# Patient Record
Sex: Male | Born: 1990 | Race: Black or African American | Hispanic: No | Marital: Single | Smoking: Never smoker
Health system: Southern US, Community
[De-identification: ages and names within clinical notes are randomized; demographics above are authoritative.]

## PROBLEM LIST (undated history)

## (undated) DIAGNOSIS — F840 Autistic disorder: Secondary | ICD-10-CM

## (undated) DIAGNOSIS — R569 Unspecified convulsions: Secondary | ICD-10-CM

## (undated) DIAGNOSIS — F419 Anxiety disorder, unspecified: Secondary | ICD-10-CM

---
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
## 2010-08-11 LAB — COMPREHENSIVE METABOLIC PANEL
ALT: 14 U/L (ref 0–35)
Alkaline Phosphatase: 77 U/L (ref 39–117)
BUN: 8 mg/dL (ref 6–23)
Calcium: 9.4 mg/dL (ref 8.4–10.5)
Chloride: 107 mEq/L (ref 96–112)
Creatinine, Ser: 0.64 mg/dL (ref 0.4–1.2)
Glucose, Bld: 90 mg/dL (ref 70–99)
Potassium: 4.1 mEq/L (ref 3.5–5.1)
Sodium: 139 mEq/L (ref 135–145)

## 2014-12-11 NOTE — H&P (Signed)
  Paul Hendricks is an 24 y.o. male.   Chief Complaint: "Wisdom Teeth" HPI: Paul Hendricks is a 24 year old male with a history of Autism referred to our office to have his impacted wisdom teeth removed.    PMHx: No past medical history on file.  Mood Disorder, Autism, and Asphasia   PSx: No past surgical history on file.  Family Hx: No family history on file.  Social History:  has no tobacco, alcohol, and drug history on file.  Allergies: Allergies not on file  Meds:  No prescriptions prior to admission    Labs: No results found for this or any previous visit (from the past 48 hour(s)).  Radiology: No results found.  ROS: Pertinent items are noted in HPI.  Vitals: There were no vitals taken for this visit.  Physical Exam: General appearance: alert, appears stated age, mild distress, slowed mentation and uncooperative Head: Normocephalic, without obvious abnormality, atraumatic Eyes: conjunctivae/corneas clear. PERRL, EOM's intact. Fundi benign. Ears: normal TM's and external ear canals both ears Nose: Nares normal. Septum midline. Mucosa normal. No drainage or sinus tenderness. Throat: lips, mucosa, and tongue normal; teeth and gums normal and impacted third molars #17 and #32.  non-functional #1 and #16 Resp: clear to auscultation bilaterally Cardio: regular rate and rhythm, S1, S2 normal, no murmur, click, rub or gallop Extremities: extremities normal, atraumatic, no cyanosis or edema Skin: Skin color, texture, turgor normal. No rashes or lesions Neurologic: Alert and oriented X 3, normal strength and tone. Normal symmetric reflexes. Normal coordination and gait The examination was limited due to the patient's level of cooperation.  Assessment/Plan Paul Hendricks is a 24 year old male with Autism with non-functional #1, 16 and partial bony impacted #17, 32.  We will take the patient to the OR for removal of teeth #1, 16, 17, and #32.    [place],Sheanna Dail L  12/11/2014, 4:44  PM

## 2014-12-19 ENCOUNTER — Encounter (HOSPITAL_COMMUNITY)
Admission: RE | Admit: 2014-12-19 | Discharge: 2014-12-19 | Disposition: A | Discharge status: Home / Self Care | Payer: Medicaid Other | Source: Ambulatory Visit | Attending: Oral and Maxillofacial Surgery | Admitting: Oral and Maxillofacial Surgery

## 2014-12-19 ENCOUNTER — Encounter (HOSPITAL_COMMUNITY): Payer: Self-pay

## 2014-12-19 DIAGNOSIS — Z01818 Encounter for other preprocedural examination: Secondary | ICD-10-CM | POA: Diagnosis present

## 2014-12-19 HISTORY — DX: Unspecified convulsions: R56.9

## 2014-12-19 HISTORY — DX: Anxiety disorder, unspecified: F41.9

## 2014-12-19 HISTORY — DX: Autistic disorder: F84.0

## 2014-12-19 NOTE — Pre-Procedure Instructions (Signed)
Rashun Grattan  12/19/2014     No Pharmacies Listed   Your procedure is scheduled on Monday, August 1st.   Report to [facility] Admitting at 5:30 AM   Call this number if you have problems the morning of surgery:  [phone]   Remember:  Do not eat food or drink liquids after midnight Sunday.  Take these medicines the morning of surgery with A SIP OF WATER     Do not wear jewelry - no rings or watches.  Do not wear lotions or colognes.   You may NOT wear deodorant the day of surgery.             Men may shave face and neck.   Do not bring valuables to the hospital.  [facility] is not responsible for any belongings or valuables.  Contacts, dentures or bridgework may not be worn into surgery.  Leave your suitcase in the car.  After surgery it may be brought to your room.  Patients discharged the day of surgery will not be allowed to drive home.   Name and phone number of your driver:     Special instructions:  "Preparing for Surgery" instruction sheet.  Please read over the following fact sheets that you were given. Pain Booklet and Surgical Site Infection Prevention

## 2014-12-19 NOTE — Progress Notes (Addendum)
Patient is at [place] [facility] [place]. Has Autism, behavioral problems, can become somewhat combative.  We attempted to get blood sample for CBC, however, patient was resistent.  Caretakers: [name] (she will be with patient on DOS) and Mr. Marilu Favre [phone]) Mother is Conard Novak [phone]) and Father Curt Jews [phone]) Parents have guardianship of [place]. I have tried calling Ms. Torres and left a message for her to call back so we may get a verbal consent for surgery.   Ms. Caralee Ates stated that the mother is aware of upcoming surgery and  will be coming.   Dentist is Dr. Billey Chang PCP is Dr. Tally Joe [phone] Lindaann Pascal, PA last saw patient 06/2014 I have gathered various info, such as drug list, "individual support plan", "Letter of Appointment Guardian of the Person", Referral [facility]. I am attempting to get H & P from Dr. Dagoberto Reef office, spoke with Elmarie Shiley who is faxing last H & P.  3:05 pm

## 2014-12-21 NOTE — Op Note (Signed)
12/23/2014  4:49 PM  PATIENT:  Paul Hendricks  24 y.o. male  PRE-OPERATIVE DIAGNOSIS:  INSUFFICIENT SPACE FOR ERUPTION   POST-OPERATIVE DIAGNOSIS:  INSUFFICIENT SPACE FOR ERUPTION, NON-FUNCTIONAL /MALPOSITIONED #1, 16, PARTIAL BONY IMPACTED #17 AND #32  INDICATIONS FOR PROCEDURE: The patient is a 24 year old male with a history significant for Autism.  The patient was referred for extraction of third molars.  Due to the patient's level of cooperation he will require general anesthesia in a hospital setting.  PROCEDURE:  Procedure(s): EXTRACTION  OF TEETH [phone]  SURGEON:  Surgeon(s): [place], DDS  PHYSICIAN ASSISTANT: NONE  ASSISTANTS: MARY BETH COX   ANESTHESIA:   general   PROCEDURE IN DETAIL: The patient was seen in the preoperative area. All questions were answered the history and physical was updated and verified.  The consent was reviewed and signed .  The patient was taken to the operating room by the anesthesia service.   Patient was placed on the table in the supine position and nasally intubated. The patient was prepped and draped in the usual sterile fashion for all maxillofacial surgery procedures.  A moisten raytec was placed in the patient oropharynx.  Next, 6 carpules of 2% Lidocaine with 1:100,000 epinephrine and 4 carpules of 0.5% Lidocaine with 1:200,000 epinephrine was then placed bilaterally at the inferior alveolar, long buccal nerves, greater palatine nerves.  Local was also infiltrated along the buccal of #1 and #16 as well.   Next, a 15 blade was used to make a full thickness  mucoperiosteal flap along the sulcus of teeth #'s #1, 16, 17, 32 with distal releases.   Next, a periosteal elevator was used to raise the flaps.  Next a bur was used to remove bone peripherally around #17 and #32.  A hand instrument was used to remove bone around #1 and 16.  Teeth #'s 17 and 32 were sectioned and removed with an elevator.  Teeth #'s 1 and 16 were not  sectioned, but removed with an elevator.  Copious irrigation with normal saline was performed and then 3.0 chromic was used to close the wounds x 4 after packing gel foam in each site.  All counts were correct times two.  Patient was extubated and taken to the PACU were he recovered well.  EBL:  Minimal  DRAINS: none   LOCAL MEDICATIONS USED:  0.5% MARCAINE with 1:200,000 epinephrine and 2% LIDOCAINE with 1:100,000 epinephrine  SPECIMEN:  No Specimen  DISPOSITION OF SPECIMEN:  N/A  COUNTS:  YES  PLAN OF CARE: Discharge to home after PACU  PATIENT DISPOSITION:  PACU - hemodynamically stable.   Delay start of Pharmacological VTE agent (>24hrs) due to surgical blood loss or risk of bleeding:  not applicable

## 2014-12-22 MED ORDER — CEFAZOLIN SODIUM-DEXTROSE 2-3 GM-% IV SOLR
2.0000 g | INTRAVENOUS | Status: AC
  Administered 2014-12-23: 2 g via INTRAVENOUS
  Filled 2014-12-22: qty 50

## 2014-12-23 ENCOUNTER — Encounter (HOSPITAL_COMMUNITY)
Admission: RE | Disposition: A | Discharge status: Home / Self Care | Payer: Self-pay | Source: Ambulatory Visit | Attending: Oral and Maxillofacial Surgery

## 2014-12-23 ENCOUNTER — Ambulatory Visit (HOSPITAL_COMMUNITY): Payer: Medicaid Other | Admitting: Anesthesiology

## 2014-12-23 ENCOUNTER — Encounter (HOSPITAL_COMMUNITY): Payer: Self-pay | Admitting: General Practice

## 2014-12-23 ENCOUNTER — Ambulatory Visit (HOSPITAL_COMMUNITY)
Admission: RE | Admit: 2014-12-23 | Discharge: 2014-12-23 | Disposition: A | Discharge status: Home / Self Care | Payer: Medicaid Other | Source: Ambulatory Visit | Attending: Oral and Maxillofacial Surgery | Admitting: Oral and Maxillofacial Surgery

## 2014-12-23 DIAGNOSIS — K011 Impacted teeth: Secondary | ICD-10-CM | POA: Diagnosis not present

## 2014-12-23 DIAGNOSIS — M2630 Unspecified anomaly of tooth position of fully erupted tooth or teeth: Secondary | ICD-10-CM | POA: Diagnosis present

## 2014-12-23 DIAGNOSIS — R569 Unspecified convulsions: Secondary | ICD-10-CM | POA: Insufficient documentation

## 2014-12-23 DIAGNOSIS — F419 Anxiety disorder, unspecified: Secondary | ICD-10-CM | POA: Diagnosis not present

## 2014-12-23 DIAGNOSIS — F39 Unspecified mood [affective] disorder: Secondary | ICD-10-CM | POA: Diagnosis not present

## 2014-12-23 DIAGNOSIS — F840 Autistic disorder: Secondary | ICD-10-CM | POA: Diagnosis not present

## 2014-12-23 HISTORY — PX: TOOTH EXTRACTION: SHX859

## 2014-12-23 SURGERY — EXTRACTION, TOOTH, MOLAR
Anesthesia: General | Site: Mouth

## 2014-12-23 MED ORDER — SUCCINYLCHOLINE CHLORIDE 20 MG/ML IJ SOLN
INTRAMUSCULAR | Status: DC | PRN
  Administered 2014-12-23: 100 mg via INTRAVENOUS

## 2014-12-23 MED ORDER — LIDOCAINE HCL 2 % IJ SOLN
INTRAMUSCULAR | Status: AC
  Filled 2014-12-23: qty 20

## 2014-12-23 MED ORDER — ROCURONIUM BROMIDE 50 MG/5ML IV SOLN
INTRAVENOUS | Status: AC
  Filled 2014-12-23: qty 1

## 2014-12-23 MED ORDER — PROPOFOL 10 MG/ML IV BOLUS
INTRAVENOUS | Status: AC
  Filled 2014-12-23: qty 20

## 2014-12-23 MED ORDER — BUPIVACAINE-EPINEPHRINE (PF) 0.5% -1:200000 IJ SOLN
INTRAMUSCULAR | Status: AC
  Filled 2014-12-23: qty 10.8

## 2014-12-23 MED ORDER — LIDOCAINE HCL (PF) 2 % IJ SOLN
INTRAMUSCULAR | Status: DC | PRN
  Administered 2014-12-23: 10.2 mL via INTRADERMAL

## 2014-12-23 MED ORDER — LIDOCAINE HCL (CARDIAC) 20 MG/ML IV SOLN
INTRAVENOUS | Status: AC
  Filled 2014-12-23: qty 5

## 2014-12-23 MED ORDER — BUPIVACAINE-EPINEPHRINE (PF) 0.5% -1:200000 IJ SOLN
INTRAMUSCULAR | Status: AC
  Filled 2014-12-23: qty 30

## 2014-12-23 MED ORDER — ONDANSETRON HCL 4 MG/2ML IJ SOLN
INTRAMUSCULAR | Status: AC
  Filled 2014-12-23: qty 2

## 2014-12-23 MED ORDER — MIDAZOLAM HCL 2 MG/ML PO SYRP
ORAL_SOLUTION | ORAL | Status: AC
  Administered 2014-12-23: 20 mg
  Filled 2014-12-23: qty 10

## 2014-12-23 MED ORDER — KETAMINE HCL 100 MG/ML IJ SOLN
INTRAMUSCULAR | Status: AC
  Filled 2014-12-23: qty 1

## 2014-12-23 MED ORDER — LIDOCAINE-EPINEPHRINE 2 %-1:100000 IJ SOLN
INTRAMUSCULAR | Status: AC
  Filled 2014-12-23: qty 17

## 2014-12-23 MED ORDER — FENTANYL CITRATE (PF) 250 MCG/5ML IJ SOLN
INTRAMUSCULAR | Status: AC
  Filled 2014-12-23: qty 5

## 2014-12-23 MED ORDER — PROPOFOL 10 MG/ML IV BOLUS
INTRAVENOUS | Status: DC | PRN
  Administered 2014-12-23: 50 mg via INTRAVENOUS

## 2014-12-23 MED ORDER — ONDANSETRON HCL 4 MG/2ML IJ SOLN
INTRAMUSCULAR | Status: DC | PRN
  Administered 2014-12-23: 4 mg via INTRAVENOUS

## 2014-12-23 MED ORDER — MIDAZOLAM HCL 2 MG/2ML IJ SOLN
INTRAMUSCULAR | Status: AC
  Filled 2014-12-23: qty 2

## 2014-12-23 MED ORDER — GELATIN ABSORBABLE 12-7 MM EX MISC
CUTANEOUS | Status: DC | PRN
  Administered 2014-12-23: 1

## 2014-12-23 MED ORDER — LACTATED RINGERS IV SOLN
INTRAVENOUS | Status: DC | PRN
  Administered 2014-12-23 (×2): via INTRAVENOUS

## 2014-12-23 MED ORDER — ARTIFICIAL TEARS OP OINT
TOPICAL_OINTMENT | OPHTHALMIC | Status: AC
  Filled 2014-12-23: qty 3.5

## 2014-12-23 MED ORDER — BUPIVACAINE-EPINEPHRINE 0.5% -1:200000 IJ SOLN
INTRAMUSCULAR | Status: DC | PRN
  Administered 2014-12-23: 7.2 mL

## 2014-12-23 MED ORDER — FENTANYL CITRATE (PF) 100 MCG/2ML IJ SOLN
INTRAMUSCULAR | Status: DC | PRN
  Administered 2014-12-23: 25 ug via INTRAVENOUS

## 2014-12-23 MED ORDER — 0.9 % SODIUM CHLORIDE (POUR BTL) OPTIME
TOPICAL | Status: DC | PRN
  Administered 2014-12-23: 1000 mL

## 2014-12-23 SURGICAL SUPPLY — 37 items
ALCOHOL ISOPROPYL (RUBBING) (MISCELLANEOUS) ×3 IMPLANT
ATTRACTOMAT 16X20 MAGNETIC DRP (DRAPES) ×3 IMPLANT
BLADE SURG 15 STRL LF DISP TIS (BLADE) ×2 IMPLANT
BLADE SURG 15 STRL SS (BLADE) ×6
BNDG COHESIVE 4X5 TAN STRL (GAUZE/BANDAGES/DRESSINGS) ×3 IMPLANT
BUR CROSS CUT FISSURE 1.6 (BURR) ×2 IMPLANT
BUR CROSS CUT FISSURE 1.6MM (BURR) ×1
BUR RND FLUTED 2.5 (BURR) ×3 IMPLANT
CANISTER SUCTION 2500CC (MISCELLANEOUS) ×3 IMPLANT
COVER SURGICAL LIGHT HANDLE (MISCELLANEOUS) ×3 IMPLANT
ELECT COATED BLADE 2.86 ST (ELECTRODE) IMPLANT
GAUZE PACKING FOLDED 2  STR (GAUZE/BANDAGES/DRESSINGS) ×2
GAUZE PACKING FOLDED 2 STR (GAUZE/BANDAGES/DRESSINGS) ×1 IMPLANT
GAUZE SPONGE 4X4 12PLY STRL (GAUZE/BANDAGES/DRESSINGS) IMPLANT
GAUZE SPONGE 4X4 16PLY XRAY LF (GAUZE/BANDAGES/DRESSINGS) ×3 IMPLANT
GLOVE BIOGEL PI IND STRL 6.5 (GLOVE) ×1 IMPLANT
GLOVE BIOGEL PI IND STRL 7.5 (GLOVE) ×1 IMPLANT
GLOVE BIOGEL PI INDICATOR 6.5 (GLOVE) ×2
GLOVE BIOGEL PI INDICATOR 7.5 (GLOVE) ×2
GLOVE ORTHO TXT STRL SZ7.5 (GLOVE) ×3 IMPLANT
GLOVE SURG SS PI 6.0 STRL IVOR (GLOVE) ×3 IMPLANT
GOWN STRL REUS W/ TWL LRG LVL3 (GOWN DISPOSABLE) ×3 IMPLANT
GOWN STRL REUS W/TWL LRG LVL3 (GOWN DISPOSABLE) ×9
KIT BASIN OR (CUSTOM PROCEDURE TRAY) ×3 IMPLANT
KIT ROOM TURNOVER OR (KITS) ×3 IMPLANT
NEEDLE BLUNT 16X1.5 OR ONLY (NEEDLE) ×3 IMPLANT
NEEDLE DENTAL 27 LONG (NEEDLE) ×6 IMPLANT
NS IRRIG 1000ML POUR BTL (IV SOLUTION) ×3 IMPLANT
PAD ARMBOARD 7.5X6 YLW CONV (MISCELLANEOUS) ×6 IMPLANT
PENCIL BUTTON HOLSTER BLD 10FT (ELECTRODE) IMPLANT
SOLUTION BETADINE 4OZ (MISCELLANEOUS) ×3 IMPLANT
SPONGE GAUZE 4X4 12PLY STER LF (GAUZE/BANDAGES/DRESSINGS) ×3 IMPLANT
SUT CHROMIC 3 0 PS 2 (SUTURE) ×3 IMPLANT
SYR 50ML SLIP (SYRINGE) ×3 IMPLANT
TOOTHBRUSH ADULT (PERSONAL CARE ITEMS) ×3 IMPLANT
TOWEL OR 17X24 6PK STRL BLUE (TOWEL DISPOSABLE) ×3 IMPLANT
TRAY ENT MC OR (CUSTOM PROCEDURE TRAY) ×3 IMPLANT

## 2014-12-23 NOTE — Discharge Instructions (Signed)
HOME CARE INSTRUCTIONS °DENTAL PROCEDURES ° °MEDICATION: °Some soreness and discomfort is normal following a dental procedure.  Use of a non-aspirin pain product, like acetaminophen, is recommended.  If pain is not relieved, please call the surgeon who performed the procedure. ° °ORAL HYGIENE: °Brushing of the teeth should be resumed the day after surgery.  Begin slowly and softly.  In children, brushing should be done by the parent after every meal. ° °DIET: °A balanced diet is very important during the healing process.   Liquids and soft foods are advisable.  Drink clear liquids at first, then progress to other liquids as tolerated.  If teeth were removed, do not use a straw for at least 2 days.  Try to limit between-meal snacks which are high in sugar. ° °ACTIVITY: °Limit to quiet indoor activities for 24 hours following surgery. ° °RETURN TO SCHOOL OR WORK: °You may return to school or work in a day or two, or as indicated by your dentist. ° °GENERAL EXPECTATIONS: ° -Bleeding is to be expected after teeth are removed.  The bleeding should slow down after several hours. ° -Stitches may be in place, which will fall out by themselves.  If the child pulls them out, do not be concerned. ° °CALL YOUR DOCTOR IS THESE OCCUR: ° -Temperature is 101 degrees or more. ° -Persistent bright red bleeding. ° -Severe pain. ° °Return to the doctor's office as needed. °Call to make an appointment. ° °Patient Signature:  ________________________________________________________ ° °Nurse's Signature:  ________________________________________________________ ° °

## 2014-12-23 NOTE — Progress Notes (Signed)
Pt. Was unable to have labs drawn in PAT appointment.  Notified anesthesiologist, Dr. Ivin Booty morning of surgery and confirmed that patient did not need to have labs drawn prior to surgery.

## 2014-12-23 NOTE — Interval H&P Note (Signed)
History and Physical Interval Note:  12/23/2014 7:30 AM  Paul Hendricks  has presented today for surgery, with the diagnosis of INSUFFICIENT SPACE FOR ERUPTION   The various methods of treatment have been discussed with the patient and family. After consideration of risks, benefits and other options for treatment, the patient has consented to  Procedure(s): EXTRACTION  OF TEETH [phone] (N/A) as a surgical intervention .  The patient's history has been reviewed, patient examined, no change in status, stable for surgery.  I have reviewed the patient's chart and labs.  Questions were answered to the patient's satisfaction.     [place],Makylah Bossard L

## 2014-12-23 NOTE — Anesthesia Preprocedure Evaluation (Addendum)
Anesthesia Evaluation  Patient identified by MRN, date of birth, ID band Patient awake    Reviewed: Allergy & Precautions, NPO status   Airway Mallampati: II  TM Distance: >3 FB Neck ROM: Full    Dental  (+) Teeth Intact, Poor Dentition, Dental Advisory Given   Pulmonary    Pulmonary exam normal       Cardiovascular Normal cardiovascular exam    Neuro/Psych Seizures -,  Anxiety    GI/Hepatic   Endo/Other    Renal/GU      Musculoskeletal   Abdominal   Peds  Hematology   Anesthesia Other Findings   Reproductive/Obstetrics                            Anesthesia Physical Anesthesia Plan  ASA: III  Anesthesia Plan: General   Post-op Pain Management:    Induction: Inhalational  Airway Management Planned: Nasal ETT  Additional Equipment:   Intra-op Plan:   Post-operative Plan: Extubation in OR  Informed Consent: I have reviewed the patients History and Physical, chart, labs and discussed the procedure including the risks, benefits and alternatives for the proposed anesthesia with the patient or authorized representative who has indicated his/her understanding and acceptance.     Plan Discussed with: CRNA and Surgeon  Anesthesia Plan Comments:         Anesthesia Quick Evaluation

## 2014-12-23 NOTE — Anesthesia Procedure Notes (Signed)
Procedure Name: Intubation Date/Time: 12/23/2014 7:56 AM Performed by: De Nurse Pre-anesthesia Checklist: Patient identified, Emergency Drugs available, Suction available, Patient being monitored and Timeout performed Patient Re-evaluated:Patient Re-evaluated prior to inductionOxygen Delivery Method: Circle system utilized Preoxygenation: Pre-oxygenation with 100% oxygen Intubation Type: Combination inhalational/ intravenous induction Ventilation: Mask ventilation without difficulty Laryngoscope Size: Mac and 4 Grade View: Grade I Nasal Tubes: Nasal Rae, Magill forceps- large, utilized and Right Tube size: 7.5 mm Number of attempts: 2 Placement Confirmation: ETT inserted through vocal cords under direct vision,  positive ETCO2 and breath sounds checked- equal and bilateral Secured at: 29 cm Tube secured with: Tape Dental Injury: Teeth and Oropharynx as per pre-operative assessment

## 2014-12-23 NOTE — Anesthesia Postprocedure Evaluation (Signed)
Anesthesia Post Note  Patient: Paul Hendricks  Procedure(s) Performed: Procedure(s) (LRB): EXTRACTION  OF TEETH [phone] (N/A)  Anesthesia type: general  Patient location: PACU  Post pain: Pain level controlled  Post assessment: Patient's Cardiovascular Status Stable  Last Vitals:  Filed Vitals:   12/23/14 0947  BP:   Pulse: 81  Temp: 36.8 C  Resp: 11    Post vital signs: Reviewed and stable  Level of consciousness: sedated  Complications: No apparent anesthesia complications

## 2014-12-23 NOTE — Transfer of Care (Signed)
Immediate Anesthesia Transfer of Care Note  Patient: Paul Hendricks  Procedure(s) Performed: Procedure(s): EXTRACTION  OF TEETH [phone] (N/A)  Patient Location: PACU  Anesthesia Type:General  Level of Consciousness: awake  Airway & Oxygen Therapy: Patient Spontanous Breathing  Post-op Assessment: Report given to RN  Post vital signs: Reviewed and stable  Last Vitals:  Filed Vitals:   12/23/14 0549  BP: 141/83  Pulse: 62  Resp: 16    Complications: No apparent anesthesia complications

## 2014-12-24 ENCOUNTER — Encounter (HOSPITAL_COMMUNITY): Payer: Self-pay | Admitting: Oral and Maxillofacial Surgery

## 2015-03-05 DIAGNOSIS — F319 Bipolar disorder, unspecified: Secondary | ICD-10-CM | POA: Insufficient documentation

## 2015-04-22 ENCOUNTER — Ambulatory Visit (INDEPENDENT_AMBULATORY_CARE_PROVIDER_SITE_OTHER): Payer: Medicaid Other | Admitting: Neurology

## 2015-04-22 ENCOUNTER — Encounter: Payer: Self-pay | Admitting: Neurology

## 2015-04-22 VITALS — BP 118/78 | HR 88 | Resp 16 | Wt 200.0 lb

## 2015-04-22 DIAGNOSIS — F72 Severe intellectual disabilities: Secondary | ICD-10-CM

## 2015-04-22 DIAGNOSIS — G40309 Generalized idiopathic epilepsy and epileptic syndromes, not intractable, without status epilepticus: Secondary | ICD-10-CM | POA: Insufficient documentation

## 2015-04-22 DIAGNOSIS — F840 Autistic disorder: Secondary | ICD-10-CM | POA: Insufficient documentation

## 2015-04-22 MED ORDER — DIVALPROEX SODIUM ER 500 MG PO TB24: ORAL_TABLET | ORAL | Status: DC

## 2015-04-22 NOTE — Patient Instructions (Signed)
1. Stop the Depakote 250mg  tablet 2. Increase Depakote 500mg  tablet: Take 2 tablets at bedtime 3. Check Depakote level in 2-3 weeks 4. Discuss medication changes with psychiatrist as well 5. Follow-up in 1 year, call for any changes  Seizure Precautions: 1. If medication has been prescribed for you to prevent seizures, take it exactly as directed.  Do not stop taking the medicine without talking to your doctor first, even if you have not had a seizure in a long time.   2. Avoid activities in which a seizure would cause danger to yourself or to others.  Don't operate dangerous machinery, swim alone, or climb in high or dangerous places, such as on ladders, roofs, or girders.  Do not drive unless your doctor says you may.  3. If you have any warning that you may have a seizure, lay down in a safe place where you can't hurt yourself.    4.  No driving for 6 months from last seizure, as [place] [place] [facility] [place].   Please refer to the following link on the [employer]ile[employer] of [employer]'s website for more information: http://www.epilepsyfoundation.org/answerplace/Social/driving/drivingu.cfm   5.  Maintain good sleep hygiene.  6.  Contact your doctor if you have any problems that may be related to the medicine you are taking.  7.  Call 911 and bring the patient back to the ED if:        A.  The seizure lasts longer than 5 minutes.       B.  The patient doesn't awaken shortly after the seizure  C.  The patient has new problems such as difficulty seeing, speaking or moving  D.  The patient was injured during the seizure  E.  The patient has a temperature over 102 F (39C)  F.  The patient vomited and now is having trouble breathing

## 2015-04-22 NOTE — Progress Notes (Signed)
NEUROLOGY CONSULTATION NOTE  [name] MRN: [id number] DOB: Oct 09, 1990  Referring provider: Marva Panda, FNP Primary care provider: Marva Panda, FNP  Reason for consult:  seizure  Thank you for your kind referral of [name] for consultation of the above symptoms. Although his history is well known to you, please allow me to reiterate it for the purpose of our medical record. The patient was accompanied to the clinic by group home staff who also provide collateral information. Records and images were personally reviewed where available.  HISTORY OF PRESENT ILLNESS: Paul Hendricks is a 24 year old man with severe intellectual deficits, autism spectrum disorder, mood disorder, and seizures since childhood. Staff does not know much about his seizure history, but mother reported seizures in childhood prior to moving to his current group home 10 years ago. Review of psychological evaluation records indicate that he started having language delays at age 79, and was diagnosed with autism. He can be very aggressive with self-injurious behavior (banging head repeatedly on wall) and needs one-on-one supervision at all times. He can point and sign a little to indicate his needs, he can feed himself but needs someone to cut his food. Group home staff have witnessed 2 seizures in the past 10 years. He had a seizure 5 years ago in the playground, he suddenly stiffened up for around 5-10 minutes, was brought to the ER and discharged home. He has been taking Depakote ER  (  tablet +  tablet) qhs with no changes, no side effects. He had another seizure last 03/04/15, staff reports he was very hyperactive that day, jumping up and down, then fell and started convulsing for a few minutes. No tongue bite or significant injuries. He saw his PCP the next day, bloodwork reviewed, CBC and CMP normal, Depakote level 48. Staff denies any missed medications, recent infections, or sleep pattern  changes.   Diagnostic Data: I personally reviewed head CT available from 07/2005 with indication of new onset seizure, unremarkable, no acute changes seen.  PAST MEDICAL HISTORY: Past Medical History  Diagnosis Date  . Anxiety   . Autism disorder     WITH BEHAVIORAL ISSUES  . Seizures (HCC)     LAST SEIZURE 5-6 YRS AGO    PAST SURGICAL HISTORY: Past Surgical History  Procedure Laterality Date  . Tooth extraction N/A 12/23/2014    Procedure: EXTRACTION  OF TEETH 6,57,84,69;  Surgeon: Lincoln Brigham, DDS;  [facility]: [facility] OR;  Service: Oral Surgery;  Laterality: N/A;    MEDICATIONS: Current Outpatient Prescriptions on File Prior to Visit  Medication Sig Dispense Refill  . clonazePAM (KLONOPIN) 0.5 MG tablet Take 0.5-1 mg by mouth 2 (two) times daily. Take 1 tablet every morning and 2 tablets at bedtime    . diazepam (VALIUM) 10 MG tablet Take 10 mg by mouth See admin instructions. Take  by mouth every 4 hours up to twice daily as needed for anxiety    . divalproex (DEPAKOTE ER) 250 MG 24 hr tablet Take 250 mg by mouth at bedtime.    . divalproex (DEPAKOTE ER) 500 MG 24 hr tablet Take 500 mg by mouth at bedtime.    . risperiDONE (RISPERDAL) 1 MG tablet Take 1-2 mg by mouth 2 (two) times daily. Take  by mouth in the morning and take  by mouth at bedtime    . traZODone (DESYREL) 100 MG tablet Take 100 mg by mouth at bedtime.     No current facility-administered medications on file prior to visit.  ALLERGIES: No Known Allergies  FAMILY HISTORY: No family history on file.  SOCIAL HISTORY: Social History   Social History  . Marital Status: Single    Spouse Name: N/A  . Number of Children: N/A  . Years of Education: N/A   Occupational History  . Not on file.   Social History Main Topics  . Smoking status: Never Smoker   . Smokeless tobacco: Never Used  . Alcohol Use: No  . Drug Use: No  . Sexual Activity: Not on file   Other Topics Concern  . Not on file     Social History Narrative    REVIEW OF SYSTEMS unable to obtain due to patient being non-verbal  PHYSICAL EXAM: Filed Vitals:   04/22/15 0912  BP: 118/78  Pulse: 88  Resp: 16   General: No acute distress, needs constant supervision, non-verbal, does not follow commands except to reach for pen and give it back to examiner Head:  Normocephalic/atraumatic Eyes: unable to do fundoscopy, no visible abnormalities Neck: supple, full range of motion Heart: regular rate and rhythm Lungs: Clear to auscultation bilaterally. Vascular: No carotid bruits. Skin/Extremities: No rash, no edema Neurological Exam: Mental status: awake, alert, non-verbal except for some vocalizations, does not follow commands except to reach for pen and give it back to examiner.  Cranial nerves: CN I: not tested CN II: pupils equal, round and reactive to light, +blink to threat bilaterally CN III, IV, VI:  full range of motion, no nystagmus, no ptosis CN V: unable to test CN VII: upper and lower face symmetric CN VIII: hearing appears intact to voice CN IX, X: unable to test CN XII: tongue midline Bulk & Tone: slightly increased tone throughout, no fasciculations. Motor: unable to do formal muscle testing, moves all extremities symmetrically Sensation: withdraws to touch Cerebellar: reaches for object without ataxia Gait: narrow-based and steady Tremor: none  IMPRESSION: This is a 24 year old man with severe intellectual disability, autism spectrum disorder, mood/behavioral disorder, and seizures since childhood, presenting for breakthrough convulsion last 03/04/15. Previous to this, the last seizure was 5 years ago. His Depakote level was 48, there is room to increase dose to 1000mg  qhs. Staff reports Depakote is prescribed by his psychiatrist and they will discuss change with psychiatry as well. Recheck Depakote level in 2-3 weeks. He will follow-up in 1 year or earlier if needed.   Thank you for allowing  me to participate in the care of this patient. Please do not hesitate to call for any questions or concerns.   Patrcia DollyKaren Aquino, M.D.  CC: Marva PandaKimberly Millsaps, FNP

## 2015-10-23 ENCOUNTER — Encounter: Payer: Self-pay | Admitting: Family Medicine

## 2016-04-20 ENCOUNTER — Ambulatory Visit (INDEPENDENT_AMBULATORY_CARE_PROVIDER_SITE_OTHER): Payer: Medicaid Other | Admitting: Neurology

## 2016-04-20 ENCOUNTER — Encounter: Payer: Self-pay | Admitting: Neurology

## 2016-04-20 ENCOUNTER — Ambulatory Visit: Payer: Medicaid Other | Admitting: Neurology

## 2016-04-20 VITALS — HR 102 | Ht 74.5 in | Wt 219.4 lb

## 2016-04-20 DIAGNOSIS — F72 Severe intellectual disabilities: Secondary | ICD-10-CM | POA: Diagnosis not present

## 2016-04-20 DIAGNOSIS — F840 Autistic disorder: Secondary | ICD-10-CM | POA: Diagnosis not present

## 2016-04-20 DIAGNOSIS — G40309 Generalized idiopathic epilepsy and epileptic syndromes, not intractable, without status epilepticus: Secondary | ICD-10-CM

## 2016-04-20 MED ORDER — DIVALPROEX SODIUM ER 500 MG PO TB24: ORAL_TABLET | ORAL | 11 refills | Status: DC

## 2016-04-20 NOTE — Patient Instructions (Signed)
1. Continue Depakote ER 500mg : Take 2 tablets at night 2. Bloodwork for Depakote level, CBC, CMP 3. Follow-up in 1 year, call for any changes

## 2016-04-20 NOTE — Progress Notes (Signed)
NEUROLOGY FOLLOW UP OFFICE NOTE  Paul Hendricks [id number]  HISTORY OF PRESENT ILLNESS: I had the pleasure of seeing Paul PasturesGabriel Hendricks in follow-up in the neurology clinic on 04/20/2016.  He is again accompanied by group home staff who supplement the history today as the patient is non-verbal. He was last seen a year ago after a breakthrough seizure on 03/04/15 after being seizure-free for 5 years. His Depakote dose was increased to 1000mg  qhs. Repeat bloodwork has not yet been done due to patient behavior (biting and aggressive). Staff denies any further seizures. No side effects on higher dose Depakote, they deny any drowsiness. His behavior has been worsening, he has always been biting, but this has gotten worse. He has a psychiatrist at the facility. Appetite is good. He needs assistance with all ADLs.   HPI 04/22/2015: Paul Hendricks is a 25 year old man with severe intellectual deficits, autism spectrum disorder, mood disorder, and seizures since childhood. Staff does not know much about his seizure history, but mother reported seizures in childhood prior to moving to his current group home 10 years ago. Review of psychological evaluation records indicate that he started having language delays at age 32, and was diagnosed with autism. He can be very aggressive with self-injurious behavior (banging head repeatedly on wall) and needs one-on-one supervision at all times. He can point and sign a little to indicate his needs, he can feed himself but needs someone to cut his food. Group home staff have witnessed 2 seizures in the past 10 years. He had a seizure 5 years ago in the playground, he suddenly stiffened up for around 5-10 minutes, was brought to the ER and discharged home. He has been taking Depakote ER 750mg  (250mg  tablet + 500mg  tablet) qhs with no changes, no side effects. He had another seizure last 03/04/15, staff reports he was very hyperactive that day, jumping up and down, then fell and started  convulsing for a few minutes. No tongue bite or significant injuries. He saw his PCP the next day, bloodwork reviewed, CBC and CMP normal, Depakote level 48. Staff denies any missed medications, recent infections, or sleep pattern changes.   Diagnostic Data: I personally reviewed head CT available from 07/2005 with indication of new onset seizure, unremarkable, no acute changes seen.  PAST MEDICAL HISTORY: Past Medical History:  Diagnosis Date  . Anxiety   . Autism disorder    WITH BEHAVIORAL ISSUES  . Seizures (HCC)    LAST SEIZURE 5-6 YRS AGO    MEDICATIONS: Current Outpatient Prescriptions on File Prior to Visit  Medication Sig Dispense Refill  . clonazePAM (KLONOPIN) 0.5 MG tablet Take 0.5-1 mg by mouth 2 (two) times daily. Take 1 tablet every morning and 2 tablets at bedtime    . diazepam (VALIUM) 10 MG tablet Take 10 mg by mouth See admin instructions. Take 10mg  by mouth every 4 hours up to twice daily as needed for anxiety    . divalproex (DEPAKOTE ER) 500 MG 24 hr tablet Take 2 tablets at night 60 tablet 11  . risperiDONE (RISPERDAL) 1 MG tablet Take 1-2 mg by mouth 2 (two) times daily. Take 1mg  by mouth in the morning and take 2mg  by mouth at bedtime    . traZODone (DESYREL) 100 MG tablet Take 100 mg by mouth at bedtime.     No current facility-administered medications on file prior to visit.     ALLERGIES: No Known Allergies  FAMILY HISTORY: No family history on file.  SOCIAL HISTORY:  Social History   Social History  . Marital status: Single    Spouse name: N/A  . Number of children: N/A  . Years of education: N/A   Occupational History  . Not on file.   Social History Main Topics  . Smoking status: Never Smoker  . Smokeless tobacco: Never Used  . Alcohol use No  . Drug use: No  . Sexual activity: Not on file   Other Topics Concern  . Not on file   Social History Narrative  . No narrative on file    REVIEW OF SYSTEMS unable to obtain due to  patient's mental status (non-verbal)  PHYSICAL EXAM: Vitals:   04/20/16 1101  Pulse: (!) 102   General: No acute distress, needs constant supervision due to aggression/biting, non-verbal, does not follow commands Head:  Normocephalic/atraumatic Eyes: unable to do fundoscopy, no visible abnormalities Skin/Extremities: No rash, no edema Neurological Exam: Mental status: awake, alert, non-verbal except for some vocalizations, does not follow commands Cranial nerves: CN I: not tested CN II: pupils equal, round CN III, IV, VI:  full range of motion, no nystagmus, no ptosis CN V: unable to test CN VII: upper and lower face symmetric CN VIII: hearing appears intact to voice CN IX, X: unable to test CN XII: tongue midline Bulk & Tone: slightly increased tone throughout, no fasciculations. Motor: unable to do formal muscle testing, moves all extremities symmetrically Sensation: withdraws to touch Cerebellar: no ataxia noted Gait: wide-based Tremor: none  IMPRESSION: This is a 25 year old man with severe intellectual disability, autism spectrum disorder, mood/behavioral disorder, and seizures since childhood, who had a breakthrough convulsion last 03/04/15. Previous to this, the last seizure was 5 years prior. His Depakote level was 48. Depakote ER dose increased to 1000mg  qhs. No further seizures in the past year. Safety labs with CBC, CMP, and Depakote level will be ordered. Staff's main concern is worsening behavior, he has a psychiatrist at the facility. He will follow-up in 1 year or earlier if needed  Thank you for allowing me to participate in his care.  Please do not hesitate to call for any questions or concerns.  The duration of this appointment visit was 15 minutes of face-to-face time with the patient.  Greater than 50% of this time was spent in counseling, explanation of diagnosis, planning of further management, and coordination of care.   Patrcia DollyKaren Renan Danese, M.D.   CC: Marva PandaKimberly  Millsaps, NP

## 2016-04-21 ENCOUNTER — Telehealth: Payer: Self-pay | Admitting: Neurology

## 2016-04-21 NOTE — Telephone Encounter (Signed)
Contacted [name]. She states patient had a seizure yesterday around 6:00 pm. She states he passed out and fell down. They poured water on him and he came out of it. She did give him his seizure medication at that time. He woke up this morning a little weak but seems to be doing better now.

## 2016-04-21 NOTE — Telephone Encounter (Signed)
[name] called in regards to PT, said he had an appointment with Dr Karel JarvisAquino yesterday and he had a seizure yesterday/Dawn CB# [phone]

## 2016-04-21 NOTE — Telephone Encounter (Signed)
Let's go ahead with bloodwork for Depakote level, no changes for now. Thanks

## 2016-04-22 NOTE — Telephone Encounter (Signed)
[name] notified

## 2016-04-26 ENCOUNTER — Other Ambulatory Visit: Payer: Self-pay | Admitting: Neurology

## 2016-04-29 LAB — CBC WITH DIFFERENTIAL
BASOS ABS: 0 10*3/uL (ref 0.0–0.2)
Basos: 1 %
EOS (ABSOLUTE): 0.5 10*3/uL — ABNORMAL HIGH (ref 0.0–0.4)
Eos: 7 %
HEMOGLOBIN: 14.3 g/dL (ref 13.0–17.7)
Hematocrit: 40.8 % (ref 37.5–51.0)
Immature Grans (Abs): 0 10*3/uL (ref 0.0–0.1)
Immature Granulocytes: 0 %
LYMPHS ABS: 2.7 10*3/uL (ref 0.7–3.1)
Lymphs: 37 %
MCH: 31 pg (ref 26.6–33.0)
MCHC: 35 g/dL (ref 31.5–35.7)
MCV: 88 fL (ref 79–97)
MONOCYTES: 6 %
MONOS ABS: 0.4 10*3/uL (ref 0.1–0.9)
NEUTROS ABS: 3.6 10*3/uL (ref 1.4–7.0)
Neutrophils: 49 %
RBC: 4.62 x10E6/uL (ref 4.14–5.80)
RDW: 12.9 % (ref 12.3–15.4)
WBC: 7.3 10*3/uL (ref 3.4–10.8)

## 2016-04-29 LAB — COMPREHENSIVE METABOLIC PANEL
ALBUMIN: 4.5 g/dL (ref 3.5–5.5)
ALK PHOS: 68 IU/L (ref 39–117)
ALT: 78 IU/L — ABNORMAL HIGH (ref 0–44)
AST: 41 IU/L — ABNORMAL HIGH (ref 0–40)
Albumin/Globulin Ratio: 1.6 (ref 1.2–2.2)
BILIRUBIN TOTAL: 0.2 mg/dL (ref 0.0–1.2)
BUN/Creatinine Ratio: 8 — ABNORMAL LOW (ref 9–20)
BUN: 7 mg/dL (ref 6–20)
CHLORIDE: 100 mmol/L (ref 96–106)
CO2: 25 mmol/L (ref 18–29)
Calcium: 9.6 mg/dL (ref 8.7–10.2)
Creatinine, Ser: 0.9 mg/dL (ref 0.76–1.27)
GFR calc Af Amer: 138 mL/min/{1.73_m2} (ref >59)
GFR calc non Af Amer: 119 mL/min/{1.73_m2} (ref >59)
GLOBULIN, TOTAL: 2.9 g/dL (ref 1.5–4.5)
GLUCOSE: 98 mg/dL (ref 65–99)
Potassium: 4.5 mmol/L (ref 3.5–5.2)
SODIUM: 143 mmol/L (ref 134–144)
Total Protein: 7.4 g/dL (ref 6.0–8.5)

## 2016-04-29 LAB — VALPROIC ACID LEVEL: VALPROIC ACID LVL: 13 ug/mL — AB (ref 50–100)

## 2016-05-04 ENCOUNTER — Telehealth: Payer: Self-pay

## 2016-05-04 NOTE — Telephone Encounter (Signed)
-----   Message from Van ClinesKaren M Aquino, MD sent at 05/03/2016  4:31 PM EST ----- Pls call group home and let his nurse know the Depakote level is pretty low. Is he taking his medication? If yes, would increase Depakote to 1250mg  qhs and re-check a Depakote level in 2 weeks. Thanks

## 2016-05-04 NOTE — Telephone Encounter (Signed)
Notified [name]. She states they will increase to 1250mg  at night and have his level rechecked in 2 weeks. New RX called in to pharmacy.

## 2017-01-12 DIAGNOSIS — E781 Pure hyperglyceridemia: Secondary | ICD-10-CM | POA: Insufficient documentation

## 2017-04-19 ENCOUNTER — Ambulatory Visit (INDEPENDENT_AMBULATORY_CARE_PROVIDER_SITE_OTHER): Payer: Medicaid Other | Admitting: Neurology

## 2017-04-19 DIAGNOSIS — G40309 Generalized idiopathic epilepsy and epileptic syndromes, not intractable, without status epilepticus: Secondary | ICD-10-CM

## 2017-04-19 MED ORDER — DIVALPROEX SODIUM ER 500 MG PO TB24: ORAL_TABLET | ORAL | 11 refills | Status: DC

## 2017-04-19 MED ORDER — DIVALPROEX SODIUM ER 250 MG PO TB24: ORAL_TABLET | ORAL | 11 refills | Status: DC

## 2017-04-19 NOTE — Patient Instructions (Signed)
1. Check Depakote level 2. Continue Depakote ER 500mg , take 2 tablets at night 3. Continue Depakote ER 250mg , take 1 tablet at night 4. Follow-up in 1 year, call for any changes  Seizure Precautions: 1. If medication has been prescribed for you to prevent seizures, take it exactly as directed.  Do not stop taking the medicine without talking to your doctor first, even if you have not had a seizure in a long time.   2. Avoid activities in which a seizure would cause danger to yourself or to others.  Don't operate dangerous machinery, swim alone, or climb in high or dangerous places, such as on ladders, roofs, or girders.  Do not drive unless your doctor says you may.  3. If you have any warning that you may have a seizure, lay down in a safe place where you can't hurt yourself.    4.  No driving for 6 months from last seizure, as [place].   Please refer to the following link on the [employer]'s website for more information: http://www.epilepsyfoundation.org/answerplace/Social/driving/drivingu.cfm   5.  Maintain good sleep hygiene. Avoid alcohol.  6.  Contact your doctor if you have any problems that may be related to the medicine you are taking.  7.  Call 911 and bring the patient back to the ED if:        A.  The seizure lasts longer than 5 minutes.       B.  The patient doesn't awaken shortly after the seizure  C.  The patient has new problems such as difficulty seeing, speaking or moving  D.  The patient was injured during the seizure  E.  The patient has a temperature over 102 F (39C)  F.  The patient vomited and now is having trouble breathing

## 2017-04-19 NOTE — Progress Notes (Signed)
NEUROLOGY FOLLOW UP OFFICE NOTE  Paul Hendricks [id number]  DOB: [birth year]  HISTORY OF PRESENT ILLNESS: I had the pleasure of seeing Paul Hendricks in follow-up in the neurology clinic on 04/19/2017.  He is again accompanied by group home staff who supplement the history today as the patient is non-verbal. He was last seen 26 year ago for seizures. He had been seizure-free for 5 years, until a breakthrough seizure in October 2016. Depakote dose increased to 1000mg  qhs. After his last visit, staff called to report a seizure on 04/20/16 where he passed out and fell down. Depakote level was 13. Instructed to increase dose to 1250mg  qhs. Staff reports another convulsion in July, he was standing then started shaking for a few minutes. They lay him down. They report "petit ones" where he would be staring off occurring around once a month. They report behavior is good, but he has his moments of biting. Appetite is good. He needs assistance with all ADLs.   HPI 04/22/2015: [name] is a 26 year old man with severe intellectual deficits, autism spectrum disorder, mood disorder, and seizures since childhood. Staff does not know much about his seizure history, but mother reported seizures in childhood prior to moving to his current group home 10 years ago. Review of psychological evaluation records indicate that he started having language delays at age 19, and was diagnosed with autism. He can be very aggressive with self-injurious behavior (banging head repeatedly on wall) and needs one-on-one supervision at all times. He can point and sign a little to indicate his needs, he can feed himself but needs someone to cut his food. Group home staff have witnessed 2 seizures in the past 10 years. He had a seizure 5 years ago in the playground, he suddenly stiffened up for around 5-10 minutes, was brought to the ER and discharged home. He has been taking Depakote ER 750mg  (250mg  tablet + 500mg  tablet) qhs with no  changes, no side effects. He had another seizure last 03/04/15, staff reports he was very hyperactive that day, jumping up and down, then fell and started convulsing for a few minutes. No tongue bite or significant injuries. He saw his PCP the next day, bloodwork reviewed, CBC and CMP normal, Depakote level 48. Staff denies any missed medications, recent infections, or sleep pattern changes.   Diagnostic Data: I personally reviewed head CT available from 07/2005 with indication of new onset seizure, unremarkable, no acute changes seen.  PAST MEDICAL HISTORY: Past Medical History:  Diagnosis Date  . Anxiety   . Autism disorder    WITH BEHAVIORAL ISSUES  . Seizures (HCC)    LAST SEIZURE 5-6 YRS AGO    MEDICATIONS:  Outpatient Encounter Medications as of 04/19/2017  Medication Sig  . atorvastatin (LIPITOR) 20 MG tablet Take 20 mg by mouth daily.  . clonazePAM (KLONOPIN) 0.5 MG tablet Take 0.5-1 mg by mouth 2 (two) times daily. Take 1 tablet every morning and 2 tablets at bedtime  . diazepam (VALIUM) 10 MG tablet Take 10 mg by mouth See admin instructions. Take 10mg  by mouth every 4 hours up to twice daily as needed for anxiety  . divalproex (DEPAKOTE ER) 500 MG 24 hr tablet Take 2 tablets at night (take with 250mg  tablet for a total of 1250mg  at bedtime)  . Melatonin 5 MG TABS Take by mouth.  . risperiDONE (RISPERDAL) 1 MG tablet Take 1-2 mg by mouth 2 (two) times daily. Take 1mg  by mouth in the morning and take  2mg  by mouth at bedtime  . traZODone (DESYREL) 100 MG tablet Take 100 mg by mouth at bedtime.  Marland Kitchen. zolpidem (AMBIEN) 10 MG tablet Take 10 mg by mouth at bedtime as needed for sleep.  .    .    . divalproex (DEPAKOTE ER) 250 MG 24 hr tablet Take 1 tablet at night (take with  2 500mg  tablets for a total of 1250mg  at night)  .     No facility-administered encounter medications on file as of 04/19/2017.     ALLERGIES: No Known Allergies  FAMILY HISTORY: No family history on  file.  SOCIAL HISTORY: Social History   Socioeconomic History  . Marital status: Single    Spouse name: Not on file  . Number of children: Not on file  . Years of education: Not on file  . Highest education level: Not on file  Social Needs  . Financial resource strain: Not on file  . Food insecurity - worry: Not on file  . Food insecurity - inability: Not on file  . Transportation needs - medical: Not on file  . Transportation needs - non-medical: Not on file  Occupational History  . Not on file  Tobacco Use  . Smoking status: Never Smoker  . Smokeless tobacco: Never Used  Substance and Sexual Activity  . Alcohol use: No    Alcohol/week: 0.0 oz  . Drug use: No  . Sexual activity: Not on file  Other Topics Concern  . Not on file  Social History Narrative  . Not on file    REVIEW OF SYSTEMS unable to obtain due to patient's mental status (non-verbal)  PHYSICAL EXAM: There were no vitals filed for this visit. Unable to obtain, patient started to get aggressive General: No acute distress, needs constant supervision due to aggression/biting, non-verbal, does not follow commands. He is lying on the examination table when I enter, tries to wave when addressed, smiles when staff sings Christmas songs Head:  Normocephalic/atraumatic Eyes: unable to do fundoscopy, no visible abnormalities Skin/Extremities: No rash, no edema Neurological Exam: Mental status: awake, alert, non-verbal except for some vocalizations, does not follow commands Cranial nerves: CN I: not tested CN II: pupils equal, round CN III, IV, VI:  full range of motion, no nystagmus, no ptosis CN V: unable to test CN VII: upper and lower face symmetric CN VIII: hearing appears intact to voice CN IX, X: unable to test CN XII: tongue midline Bulk & Tone: slightly increased tone throughout, no fasciculations. Motor: unable to do formal muscle testing, moves all extremities symmetrically Sensation: withdraws to  touch Cerebellar: no ataxia noted Gait: wide-based Tremor: none  IMPRESSION: This is a 26 year old man with severe intellectual disability, autism spectrum disorder, mood/behavioral disorder, and seizures since childhood, who had a breakthrough convulsion last 03/04/15. Previous to this, the last seizure was 5 years prior. Since then he has 2 more convulsions. Staff also reports staring.  He is on Depakote ER 1250mg  qhs, check Depakote level, we may further uptitrate to 1500mg  qhs if subtherapeutic. He will follow-up in 1 year or earlier if needed  Thank you for allowing me to participate in his care.  Please do not hesitate to call for any questions or concerns.  The duration of this appointment visit was 15 minutes of face-to-face time with the patient.  Greater than 50% of this time was spent in counseling, explanation of diagnosis, planning of further management, and coordination of care.   Patrcia DollyKaren Aquino, M.D.  CC: Marva PandaKimberly Millsaps, NP

## 2017-04-21 ENCOUNTER — Encounter: Payer: Self-pay | Admitting: Neurology

## 2017-07-18 ENCOUNTER — Telehealth: Payer: Self-pay | Admitting: Neurology

## 2017-07-18 DIAGNOSIS — G40309 Generalized idiopathic epilepsy and epileptic syndromes, not intractable, without status epilepticus: Secondary | ICD-10-CM

## 2017-07-18 MED ORDER — DIVALPROEX SODIUM ER 500 MG PO TB24: ORAL_TABLET | ORAL | 11 refills | Status: DC

## 2017-07-18 NOTE — Telephone Encounter (Signed)
Spoke with [name] who states that pt experienced a seizure over the weekend.  Other than that, [name] was not very forthcoming with information regarding the seizure.  The only thing she kept repeating was that pt refused to have blood draw for Depakote levels.  I advised that Dr. Karel JarvisAquino would like to increase pt's Depakote at this time.  [facility] me off stating "I cannot take directions over the phone, just send the prescription to the pharmacy"  Verified pharmacy with [name].  Will send in crease now.

## 2017-07-18 NOTE — Telephone Encounter (Signed)
Unfortunately, Paul Hendricks is not on pt's DPR nor listed as his emergency contact.  I will not return call to her.

## 2017-07-18 NOTE — Telephone Encounter (Signed)
Rx sent to pt's verified preferred pharmacy.

## 2017-07-18 NOTE — Telephone Encounter (Signed)
Re-evaluated pt's chart....  Dr. Karel JarvisAquino, if I remember correctly pt's mother does not speak English.  OK to call and speak with [name]?

## 2017-07-18 NOTE — Telephone Encounter (Signed)
[name] called and wanted to let Dr Karel JarvisAquino know pt had a seizure and wanted a call back concerning this

## 2017-07-18 NOTE — Telephone Encounter (Signed)
Pls ask for information about the seizure, what they witnessed. I doubt he missed doses, but was he sick or vomiting that he was unable to take meds? I had wanted to increase dose in the past, pls have her increase the Depakote ER 500mg  to 3 tabs at bedtime. He was taking an additional 250mg , stop the 250mg , just give the 500mg  tabs, but 3 instead of 2. Thanks

## 2017-07-18 NOTE — Addendum Note (Signed)
Addended by: Horatio PelOSTELLO, [place] Greb on: 07/18/2017 01:20 PM   Modules accepted: Orders

## 2018-02-03 ENCOUNTER — Emergency Department (HOSPITAL_COMMUNITY): Payer: Medicaid Other

## 2018-02-03 ENCOUNTER — Emergency Department (HOSPITAL_COMMUNITY)
Admission: EM | Admit: 2018-02-03 | Discharge: 2018-02-03 | Disposition: A | Discharge status: Home / Self Care | Payer: Medicaid Other | Attending: Emergency Medicine | Admitting: Emergency Medicine

## 2018-02-03 ENCOUNTER — Encounter (HOSPITAL_COMMUNITY): Payer: Self-pay | Admitting: *Deleted

## 2018-02-03 ENCOUNTER — Other Ambulatory Visit: Payer: Self-pay

## 2018-02-03 DIAGNOSIS — Y998 Other external cause status: Secondary | ICD-10-CM | POA: Diagnosis not present

## 2018-02-03 DIAGNOSIS — S0003XA Contusion of scalp, initial encounter: Secondary | ICD-10-CM | POA: Diagnosis not present

## 2018-02-03 DIAGNOSIS — Y92129 Unspecified place in nursing home as the place of occurrence of the external cause: Secondary | ICD-10-CM | POA: Insufficient documentation

## 2018-02-03 DIAGNOSIS — W19XXXA Unspecified fall, initial encounter: Secondary | ICD-10-CM | POA: Insufficient documentation

## 2018-02-03 DIAGNOSIS — Z79899 Other long term (current) drug therapy: Secondary | ICD-10-CM | POA: Diagnosis not present

## 2018-02-03 DIAGNOSIS — S0990XA Unspecified injury of head, initial encounter: Secondary | ICD-10-CM

## 2018-02-03 DIAGNOSIS — Y939 Activity, unspecified: Secondary | ICD-10-CM | POA: Diagnosis not present

## 2018-02-03 MED ORDER — ONDANSETRON 4 MG PO TBDP: 4.0000 mg | ORAL_TABLET | Freq: Three times a day (TID) | ORAL | 0 refills | Status: DC | PRN

## 2018-02-03 NOTE — ED Triage Notes (Signed)
Pt in with caregiver from a group home, reports a fall two days ago and hit his head, +LOC for several minutes, has not been seen since fall, reports weakness for awhile after but behavior is improving, almost at baseline, difficult to assess pain

## 2018-02-03 NOTE — ED Provider Notes (Signed)
[name] Provider Note   CSN: [id number] Arrival date & time: 02/03/18  0957     History   Chief Complaint Chief Complaint  Patient presents with  . Head Injury    HPI Paul Hendricks is a 27 y.o. male.  HPI   27 year old male with history of autism, epilepsy, presents from group home with concern for fall 2 days ago.  Per facility, patient had a fall 2 days ago and since then has been more fatigued than his baseline.  They report his appetite has been decreased.  He has not had any nausea or vomiting.  They do report he has had occasional diarrhea.  No fevers today.  No cough.  No focal numbness or weakness.  He has a bruise on the back of his head.  History is limited by patient being nonverbal.  Past Medical History:  Diagnosis Date  . Anxiety   . Autism disorder    WITH BEHAVIORAL ISSUES  . Seizures (HCC)    LAST SEIZURE 5-6 YRS AGO    Patient Active Problem List   Diagnosis Date Noted  . 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 Autism spectrum disorder 04/22/2015  . Impacted teeth with abnormal position 12/23/2014    Past Surgical History:  Procedure Laterality Date  . TOOTH EXTRACTION N/A 12/23/2014   Procedure: EXTRACTION  OF TEETH 4,09,81,191,16,17,32;  Surgeon: Lincoln Brighamhristopher , DDS;  [facility];  Service: Oral Surgery;  Laterality: N/A;        Home Medications    Prior to Admission medications   Medication Sig Start Date End Date Taking? Authorizing Provider  atorvastatin (LIPITOR) 20 MG tablet Take 20 mg by mouth daily.    [provider]  clonazePAM (KLONOPIN) 0.5 MG tablet Take 0.5-1 mg by mouth 2 (two) times daily. Take 1 tablet every morning and 2 tablets at bedtime    [provider]  diazepam (VALIUM) 10 MG tablet Take 10 mg by mouth See admin instructions. Take 10mg  by mouth every 4 hours up to twice daily as needed for anxiety     [provider]  divalproex (DEPAKOTE ER) 250 MG 24 hr tablet Take 1 tablet at night (take with  2 500mg  tablets for a total of 1250mg  at night) 04/19/17   Van ClinesAquino, Karen M, MD  divalproex (DEPAKOTE ER) 500 MG 24 hr tablet Take 3 Tablets at night.  DO NOT TAKE WITH 250MG  TABLET!! 07/18/17   Van ClinesAquino, Karen M, MD  Melatonin 5 MG TABS Take by mouth.    [provider]  ondansetron (ZOFRAN ODT) 4 MG disintegrating tablet Take 1 tablet (4 mg total) by mouth every 8 (eight) hours as needed for nausea or vomiting. 02/03/18   Alvira MondaySchlossman, Delvon Chipps, MD  risperiDONE (RISPERDAL) 1 MG tablet Take 1-2 mg by mouth 2 (two) times daily. Take 1mg  by mouth in the morning and take 2mg  by mouth at bedtime    [provider]  traZODone (DESYREL) 100 MG tablet Take 100 mg by mouth at bedtime.    [provider]  zolpidem (AMBIEN) 10 MG tablet Take 10 mg by mouth at bedtime as needed for sleep.    [provider]    Family History History reviewed. No pertinent family history.  Social History Social History   Tobacco Use  . Smoking status: Never Smoker  . Smokeless tobacco: Never Used  Substance Use Topics  . Alcohol use: No  Alcohol/week: 0.0 standard drinks  . Drug use: No     Allergies   Patient has no known allergies.   Review of Systems Review of Systems  Unable to perform ROS: Patient nonverbal  Constitutional: Positive for appetite change and fatigue. Negative for fever.     Physical Exam Updated Vital Signs BP 120/88 (BP Location: Right Arm)   Pulse 77   Temp 98 F (36.7 C) (Oral)   Resp 20   SpO2 100%   Physical Exam  Constitutional: He appears well-developed and well-nourished. No distress.  HENT:  Head: Normocephalic.  Contusion left parieto-occipital region  Eyes: Conjunctivae and EOM are normal.  Neck: Normal range of motion.  Cardiovascular: Normal rate, regular rhythm and intact distal pulses.  Pulmonary/Chest: Effort normal. No  respiratory distress.  Musculoskeletal: He exhibits no edema.  Neurological: He is alert.  Moving all 4 extremities with strength, normal coordination picking up and pulling blanket over head. Eyes with normal EOM  Skin: Skin is warm and dry. He is not diaphoretic.  Nursing note and vitals reviewed.    ED Treatments / Results  Labs (all labs ordered are listed, but only abnormal results are displayed) Labs Reviewed - No data to display  EKG None  Radiology Ct Head Wo Contrast  Result Date: 02/03/2018 CLINICAL DATA:  Pt is non-verbal Per RN notes: Pt in with caregiver from a group home, reports a fall two days ago and hit his head, +LOC for several minutes, has not been seen since fall, reports weakness for awhile after but behavior is improving HX autism EXAM: CT HEAD WITHOUT CONTRAST TECHNIQUE: Contiguous axial images were obtained from the base of the skull through the vertex without intravenous contrast. COMPARISON:  08/18/2005 FINDINGS: Brain: No evidence of acute infarction, hemorrhage, hydrocephalus, extra-axial collection or mass lesion/mass effect. Vascular: No hyperdense vessel or unexpected calcification. Skull: Normal. Negative for fracture or focal lesion. Sinuses/Orbits: No acute finding. Other: Skin thickening in the left frontal region. Acute subcutaneous hematoma left occipital region. IMPRESSION: Negative for bleed or other acute intracranial process. Electronically Signed   By: Corlis Leak M.D.   On: 02/03/2018 11:25    Procedures Procedures (including critical care time)  Medications Ordered in ED Medications - No data to display   Initial Impression / Assessment and Plan / ED Course  I have reviewed the triage vital signs and the nursing notes.  Pertinent labs & imaging results that were available during my care of the patient were reviewed by me and considered in my medical decision making (see chart for details).      28 year old male with history of autism,  epilepsy, presents from group home with concern for fall 2 days ago.  Pt with scalp contusion.  CT Head without acute abnormalities.  Possible concussion, although given patient is nonverbal history is limited to make this diagnosis.  Discussed that his decreased appetite may be secondary to nausea or headache, recommend Zofran and Tylenol.  Also possible that viral infection given some diarrhea is contributing to patient's fatigue.  Recommend continued hydration, Tylenol, Zofran, primary care physician follow-up.  Final Clinical Impressions(s) / ED Diagnoses   Final diagnoses:  Injury of head, initial encounter  Contusion of scalp, initial encounter    ED Discharge Orders         Ordered    ondansetron (ZOFRAN ODT) 4 MG disintegrating tablet  Every 8 hours PRN     02/03/18 1351  Alvira Monday, MD 02/03/18 1357

## 2018-02-13 ENCOUNTER — Telehealth: Payer: Self-pay | Admitting: Neurology

## 2018-02-13 NOTE — Telephone Encounter (Signed)
Patient had a seizure Saturday lasting about 5 mins at . He was moving anf jerking more than normal but not enough to call the ER. He is living in a group home. She just wanted someone to document this. If you need to call back it's [phone]. Thanks!

## 2018-02-13 NOTE — Telephone Encounter (Signed)
Noted  

## 2018-04-18 ENCOUNTER — Ambulatory Visit: Payer: Medicaid Other | Admitting: Neurology

## 2018-04-25 ENCOUNTER — Ambulatory Visit (INDEPENDENT_AMBULATORY_CARE_PROVIDER_SITE_OTHER): Payer: Medicaid Other | Admitting: Neurology

## 2018-04-25 ENCOUNTER — Encounter: Payer: Self-pay | Admitting: Neurology

## 2018-04-25 VITALS — Ht 74.0 in | Wt 231.0 lb

## 2018-04-25 DIAGNOSIS — G40309 Generalized idiopathic epilepsy and epileptic syndromes, not intractable, without status epilepticus: Secondary | ICD-10-CM | POA: Diagnosis not present

## 2018-04-25 MED ORDER — DIVALPROEX SODIUM ER 500 MG PO TB24: ORAL_TABLET | ORAL | 11 refills | Status: DC

## 2018-04-25 NOTE — Patient Instructions (Addendum)
1. Increase Divalproex Sodium ER 500mg : Take 1 tablet in AM, 3 tablets in PM 2. Continue all other medications and continue working with Psychiatry for behavioral issues 3. Follow-up in 6 months, call for any changes  Seizure Precautions: 1. If medication has been prescribed for you to prevent seizures, take it exactly as directed.  Do not stop taking the medicine without talking to your doctor first, even if you have not had a seizure in a long time.   2. Avoid activities in which a seizure would cause danger to yourself or to others.  Don't operate dangerous machinery, swim alone, or climb in high or dangerous places, such as on ladders, roofs, or girders.  Do not drive unless your doctor says you may.  3. If you have any warning that you may have a seizure, lay down in a safe place where you can't hurt yourself.    4.  No driving for 6 months from last seizure, as [place].   Please refer to the following link on the [employer]ile[employer]'s website for more information: http://www.epilepsyfoundation.org/answerplace/Social/driving/drivingu.cfm   5.  Maintain good sleep hygiene. Avoid alcohol.  6.  Contact your doctor if you have any problems that may be related to the medicine you are taking.  7.  Call 911 and bring the patient back to the ED if:        A.  The seizure lasts longer than 5 minutes.       B.  The patient doesn't awaken shortly after the seizure  C.  The patient has new problems such as difficulty seeing, speaking or moving  D.  The patient was injured during the seizure  E.  The patient has a temperature over 102 F (39C)  F.  The patient vomited and now is having trouble breathing

## 2018-04-25 NOTE — Progress Notes (Signed)
NEUROLOGY FOLLOW UP OFFICE NOTE  Paul Hendricks [id number]  DOB: 02-17-1991  HISTORY OF PRESENT ILLNESS: I had the pleasure of seeing Paul Hendricks in follow-up in the neurology clinic on 04/25/2018.  He is again accompanied by group home staff who supplement the history today as the patient is non-verbal. He was last seen a year ago for seizures. His longest seizure-free interval has been 5 years, he had a breakthrough seizure in  He had been seizure-free for 5 years, until a breakthrough seizure in October 2016, Depakote dose increased to 1000mg  qhs, then had another seizure in November 2017, Depakote level was 13. Dose increased to 1250mg  qhs. On his last visit, he had a convulsion in July 2018 and dose was increased to 1500mg  qhs. He also has "petit ones" where he would be staring off occasionally. Group home staff is concerned about the significant increase in seizures over the past 8 months. He had a nocturnal convulsion on 07/16/17 with whole body jerking and drooling lasting 5 seconds. On 12/26/17, he was jumping around (usual behavior) then slipped and fell, followed by whole body shaking for 3 minutes. On 01/31/18, he was pulling up underwear then fell and started shaking. He refused to go to the lab the next day and staff was finally able to get him to the ER on 9/13 where he had a head CT without contrast which I personally reviewed, no acute intracranial abnormalities. He had another seizure on 9/21, he was walking around, constantly banging on the wall, then fell with shaking. He fell asleep after. The last seizure occurred on 11/16, he woke up suddenly at 3:30am anxious and restless, then fell back on his bed with shaking for 2 minutes. He had another one at 8am lasting 3 minutes sustaining minor abrasions on his head from another fall.   Staff is concerned about the significant increase in seizures. His behavior has also been worsening the past few months, staff reports that the behavior  they are seeing now is similar to changes 8 years ago. His psychiatrist made changes to his medications every month since August, Risperdal was discontinued, Trazodone and clonazepam increased, Ziprasidone added. His behavior now is not as bad as Aug/Sept but they are still not back to his baseline. There have been no changes in his environment, it is unclear what the triggers are for behavioral changes and increase in seizures. He takes his medications. Last Depakote level in June 2019 was 75.   HPI 04/22/2015: Paul Hendricks is a 27 year old man with severe intellectual deficits, autism spectrum disorder, mood disorder, and seizures since childhood. Staff does not know much about his seizure history, but mother reported seizures in childhood prior to moving to his current group home 10 years ago. Review of psychological evaluation records indicate that he started having language delays at age 23, and was diagnosed with autism. He can be very aggressive with self-injurious behavior (banging head repeatedly on wall) and needs one-on-one supervision at all times. He can point and sign a little to indicate his needs, he can feed himself but needs someone to cut his food. Group home staff have witnessed 2 seizures in the past 10 years. He had a seizure 5 years ago in the playground, he suddenly stiffened up for around 5-10 minutes, was brought to the ER and discharged home. He has been taking Depakote ER 750mg  (250mg  tablet + 500mg  tablet) qhs with no changes, no side effects. He had another seizure last 03/04/15, staff reports  he was very hyperactive that day, jumping up and down, then fell and started convulsing for a few minutes. No tongue bite or significant injuries. He saw his PCP the next day, bloodwork reviewed, CBC and CMP normal, Depakote level 48. Staff denies any missed medications, recent infections, or sleep pattern changes.   Diagnostic Data: I personally reviewed head CT available from 07/2005 with  indication of new onset seizure, unremarkable, no acute changes seen.  PAST MEDICAL HISTORY: Past Medical History:  Diagnosis Date  . Anxiety   . Autism disorder    WITH BEHAVIORAL ISSUES  . Seizures (HCC)    LAST SEIZURE 5-6 YRS AGO    MEDICATIONS:  Outpatient Encounter Medications as of 04/25/2018  Medication Sig  . atorvastatin (LIPITOR) 20 MG tablet Take 20 mg by mouth daily.  . clonazePAM (KLONOPIN) 0.5 MG tablet Take 0.5-1 mg by mouth 2 (two) times daily. Take 1 tablet every morning, afternoon, and 2 tablets at bedtime  . divalproex (DEPAKOTE ER) 500 MG 24 hr tablet Take 1 tablet in AM, 3 tablets in PM  . hydrOXYzine (ATARAX/VISTARIL) 50 MG tablet Take 50 mg by mouth 3 (three) times daily as needed.  . ondansetron (ZOFRAN ODT) 4 MG disintegrating tablet Take 1 tablet (4 mg total) by mouth every 8 (eight) hours as needed for nausea or vomiting.  . traZODone (DESYREL) 100 MG tablet Take 100 mg by mouth 2 (two) times daily.   . ziprasidone (GEODON) 60 MG capsule Take 60 mg by mouth 2 (two) times daily with a meal.                           No facility-administered encounter medications on file as of 04/25/2018.    ALLERGIES: No Known Allergies  FAMILY HISTORY: History reviewed. No pertinent family history.  SOCIAL HISTORY: Social History   Socioeconomic History  . Marital status: Single    Spouse name: Not on file  . Number of children: Not on file  . Years of education: Not on file  . Highest education level: Not on file  Occupational History  . Not on file  Social Needs  . Financial resource strain: Not on file  . Food insecurity:    Worry: Not on file    Inability: Not on file  . Transportation needs:    Medical: Not on file    Non-medical: Not on file  Tobacco Use  . Smoking status: Never Smoker  . Smokeless tobacco: Never Used  Substance and Sexual Activity  . Alcohol use: No    Alcohol/week: 0.0 standard drinks  . Drug use: No  . Sexual activity:  Not on file  Lifestyle  . Physical activity:    Days per week: Not on file    Minutes per session: Not on file  . Stress: Not on file  Relationships  . Social connections:    Talks on phone: Not on file    Gets together: Not on file    Attends religious service: Not on file    Active member of club or organization: Not on file    Attends meetings of clubs or organizations: Not on file    Relationship status: Not on file  . Intimate partner violence:    Fear of current or ex partner: Not on file    Emotionally abused: Not on file    Physically abused: Not on file    Forced sexual activity: Not on file  Other Topics Concern  . Not on file  Social History Narrative  . Not on file    REVIEW OF SYSTEMS unable to obtain due to patient's mental status (non-verbal)  PHYSICAL EXAM: There were no vitals filed for this visit. Unable to obtain, patient gets agitated when touched General: No acute distress, needs constant supervision due to aggression/biting, non-verbal, follows simple visual commands to lift both arms and touch his nose, walk around the room Head:  Normocephalic/atraumatic Eyes: unable to do fundoscopy, no visible abnormalities Skin/Extremities: No rash, no edema Neurological Exam: Mental status: awake, alert, non-verbal except for some vocalizations, does not follow commands Cranial nerves: CN I: not tested CN II: pupils equal, round CN III, IV, VI:  full range of motion, no nystagmus, no ptosis CN V: unable to test CN VII: upper and lower face symmetric CN VIII: hearing appears intact to voice CN IX, X: unable to test CN XII: tongue midline Bulk & Tone: slightly increased tone throughout, no fasciculations. Motor: unable to do formal muscle testing, moves all extremities symmetrically Sensation: withdraws to touch Cerebellar: no ataxia noted on finger to nose testing Gait: wide-based Tremor: none  IMPRESSION: This is a 27 year old man with severe  intellectual disability, autism spectrum disorder, mood/behavioral disorder, and seizures since childhood, with an increase in seizure frequency. His longest seizure-free interval was 5 years, he was having around 1 seizure a year after, but over the past year he has had 6. Unclear etiology, head CT done 01/2018 no acute changes. Increase Depakote ER to 500mg  in AM, 1500mg  in PM. We may add on Lamotrigine if needed for both seizures and behavior. Psychiatric medications have been adjusted the past few months for an increase in behavioral issues. Check Depakote level when able. He will follow-up in 6 months or earlier if needed  Thank you for allowing me to participate in his care.  Please do not hesitate to call for any questions or concerns.  The duration of this appointment visit was 30 minutes of face-to-face time with the patient.  Greater than 50% of this time was spent in counseling, explanation of diagnosis, planning of further management, and coordination of care.   Patrcia DollyKaren Conall Vangorder, M.D.   CC: Marva PandaKimberly Millsaps, NP

## 2018-10-09 ENCOUNTER — Encounter: Payer: Self-pay | Admitting: *Deleted

## 2018-10-09 ENCOUNTER — Ambulatory Visit: Payer: Medicaid Other | Admitting: Neurology

## 2018-10-10 ENCOUNTER — Other Ambulatory Visit: Payer: Self-pay

## 2018-10-10 ENCOUNTER — Telehealth (INDEPENDENT_AMBULATORY_CARE_PROVIDER_SITE_OTHER): Payer: Medicaid Other | Admitting: Neurology

## 2018-10-10 ENCOUNTER — Ambulatory Visit: Payer: Medicaid Other | Admitting: Neurology

## 2018-10-10 DIAGNOSIS — L209 Atopic dermatitis, unspecified: Secondary | ICD-10-CM | POA: Insufficient documentation

## 2018-10-10 DIAGNOSIS — K5900 Constipation, unspecified: Secondary | ICD-10-CM | POA: Insufficient documentation

## 2018-10-10 DIAGNOSIS — F79 Unspecified intellectual disabilities: Secondary | ICD-10-CM

## 2018-10-10 DIAGNOSIS — G40309 Generalized idiopathic epilepsy and epileptic syndromes, not intractable, without status epilepticus: Secondary | ICD-10-CM

## 2018-10-10 MED ORDER — DIVALPROEX SODIUM ER 500 MG PO TB24: ORAL_TABLET | ORAL | 11 refills | Status: DC

## 2018-10-10 NOTE — Progress Notes (Signed)
Virtual Visit via Video Note The purpose of this virtual visit is to provide medical care while limiting exposure to the novel coronavirus.    Consent was obtained for video visit:  Yes.   Answered questions that patient had about telehealth interaction:  Yes.   I discussed the limitations, risks, security and privacy concerns of performing an evaluation and management service by telemedicine. I also discussed with the patient that there may be a patient responsible charge related to this service. The patient expressed understanding and agreed to proceed.  Pt location: Home Physician Location: office Name of referring provider:  Marva PandaMillsaps, Kimberly, NP I connected with Paul PasturesGabriel Pardue at patients legal guardian's initiation/request on 10/10/2018 at 10:30 AM EDT by video enabled telemedicine application and verified that I am speaking with the correct person using two identifiers. Pt MRN:  [id number] Pt DOB:  1991/04/05 Video Participants:  Paul Hendricks;  Paul Hendricks (legal guardian)   History of Present Illness:  The patient was last seen in December 2019 for recurrent seizures. His legal guardian Paul Hendricks is present to provide information, he is non-verbal. On his last visit, she reported an increase in seizure frequency with 6 convulsions in a year (baseline is 1 a year). Depakote dose was increased to 500mg  in AM, 1500mg  in PM. She reports he did well with dose adjustment, no further GTCs since 03/2018. He has had 2 petit mals where he rolled his eyes for 2-3 seconds. He continues to see his psychiatrist Dr. Jannifer FranklinAkintayo and has a telehealth visit tomorrow. His behavior is up and down, he needs redirection, but appears more manageable for staff. Sleep is off and on, some nights he sleeps good, last night he woke up early and is sleepy during today's visit. [name] denies any falls. Appetite is good.   HPI 04/22/2015: Paul Hendricks is a 28 year old man with severe intellectual deficits, autism  spectrum disorder, mood disorder, and seizures since childhood. Staff does not know much about his seizure history, but mother reported seizures in childhood prior to moving to his current group home 10 years ago. Review of psychological evaluation records indicate that he started having language delays at age 39, and was diagnosed with autism. He can be very aggressive with self-injurious behavior (banging head repeatedly on wall) and needs one-on-one supervision at all times. He can point and sign a little to indicate his needs, he can feed himself but needs someone to cut his food. Group home staff have witnessed 2 seizures in the past 10 years. He had a seizure 5 years ago in the playground, he suddenly stiffened up for around 5-10 minutes, was brought to the ER and discharged home. He has been taking Depakote ER 750mg  (250mg  tablet + 500mg  tablet) qhs with no changes, no side effects. He had another seizure last 03/04/15, staff reports he was very hyperactive that day, jumping up and down, then fell and started convulsing for a few minutes. No tongue bite or significant injuries. He saw his PCP the next day, bloodwork reviewed, CBC and CMP normal, Depakote level 48. Staff denies any missed medications, recent infections, or sleep pattern changes.   Update 04/2018: His longest seizure-free interval has been 5 years, he had a breakthrough seizure in October 2016, Depakote dose increased to 1000mg  qhs, then had another seizure in November 2017, Depakote level was 13. Dose increased to 1250mg  qhs. On his last visit, he had a convulsion in July 2018 and dose was increased to 1500mg  qhs. He also  has "petit ones" where he would be staring off occasionally. Group home staff is concerned about the significant increase in seizures over the past 8 months. He had a nocturnal convulsion on 07/16/17 with whole body jerking and drooling lasting 5 seconds. On 12/26/17, he was jumping around (usual behavior) then slipped and  fell, followed by whole body shaking for 3 minutes. On 01/31/18, he was pulling up underwear then fell and started shaking. He refused to go to the lab the next day and staff was finally able to get him to the ER on 9/13 where he had a head CT without contrast which I personally reviewed, no acute intracranial abnormalities. He had another seizure on 9/21, he was walking around, constantly banging on the wall, then fell with shaking. He fell asleep after. The last seizure occurred on 11/16, he woke up suddenly at 3:30am anxious and restless, then fell back on his bed with shaking for 2 minutes. He had another one at 8am lasting 3 minutes sustaining minor abrasions on his head from another fall.   Staff is concerned about the significant increase in seizures. His behavior has also been worsening the past few months, staff reports that the behavior they are seeing now is similar to changes 8 years ago. His psychiatrist made changes to his medications every month since August, Risperdal was discontinued, Trazodone and clonazepam increased, Ziprasidone added. His behavior now is not as bad as Aug/Sept but they are still not back to his baseline. There have been no changes in his environment, it is unclear what the triggers are for behavioral changes and increase in seizures. He takes his medications. Last Depakote level in June 2019 was 75.   Diagnostic Data: I personally reviewed head CT available from 07/2005 with indication of new onset seizure, unremarkable, no acute changes seen.    Current Outpatient Medications on File Prior to Visit  Medication Sig Dispense Refill  . atorvastatin (LIPITOR) 20 MG tablet Take 20 mg by mouth daily.    . clonazePAM (KLONOPIN) 0.5 MG tablet Take 0.5-1 mg by mouth 2 (two) times daily. Take 1 tablet every morning, afternoon, and 2 tablets at bedtime    . divalproex (DEPAKOTE ER) 500 MG 24 hr tablet Take 1 tablet in AM, 3 tablets in PM 120 tablet 11  . hydrOXYzine  (ATARAX/VISTARIL) 50 MG tablet Take 50 mg by mouth 3 (three) times daily as needed.    . Melatonin 5 MG TABS Take by mouth 2 (two) times daily.    . naltrexone (DEPADE) 50 MG tablet Take by mouth daily. Half tab 4 pm 8 pm    . ondansetron (ZOFRAN ODT) 4 MG disintegrating tablet Take 1 tablet (4 mg total) by mouth every 8 (eight) hours as needed for nausea or vomiting. 20 tablet 0  . traZODone (DESYREL) 100 MG tablet Take 100 mg by mouth 2 (two) times daily.     . ziprasidone (GEODON) 60 MG capsule Take 60 mg by mouth 2 (two) times daily with a meal.     No current facility-administered medications on file prior to visit.      Observations/Objective:   GEN:  The patient appears stated age with dysmorphic facial features, he is drowsy and lying on bed during today's visit. Jigar is non-verbal and does not follow commands. Staff tries to engage him, he appears to move all extremities symmetrically.  Assessment and Plan:   This is a 28 year old man with severe intellectual disability, autism spectrum disorder,  mood/behavioral disorder, and seizures since childhood. Longest seizure-free interval was 5 years, he was doing well for a time until last year when he had an increase in seizures. Depakote ER increased to  in AM,  in PM with no further convulsions since 03/2018. He has occasional petit mal seizures (2 since last visit). Continue current dose of Depakote. Continue follow-up with Psychiatry. Check Depakote level when able. He will follow-up in 1 year or earlier if needed.   Follow Up Instructions:   -I discussed the assessment and treatment plan with the patient's legal guardian. The patient's legal guardian was provided an opportunity to ask questions and all were answered. The patient's legal guardian agreed with the plan and demonstrated an understanding of the instructions.   The patient's legal guardian was advised to call back or seek an in-person evaluation if the symptoms  worsen or if the condition fails to improve as anticipated.    Van Clines, MD

## 2019-05-14 ENCOUNTER — Emergency Department (HOSPITAL_COMMUNITY): Payer: Medicaid Other

## 2019-05-14 ENCOUNTER — Encounter (HOSPITAL_COMMUNITY): Payer: Self-pay | Admitting: Emergency Medicine

## 2019-05-14 ENCOUNTER — Emergency Department (HOSPITAL_COMMUNITY)
Admission: EM | Admit: 2019-05-14 | Discharge: 2019-05-15 | Disposition: A | Discharge status: Home / Self Care | Payer: Medicaid Other | Attending: Emergency Medicine | Admitting: Emergency Medicine

## 2019-05-14 DIAGNOSIS — F79 Unspecified intellectual disabilities: Secondary | ICD-10-CM | POA: Insufficient documentation

## 2019-05-14 DIAGNOSIS — E860 Dehydration: Secondary | ICD-10-CM | POA: Diagnosis not present

## 2019-05-14 DIAGNOSIS — F840 Autistic disorder: Secondary | ICD-10-CM | POA: Insufficient documentation

## 2019-05-14 DIAGNOSIS — Z20828 Contact with and (suspected) exposure to other viral communicable diseases: Secondary | ICD-10-CM | POA: Diagnosis not present

## 2019-05-14 DIAGNOSIS — Z79899 Other long term (current) drug therapy: Secondary | ICD-10-CM | POA: Diagnosis not present

## 2019-05-14 DIAGNOSIS — Z8669 Personal history of other diseases of the nervous system and sense organs: Secondary | ICD-10-CM | POA: Insufficient documentation

## 2019-05-14 DIAGNOSIS — R531 Weakness: Secondary | ICD-10-CM

## 2019-05-14 DIAGNOSIS — Z87898 Personal history of other specified conditions: Secondary | ICD-10-CM

## 2019-05-14 LAB — URINALYSIS, ROUTINE W REFLEX MICROSCOPIC
Bilirubin Urine: NEGATIVE
Glucose, UA: NEGATIVE mg/dL
Hgb urine dipstick: NEGATIVE
Ketones, ur: NEGATIVE mg/dL
Leukocytes,Ua: NEGATIVE
Nitrite: NEGATIVE
Protein, ur: NEGATIVE mg/dL
Specific Gravity, Urine: 1.021 (ref 1.005–1.030)
pH: 5 (ref 5.0–8.0)

## 2019-05-14 LAB — COMPREHENSIVE METABOLIC PANEL
ALT: 22 U/L (ref 0–44)
AST: 36 U/L (ref 15–41)
Albumin: 2.8 g/dL — ABNORMAL LOW (ref 3.5–5.0)
Alkaline Phosphatase: 60 U/L (ref 38–126)
Anion gap: 9 (ref 5–15)
BUN: 24 mg/dL — ABNORMAL HIGH (ref 6–20)
CO2: 29 mmol/L (ref 22–32)
Calcium: 9.3 mg/dL (ref 8.9–10.3)
Chloride: 101 mmol/L (ref 98–111)
Creatinine, Ser: 1.41 mg/dL — ABNORMAL HIGH (ref 0.61–1.24)
GFR calc Af Amer: >60 mL/min (ref >60)
GFR calc non Af Amer: >60 mL/min (ref >60)
Glucose, Bld: 99 mg/dL (ref 70–99)
Potassium: 5 mmol/L (ref 3.5–5.1)
Sodium: 139 mmol/L (ref 135–145)
Total Bilirubin: 0.3 mg/dL (ref 0.3–1.2)
Total Protein: 6.9 g/dL (ref 6.5–8.1)

## 2019-05-14 LAB — CBC WITH DIFFERENTIAL/PLATELET
Abs Immature Granulocytes: 0.44 10*3/uL — ABNORMAL HIGH (ref 0.00–0.07)
Basophils Absolute: 0 10*3/uL (ref 0.0–0.1)
Basophils Relative: 0 %
Eosinophils Absolute: 0.1 10*3/uL (ref 0.0–0.5)
Eosinophils Relative: 1 %
HCT: 43.6 % (ref 39.0–52.0)
Hemoglobin: 14.3 g/dL (ref 13.0–17.0)
Immature Granulocytes: 4 %
Lymphocytes Relative: 26 %
Lymphs Abs: 2.8 10*3/uL (ref 0.7–4.0)
MCH: 30.4 pg (ref 26.0–34.0)
MCHC: 32.8 g/dL (ref 30.0–36.0)
MCV: 92.8 fL (ref 80.0–100.0)
Monocytes Absolute: 1.7 10*3/uL — ABNORMAL HIGH (ref 0.1–1.0)
Monocytes Relative: 16 %
Neutro Abs: 5.6 10*3/uL (ref 1.7–7.7)
Neutrophils Relative %: 53 %
Platelets: 164 10*3/uL (ref 150–400)
RBC: 4.7 MIL/uL (ref 4.22–5.81)
RDW: 11.2 % — ABNORMAL LOW (ref 11.5–15.5)
WBC: 10.6 10*3/uL — ABNORMAL HIGH (ref 4.0–10.5)
nRBC: 0 % (ref 0.0–0.2)

## 2019-05-14 LAB — VALPROIC ACID LEVEL: Valproic Acid Lvl: 93 ug/mL (ref 50.0–100.0)

## 2019-05-14 LAB — SARS CORONAVIRUS 2 (TAT 6-24 HRS): SARS Coronavirus 2: NEGATIVE

## 2019-05-14 MED ORDER — DIPHENHYDRAMINE HCL 50 MG/ML IJ SOLN
25.0000 mg | Freq: Once | INTRAMUSCULAR | Status: AC
  Administered 2019-05-14: 25 mg via INTRAMUSCULAR
  Filled 2019-05-14: qty 1

## 2019-05-14 MED ORDER — HALOPERIDOL LACTATE 5 MG/ML IJ SOLN
5.0000 mg | Freq: Once | INTRAMUSCULAR | Status: AC
  Administered 2019-05-14: 5 mg via INTRAMUSCULAR
  Filled 2019-05-14: qty 1

## 2019-05-14 MED ORDER — LORAZEPAM 2 MG/ML IJ SOLN
2.0000 mg | Freq: Once | INTRAMUSCULAR | Status: AC
  Administered 2019-05-14: 2 mg via INTRAVENOUS
  Filled 2019-05-14: qty 1

## 2019-05-14 MED ORDER — SODIUM CHLORIDE 0.9 % IV BOLUS
1000.0000 mL | Freq: Once | INTRAVENOUS | Status: AC
  Administered 2019-05-14: 1000 mL via INTRAVENOUS

## 2019-05-14 NOTE — ED Notes (Signed)
Got patient on the monitor patient is resting with care taker at bedside

## 2019-05-14 NOTE — ED Notes (Signed)
[facility] called for transport.  

## 2019-05-14 NOTE — Discharge Instructions (Addendum)
Encourage food and fluids. Follow up with primary care.  Your head CT scan today is normal.  Follow up with your neurologist for further management of your seizure history. Your COVID-19 test is negative today.

## 2019-05-14 NOTE — ED Triage Notes (Signed)
Pt arrives from [facility] via EMS. Staff reports he has had decreased appetite since last week and more lethargic today. Pt vomited once this morning. Pt is autistic, combative with EMS when they attempted to perform care. Pt is nonverbal, but is able to understand. BP 108/72, HR 70, CBG 146, 98.4

## 2019-05-14 NOTE — ED Notes (Signed)
Attempted to reach contact number listed for pt legal guardian, unable to reach

## 2019-05-14 NOTE — ED Notes (Signed)
Pt still combative when RN attempts to obtain blood work. Phlebotomy was able to obtain labs with 4 people holding pt down. No IV access at this time.

## 2019-05-14 NOTE — ED Triage Notes (Signed)
EMS describes a possible "absent seizure" en route. Pt started to stare off and was not responsive. He was difficult to arouse for approx 15 then pt was responsive.

## 2019-05-14 NOTE — ED Notes (Signed)
Spoke to [employer], gave report on pt, aware that he will return to group home via ambulance because he is unable to pick pt up. Shared with Mr. Reche Dixon that I was unable to reach legal guardian at listed number, states that she can no longer be reached at this time of day.

## 2019-05-14 NOTE — ED Provider Notes (Signed)
Received signout from previous provider please refer to her note for complete H&P.  This is a 28 year old male who lives in a group home with autism and intellectual disability and history of seizures.  Patient is currently on Depakote for seizure.  Recently finished amoxicillin for a dental infection.  Today Depakote level is within normal limit.  Patient initially was thrashing around the room and did receive Haldol and Benadryl and Ativan with improvement of symptoms.  Head CT scan resulted and normal, labs are reassuring.  Patient able to eat and drink.  BP 114/69 (BP Location: Right Arm)   Pulse 65   Temp 98.4 F (36.9 C) (Oral)   Resp 17   SpO2 100%   Results for orders placed or performed during the hospital encounter of 05/14/19  SARS CORONAVIRUS 2 (TAT 6-24 HRS) Nasopharyngeal Nasopharyngeal Swab   Specimen: Nasopharyngeal Swab  Result Value Ref Range   SARS Coronavirus 2 NEGATIVE NEGATIVE  CBC with Differential  Result Value Ref Range   WBC 10.6 (H) 4.0 - 10.5 K/uL   RBC 4.70 4.22 - 5.81 MIL/uL   Hemoglobin 14.3 13.0 - 17.0 g/dL   HCT 43.6 39.0 - 52.0 %   MCV 92.8 80.0 - 100.0 fL   MCH 30.4 26.0 - 34.0 pg   MCHC 32.8 30.0 - 36.0 g/dL   RDW 11.2 (L) 11.5 - 15.5 %   Platelets 164 150 - 400 K/uL   nRBC 0.0 0.0 - 0.2 %   Neutrophils Relative % 53 %   Neutro Abs 5.6 1.7 - 7.7 K/uL   Lymphocytes Relative 26 %   Lymphs Abs 2.8 0.7 - 4.0 K/uL   Monocytes Relative 16 %   Monocytes Absolute 1.7 (H) 0.1 - 1.0 K/uL   Eosinophils Relative 1 %   Eosinophils Absolute 0.1 0.0 - 0.5 K/uL   Basophils Relative 0 %   Basophils Absolute 0.0 0.0 - 0.1 K/uL   WBC Morphology See Note    Immature Granulocytes 4 %   Abs Immature Granulocytes 0.44 (H) 0.00 - 0.07 K/uL  Comprehensive metabolic panel  Result Value Ref Range   Sodium 139 135 - 145 mmol/L   Potassium 5.0 3.5 - 5.1 mmol/L   Chloride 101 98 - 111 mmol/L   CO2 29 22 - 32 mmol/L   Glucose, Bld 99 70 - 99 mg/dL   BUN 24 (H) 6 -  20 mg/dL   Creatinine, Ser 1.41 (H) 0.61 - 1.24 mg/dL   Calcium 9.3 8.9 - 10.3 mg/dL   Total Protein 6.9 6.5 - 8.1 g/dL   Albumin 2.8 (L) 3.5 - 5.0 g/dL   AST 36 15 - 41 U/L   ALT 22 0 - 44 U/L   Alkaline Phosphatase 60 38 - 126 U/L   Total Bilirubin 0.3 0.3 - 1.2 mg/dL   GFR calc non Af Amer >60 >60 mL/min   GFR calc Af Amer >60 >60 mL/min   Anion gap 9 5 - 15  Valproic acid level  Result Value Ref Range   Valproic Acid Lvl 93 50.0 - 100.0 ug/mL  Urinalysis, Routine w reflex microscopic  Result Value Ref Range   Color, Urine YELLOW YELLOW   APPearance HAZY (A) CLEAR   Specific Gravity, Urine 1.021 1.005 - 1.030   pH 5.0 5.0 - 8.0   Glucose, UA NEGATIVE NEGATIVE mg/dL   Hgb urine dipstick NEGATIVE NEGATIVE   Bilirubin Urine NEGATIVE NEGATIVE   Ketones, ur NEGATIVE NEGATIVE mg/dL   Protein,  ur NEGATIVE NEGATIVE mg/dL   Nitrite NEGATIVE NEGATIVE   Leukocytes,Ua NEGATIVE NEGATIVE   CT Head Wo Contrast  Result Date: 05/14/2019 CLINICAL DATA:  28 year old male with mental status change. Decreased appetite. Increasingly with lethargic. Vomited once this morning. EXAM: CT HEAD WITHOUT CONTRAST TECHNIQUE: Contiguous axial images were obtained from the base of the skull through the vertex without intravenous contrast. COMPARISON:  Head CT 02/03/2018. FINDINGS: Brain: No evidence of acute infarction, hemorrhage, hydrocephalus, extra-axial collection or mass lesion/mass effect. Vascular: No hyperdense vessel or unexpected calcification. Skull: Normal. Negative for fracture or focal lesion. Sinuses/Orbits: No acute finding. Other: None. IMPRESSION: 1. No acute intracranial abnormalities. Electronically Signed   By: Trudie Reed M.D.   On: 05/14/2019 18:23      Fayrene Helper, PA-C 05/14/19 2314    Terrilee Files, MD 05/15/19 1014

## 2019-05-14 NOTE — ED Provider Notes (Signed)
[name] EMERGENCY DEPARTMENT Provider Note   CSN: [id number] Arrival date & time: 05/14/19  1009     History No chief complaint on file.   Paul Hendricks is a 28 y.o. male.  Pt lives in a group home.  History is from group home staff.  Pt has autism and intellectual disability.  Pt is reported to be less active for the past 2 days.  EMS reports possible seizure on the ride to the ED. They report pt was staring away and not responding. Pt is on depakote for seizures.  Pt recently finished rx for amoxicillian for a dental infection.   The history is provided by a caregiver. The history is limited by a developmental delay and a language barrier. No language interpreter was used.       Past Medical History:  Diagnosis Date  . Anxiety   . Autism disorder    WITH BEHAVIORAL ISSUES  . Seizures (HCC)    LAST SEIZURE 5-6 YRS AGO    Patient Active Problem List   Diagnosis Date Noted  . Acute constipation 10/10/2018  . Atopic dermatitis 10/10/2018  . Hypertriglyceridemia 01/12/2017  . 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 Autistic disorder 04/22/2015  . Bipolar disorder (HCC) 03/05/2015  . Impacted teeth with abnormal position 12/23/2014    Past Surgical History:  Procedure Laterality Date  . TOOTH EXTRACTION N/A 12/23/2014   Procedure: EXTRACTION  OF TEETH 7,68,11,57;  Surgeon: Lincoln Brigham, DDS;  Location: [facility];  Service: Oral Surgery;  Laterality: N/A;       No family history on file.  Social History   Tobacco Use  . Smoking status: Never Smoker  . Smokeless tobacco: Never Used  Substance Use Topics  . Alcohol use: No    Alcohol/week: 0.0 standard drinks  . Drug use: No    Home Medications Prior to Admission medications   Medication Sig Start Date End Date Taking? Authorizing Provider  atorvastatin (LIPITOR) 20 MG tablet Take 20 mg by mouth daily.    [provider]  clonazePAM (KLONOPIN) 0.5 MG tablet Take 0.5-1 mg by mouth 2 (two) times daily. Take 1 tablet every morning, afternoon, and 2 tablets at bedtime    [provider]  divalproex (DEPAKOTE ER) 500 MG 24 hr tablet Take 1 tablet in AM, 3 tablets in PM 10/10/18   Van Clines, MD  hydrOXYzine (ATARAX/VISTARIL) 50 MG tablet Take 50 mg by mouth 3 (three) times daily as needed.    [provider]  Melatonin 5 MG TABS Take by mouth 2 (two) times daily.    [provider]  naltrexone (DEPADE) 50 MG tablet Take by mouth daily. Half tab 4 pm 8 pm    [provider]  ondansetron (ZOFRAN ODT) 4 MG disintegrating tablet Take 1 tablet (4 mg total) by mouth every 8 (eight) hours as needed for nausea or vomiting. 02/03/18   Alvira Monday, MD  traZODone (DESYREL) 100 MG tablet Take 100 mg by mouth 2 (two) times daily.     [provider]  ziprasidone (GEODON) 60 MG capsule Take 60 mg by mouth 2 (two) times daily with a meal.    [provider]    Allergies    Atorvastatin  Review of Systems   Review of Systems  Unable to perform ROS: Patient nonverbal    Physical Exam Updated Vital Signs BP 104/67   Pulse 64   Temp  98.4 F (36.9 C) (Oral)   Resp 20   SpO2 100%   Physical Exam Vitals and nursing note reviewed.  Constitutional:      Appearance: He is well-developed.  HENT:     Head: Normocephalic.  Eyes:     Conjunctiva/sclera: Conjunctivae normal.  Cardiovascular:     Rate and Rhythm: Normal rate and regular rhythm.     Heart sounds: No murmur.  Pulmonary:     Effort: Pulmonary effort is normal. No respiratory distress.     Breath sounds: Normal breath sounds.  Abdominal:     Palpations: Abdomen is soft.     Tenderness: There is no abdominal tenderness.  Musculoskeletal:     Cervical back: Neck supple.  Skin:    General: Skin is warm and dry.  Neurological:     General: No focal deficit present.     Mental Status:  He is alert.     ED Results / Procedures / Treatments   Labs (all labs ordered are listed, but only abnormal results are displayed) Labs Reviewed  CBC WITH DIFFERENTIAL/PLATELET - Abnormal; Notable for the following components:      Result Value   WBC 10.6 (*)    RDW 11.2 (*)    Monocytes Absolute 1.7 (*)    Abs Immature Granulocytes 0.44 (*)    All other components within normal limits  SARS CORONAVIRUS 2 (TAT 6-24 HRS)  COMPREHENSIVE METABOLIC PANEL  VALPROIC ACID LEVEL  URINALYSIS, ROUTINE W REFLEX MICROSCOPIC    EKG None  Radiology No results found.  Procedures Procedures (including critical care time)  Medications Ordered in ED Medications  LORazepam (ATIVAN) injection 2 mg (2 mg Intravenous Given 05/14/19 1027)  haloperidol lactate (HALDOL) injection 5 mg (5 mg Intramuscular Given 05/14/19 1109)  diphenhydrAMINE (BENADRYL) injection 25 mg (25 mg Intramuscular Given 05/14/19 1109)    ED Course  I have reviewed the triage vital signs and the nursing notes.  Pertinent labs & imaging results that were available during my care of the patient were reviewed by me and considered in my medical decision making (see chart for details).    MDM Rules/Calculators/A&P                      MDM  Pt thrashing in room.  Pt given ativan 2mg  Im without impovement.  Pt given haldol and benadryl.  depakote is normal range.   Final Clinical Impression(s) / ED Diagnoses Final diagnoses:  Weakness  History of seizure    Rx / DC Orders ED Discharge Orders    None    Pt's care turned over to [place] head pending.    Sidney Ace 05/15/19 0703    Quintella Reichert, MD 05/16/19 [phone]

## 2019-05-14 NOTE — ED Notes (Signed)
Gave pt apple juice and crackers. Tolerated well. Pt did not want to eat any more

## 2019-05-15 MED ORDER — STERILE WATER FOR INJECTION IJ SOLN
INTRAMUSCULAR | Status: AC
  Filled 2019-05-15: qty 10

## 2019-05-15 MED ORDER — ZIPRASIDONE MESYLATE 20 MG IM SOLR
20.0000 mg | Freq: Once | INTRAMUSCULAR | Status: AC
  Administered 2019-05-15: 20 mg via INTRAMUSCULAR
  Filled 2019-05-15: qty 20

## 2019-05-15 MED ORDER — LORAZEPAM 2 MG/ML IJ SOLN: 2.0000 mg | Freq: Once | INTRAMUSCULAR | Status: DC

## 2019-05-15 NOTE — ED Notes (Signed)
Patient arrives because Group home will answer door.

## 2019-05-15 NOTE — ED Notes (Signed)
Patient verbalizes understanding of discharge instructions. Opportunity for questioning and answers were provided. Armband removed by staff, pt discharged from ED. Left via [facility]  

## 2019-05-15 NOTE — ED Provider Notes (Signed)
28 year old male who was seen earlier in the ED and was discharged back to his group home.  Unfortunately, when EMS took him back to the group home no one answered the door so he was brought back to the ER.  I found the patient wandering in the hallway unattended.  I was able to get the patient back to his hallway bed with the assistance of staff.  Per chart review, it appears that the patient received 2 mg of Ativan earlier in the day as he was noted to be thrashing about.  He was also given Haldol and Benadryl as Ativan did not seem to improve in symptoms.  After reviewing his medications, it appears that the patient received 60 mg of Geodon orally twice daily with meals.  Since the patient has been here since yesterday afternoon, it does not appear that he received his nighttime dose of Geodon.  Oral Geodon is not available at this facility and after speaking with Marya Amsler, pharmacist, will order 20 mg of IM Geodon, which he suspects should not significantly sedate the patient given that he is on 120 mg daily.  This has been ordered.  Also found that the patient's sheets have been soiled.  Staff is in the process of changing his sheets and finding replacements clothes.  I was able to get the patient back in the bed.  He was given 2 warm blankets and is now resting comfortably awaiting second attempt at transportation back to group home.   Joanne Gavel, PA-C 40/10/27 2536    Delora Fuel, MD 64/40/34 [phone]

## 2019-05-23 ENCOUNTER — Emergency Department (HOSPITAL_COMMUNITY): Payer: Medicaid Other

## 2019-05-23 ENCOUNTER — Encounter (HOSPITAL_COMMUNITY): Admission: EM | Disposition: A | Discharge status: Home / Self Care | Payer: Self-pay | Source: Home / Self Care

## 2019-05-23 ENCOUNTER — Inpatient Hospital Stay (HOSPITAL_COMMUNITY)
Admission: EM | Admit: 2019-05-23 | Discharge: 2019-05-26 | DRG: 339 | Disposition: A | Discharge status: Home / Self Care | Payer: Medicaid Other | Attending: General Surgery | Admitting: General Surgery

## 2019-05-23 ENCOUNTER — Emergency Department (HOSPITAL_COMMUNITY): Payer: Medicaid Other | Admitting: Certified Registered"

## 2019-05-23 ENCOUNTER — Observation Stay (HOSPITAL_COMMUNITY): Payer: Medicaid Other

## 2019-05-23 ENCOUNTER — Encounter (HOSPITAL_COMMUNITY): Payer: Self-pay | Admitting: Emergency Medicine

## 2019-05-23 DIAGNOSIS — Z20822 Contact with and (suspected) exposure to covid-19: Secondary | ICD-10-CM | POA: Diagnosis present

## 2019-05-23 DIAGNOSIS — Z046 Encounter for general psychiatric examination, requested by authority: Secondary | ICD-10-CM

## 2019-05-23 DIAGNOSIS — Z781 Physical restraint status: Secondary | ICD-10-CM

## 2019-05-23 DIAGNOSIS — G40909 Epilepsy, unspecified, not intractable, without status epilepticus: Secondary | ICD-10-CM | POA: Diagnosis present

## 2019-05-23 DIAGNOSIS — R451 Restlessness and agitation: Secondary | ICD-10-CM | POA: Diagnosis present

## 2019-05-23 DIAGNOSIS — Z888 Allergy status to other drugs, medicaments and biological substances status: Secondary | ICD-10-CM

## 2019-05-23 DIAGNOSIS — R4689 Other symptoms and signs involving appearance and behavior: Secondary | ICD-10-CM

## 2019-05-23 DIAGNOSIS — F319 Bipolar disorder, unspecified: Secondary | ICD-10-CM | POA: Diagnosis present

## 2019-05-23 DIAGNOSIS — K3532 Acute appendicitis with perforation and localized peritonitis, without abscess: Principal | ICD-10-CM | POA: Diagnosis present

## 2019-05-23 DIAGNOSIS — F840 Autistic disorder: Secondary | ICD-10-CM | POA: Diagnosis present

## 2019-05-23 DIAGNOSIS — E781 Pure hyperglyceridemia: Secondary | ICD-10-CM | POA: Diagnosis present

## 2019-05-23 DIAGNOSIS — K3580 Unspecified acute appendicitis: Secondary | ICD-10-CM | POA: Diagnosis present

## 2019-05-23 DIAGNOSIS — F72 Severe intellectual disabilities: Secondary | ICD-10-CM | POA: Diagnosis present

## 2019-05-23 DIAGNOSIS — Z79899 Other long term (current) drug therapy: Secondary | ICD-10-CM

## 2019-05-23 HISTORY — PX: LAPAROSCOPIC APPENDECTOMY: SHX408

## 2019-05-23 LAB — RESPIRATORY PANEL BY RT PCR (FLU A&B, COVID)
Influenza A by PCR: NEGATIVE
Influenza B by PCR: NEGATIVE
SARS Coronavirus 2 by RT PCR: NEGATIVE

## 2019-05-23 LAB — CBC WITH DIFFERENTIAL/PLATELET
Abs Immature Granulocytes: 0.02 10*3/uL (ref 0.00–0.07)
Basophils Absolute: 0 10*3/uL (ref 0.0–0.1)
Basophils Relative: 0 %
Eosinophils Absolute: 0.1 10*3/uL (ref 0.0–0.5)
Eosinophils Relative: 1 %
HCT: 37.6 % — ABNORMAL LOW (ref 39.0–52.0)
Hemoglobin: 11.8 g/dL — ABNORMAL LOW (ref 13.0–17.0)
Immature Granulocytes: 0 %
Lymphocytes Relative: 41 %
Lymphs Abs: 3.2 10*3/uL (ref 0.7–4.0)
MCH: 29.9 pg (ref 26.0–34.0)
MCHC: 31.4 g/dL (ref 30.0–36.0)
MCV: 95.4 fL (ref 80.0–100.0)
Monocytes Absolute: 0.7 10*3/uL (ref 0.1–1.0)
Monocytes Relative: 9 %
Neutro Abs: 3.7 10*3/uL (ref 1.7–7.7)
Neutrophils Relative %: 49 %
Platelets: 295 10*3/uL (ref 150–400)
RBC: 3.94 MIL/uL — ABNORMAL LOW (ref 4.22–5.81)
RDW: 11.4 % — ABNORMAL LOW (ref 11.5–15.5)
WBC: 7.7 10*3/uL (ref 4.0–10.5)
nRBC: 0 % (ref 0.0–0.2)

## 2019-05-23 LAB — COMPREHENSIVE METABOLIC PANEL
ALT: 32 U/L (ref 0–44)
AST: 45 U/L — ABNORMAL HIGH (ref 15–41)
Albumin: 3.1 g/dL — ABNORMAL LOW (ref 3.5–5.0)
Alkaline Phosphatase: 52 U/L (ref 38–126)
Anion gap: 8 (ref 5–15)
BUN: 8 mg/dL (ref 6–20)
CO2: 30 mmol/L (ref 22–32)
Calcium: 8.9 mg/dL (ref 8.9–10.3)
Chloride: 103 mmol/L (ref 98–111)
Creatinine, Ser: 1.04 mg/dL (ref 0.61–1.24)
GFR calc Af Amer: >60 mL/min (ref >60)
GFR calc non Af Amer: >60 mL/min (ref >60)
Glucose, Bld: 84 mg/dL (ref 70–99)
Potassium: 4.2 mmol/L (ref 3.5–5.1)
Sodium: 141 mmol/L (ref 135–145)
Total Bilirubin: 0.4 mg/dL (ref 0.3–1.2)
Total Protein: 7 g/dL (ref 6.5–8.1)

## 2019-05-23 LAB — ACETAMINOPHEN LEVEL: Acetaminophen (Tylenol), Serum: <10 ug/mL — ABNORMAL LOW (ref 10–30)

## 2019-05-23 LAB — SALICYLATE LEVEL: Salicylate Lvl: <7 mg/dL — ABNORMAL LOW (ref 7.0–30.0)

## 2019-05-23 LAB — VALPROIC ACID LEVEL: Valproic Acid Lvl: 79 ug/mL (ref 50.0–100.0)

## 2019-05-23 SURGERY — APPENDECTOMY, LAPAROSCOPIC
Anesthesia: General

## 2019-05-23 MED ORDER — OXYCODONE HCL 5 MG PO TABS: 5.0000 mg | ORAL_TABLET | Freq: Once | ORAL | Status: DC | PRN

## 2019-05-23 MED ORDER — ACETAMINOPHEN 325 MG PO TABS: 650.0000 mg | ORAL_TABLET | Freq: Four times a day (QID) | ORAL | Status: DC | PRN

## 2019-05-23 MED ORDER — DEXAMETHASONE SODIUM PHOSPHATE 10 MG/ML IJ SOLN
INTRAMUSCULAR | Status: DC | PRN
  Administered 2019-05-23: 10 mg via INTRAVENOUS

## 2019-05-23 MED ORDER — STERILE WATER FOR INJECTION IJ SOLN
INTRAMUSCULAR | Status: AC
  Filled 2019-05-23: qty 10

## 2019-05-23 MED ORDER — ACETAMINOPHEN 500 MG PO TABS: 1000.0000 mg | ORAL_TABLET | Freq: Once | ORAL | Status: DC | PRN

## 2019-05-23 MED ORDER — MIDAZOLAM HCL 2 MG/2ML IJ SOLN
2.0000 mg | Freq: Once | INTRAMUSCULAR | Status: AC
  Administered 2019-05-23: 16:00:00 2 mg via INTRAMUSCULAR
  Filled 2019-05-23: qty 2

## 2019-05-23 MED ORDER — LACTATED RINGERS IV SOLN: INTRAVENOUS | Status: DC | PRN

## 2019-05-23 MED ORDER — PROPOFOL 10 MG/ML IV BOLUS
INTRAVENOUS | Status: DC | PRN
  Administered 2019-05-23: 100 mg via INTRAVENOUS

## 2019-05-23 MED ORDER — ACETAMINOPHEN 160 MG/5ML PO SOLN: 1000.0000 mg | Freq: Once | ORAL | Status: DC | PRN

## 2019-05-23 MED ORDER — FENTANYL CITRATE (PF) 100 MCG/2ML IJ SOLN: 25.0000 ug | INTRAMUSCULAR | Status: DC | PRN

## 2019-05-23 MED ORDER — DIPHENHYDRAMINE HCL 50 MG/ML IJ SOLN
25.0000 mg | Freq: Once | INTRAMUSCULAR | Status: AC
  Administered 2019-05-23: 16:00:00 25 mg via INTRAMUSCULAR
  Filled 2019-05-23: qty 1

## 2019-05-23 MED ORDER — ACETAMINOPHEN 650 MG RE SUPP: 650.0000 mg | Freq: Four times a day (QID) | RECTAL | Status: DC | PRN

## 2019-05-23 MED ORDER — POLYETHYLENE GLYCOL 3350 17 G PO PACK: 17.0000 g | PACK | Freq: Every day | ORAL | Status: DC | PRN

## 2019-05-23 MED ORDER — BUPIVACAINE HCL (PF) 0.25 % IJ SOLN
INTRAMUSCULAR | Status: AC
  Filled 2019-05-23: qty 30

## 2019-05-23 MED ORDER — METOPROLOL TARTRATE 5 MG/5ML IV SOLN: 5.0000 mg | Freq: Four times a day (QID) | INTRAVENOUS | Status: DC | PRN

## 2019-05-23 MED ORDER — SODIUM CHLORIDE 0.9 % IV SOLN
2.0000 g | Freq: Once | INTRAVENOUS | Status: DC
  Filled 2019-05-23: qty 20

## 2019-05-23 MED ORDER — ONDANSETRON HCL 4 MG/2ML IJ SOLN: 4.0000 mg | Freq: Four times a day (QID) | INTRAMUSCULAR | Status: DC | PRN

## 2019-05-23 MED ORDER — MEPERIDINE HCL 25 MG/ML IJ SOLN
INTRAMUSCULAR | Status: AC
  Filled 2019-05-23: qty 1

## 2019-05-23 MED ORDER — ONDANSETRON 4 MG PO TBDP: 4.0000 mg | ORAL_TABLET | Freq: Three times a day (TID) | ORAL | Status: DC | PRN

## 2019-05-23 MED ORDER — MIDAZOLAM HCL 2 MG/2ML IJ SOLN
INTRAMUSCULAR | Status: AC
  Filled 2019-05-23: qty 2

## 2019-05-23 MED ORDER — OXYCODONE HCL 5 MG PO TABS: 5.0000 mg | ORAL_TABLET | Freq: Four times a day (QID) | ORAL | Status: DC | PRN

## 2019-05-23 MED ORDER — STERILE WATER FOR INJECTION IJ SOLN
INTRAMUSCULAR | Status: AC
  Administered 2019-05-23: 13:00:00 1.2 mL
  Filled 2019-05-23: qty 10

## 2019-05-23 MED ORDER — HYDROMORPHONE HCL 1 MG/ML IJ SOLN
1.0000 mg | Freq: Once | INTRAMUSCULAR | Status: AC
  Administered 2019-05-23: 18:00:00 1 mg via SUBCUTANEOUS
  Filled 2019-05-23: qty 1

## 2019-05-23 MED ORDER — TRAZODONE HCL 50 MG PO TABS
100.0000 mg | ORAL_TABLET | Freq: Two times a day (BID) | ORAL | Status: DC
  Administered 2019-05-24 – 2019-05-26 (×6): 100 mg via ORAL
  Filled 2019-05-23 (×6): qty 2

## 2019-05-23 MED ORDER — ACETAMINOPHEN 10 MG/ML IV SOLN: 1000.0000 mg | Freq: Once | INTRAVENOUS | Status: DC | PRN

## 2019-05-23 MED ORDER — KETAMINE HCL 100 MG/ML IJ SOLN
INTRAMUSCULAR | Status: AC
  Filled 2019-05-23: qty 1

## 2019-05-23 MED ORDER — ZIPRASIDONE MESYLATE 20 MG IM SOLR
20.0000 mg | Freq: Once | INTRAMUSCULAR | Status: AC
  Administered 2019-05-23: 20 mg via INTRAMUSCULAR
  Filled 2019-05-23: qty 20

## 2019-05-23 MED ORDER — SODIUM CHLORIDE 0.9 % IV SOLN: 12.5000 mg/h | Freq: Once | INTRAVENOUS | Status: DC

## 2019-05-23 MED ORDER — ONDANSETRON 4 MG PO TBDP: 4.0000 mg | ORAL_TABLET | Freq: Four times a day (QID) | ORAL | Status: DC | PRN

## 2019-05-23 MED ORDER — KETAMINE HCL 100 MG/ML IJ SOLN
INTRAMUSCULAR | Status: DC | PRN
  Administered 2019-05-23: 250 mg via INTRAMUSCULAR

## 2019-05-23 MED ORDER — MIDAZOLAM HCL 5 MG/5ML IJ SOLN: 2.0000 mg | Freq: Once | INTRAMUSCULAR | Status: DC

## 2019-05-23 MED ORDER — CLONAZEPAM 0.5 MG PO TABS
1.0000 mg | ORAL_TABLET | Freq: Every day | ORAL | Status: DC
  Administered 2019-05-23 – 2019-05-25 (×3): 1 mg via ORAL
  Filled 2019-05-23 (×3): qty 2

## 2019-05-23 MED ORDER — SUCCINYLCHOLINE CHLORIDE 20 MG/ML IJ SOLN
INTRAMUSCULAR | Status: DC | PRN
  Administered 2019-05-23: 100 mg via INTRAVENOUS

## 2019-05-23 MED ORDER — SODIUM CHLORIDE 0.9 % IR SOLN
Status: DC | PRN
  Administered 2019-05-23: 1000 mL

## 2019-05-23 MED ORDER — MIDAZOLAM HCL 5 MG/5ML IJ SOLN
2.0000 mg | Freq: Once | INTRAMUSCULAR | Status: AC
  Administered 2019-05-23: 13:00:00 2 mg via INTRAMUSCULAR
  Filled 2019-05-23: qty 2

## 2019-05-23 MED ORDER — ONDANSETRON HCL 4 MG/2ML IJ SOLN
INTRAMUSCULAR | Status: DC | PRN
  Administered 2019-05-23: 4 mg via INTRAVENOUS

## 2019-05-23 MED ORDER — DIVALPROEX SODIUM ER 500 MG PO TB24
500.0000 mg | ORAL_TABLET | Freq: Every day | ORAL | Status: DC
  Administered 2019-05-24 – 2019-05-26 (×3): 500 mg via ORAL
  Filled 2019-05-23 (×3): qty 1

## 2019-05-23 MED ORDER — MIDAZOLAM HCL 2 MG/2ML IJ SOLN: 4.0000 mg | Freq: Once | INTRAMUSCULAR | Status: DC

## 2019-05-23 MED ORDER — MEPERIDINE HCL 25 MG/ML IJ SOLN
6.2500 mg | INTRAMUSCULAR | Status: DC | PRN
  Administered 2019-05-23: 22:00:00 12.5 mg via INTRAVENOUS

## 2019-05-23 MED ORDER — SODIUM CHLORIDE 0.9 % IV SOLN
2.0000 g | Freq: Once | INTRAVENOUS | Status: AC
  Administered 2019-05-23: 21:00:00 2 g via INTRAVENOUS

## 2019-05-23 MED ORDER — DIVALPROEX SODIUM ER 500 MG PO TB24
1500.0000 mg | ORAL_TABLET | Freq: Every day | ORAL | Status: DC
  Administered 2019-05-24 – 2019-05-25 (×3): 1500 mg via ORAL
  Filled 2019-05-23 (×3): qty 3

## 2019-05-23 MED ORDER — CLONAZEPAM 0.5 MG PO TABS
0.5000 mg | ORAL_TABLET | ORAL | Status: DC
  Administered 2019-05-24 – 2019-05-26 (×5): 0.5 mg via ORAL
  Filled 2019-05-23 (×5): qty 1

## 2019-05-23 MED ORDER — METRONIDAZOLE IN NACL 5-0.79 MG/ML-% IV SOLN: 500.0000 mg | Freq: Once | INTRAVENOUS | Status: DC

## 2019-05-23 MED ORDER — ROCURONIUM BROMIDE 50 MG/5ML IV SOSY
PREFILLED_SYRINGE | INTRAVENOUS | Status: DC | PRN
  Administered 2019-05-23: 10 mg via INTRAVENOUS
  Administered 2019-05-23: 60 mg via INTRAVENOUS
  Administered 2019-05-23: 20 mg via INTRAVENOUS

## 2019-05-23 MED ORDER — 0.9 % SODIUM CHLORIDE (POUR BTL) OPTIME
TOPICAL | Status: DC | PRN
  Administered 2019-05-23: 1000 mL

## 2019-05-23 MED ORDER — NALTREXONE HCL 50 MG PO TABS
25.0000 mg | ORAL_TABLET | Freq: Two times a day (BID) | ORAL | Status: DC
  Administered 2019-05-24: 01:00:00 25 mg via ORAL
  Filled 2019-05-23 (×3): qty 1

## 2019-05-23 MED ORDER — HYDROXYZINE HCL 25 MG PO TABS: 50.0000 mg | ORAL_TABLET | Freq: Three times a day (TID) | ORAL | Status: DC | PRN

## 2019-05-23 MED ORDER — FENTANYL CITRATE (PF) 250 MCG/5ML IJ SOLN
INTRAMUSCULAR | Status: AC
  Filled 2019-05-23: qty 5

## 2019-05-23 MED ORDER — FENTANYL CITRATE (PF) 250 MCG/5ML IJ SOLN
INTRAMUSCULAR | Status: DC | PRN
  Administered 2019-05-23: 100 ug via INTRAVENOUS

## 2019-05-23 MED ORDER — KCL IN DEXTROSE-NACL 20-5-0.45 MEQ/L-%-% IV SOLN
INTRAVENOUS | Status: DC
  Filled 2019-05-23 (×4): qty 1000

## 2019-05-23 MED ORDER — ZIPRASIDONE HCL 40 MG PO CAPS: 60.0000 mg | ORAL_CAPSULE | Freq: Two times a day (BID) | ORAL | Status: DC

## 2019-05-23 MED ORDER — MORPHINE SULFATE (PF) 2 MG/ML IV SOLN
2.0000 mg | INTRAVENOUS | Status: DC | PRN
  Administered 2019-05-23 – 2019-05-24 (×2): 2 mg via INTRAVENOUS
  Filled 2019-05-23 (×2): qty 1

## 2019-05-23 MED ORDER — SUGAMMADEX SODIUM 200 MG/2ML IV SOLN
INTRAVENOUS | Status: DC | PRN
  Administered 2019-05-23: 200 mg via INTRAVENOUS

## 2019-05-23 MED ORDER — DIVALPROEX SODIUM ER 500 MG PO TB24: 500.0000 mg | ORAL_TABLET | Freq: Every day | ORAL | Status: DC

## 2019-05-23 MED ORDER — ATORVASTATIN CALCIUM 10 MG PO TABS
20.0000 mg | ORAL_TABLET | Freq: Every day | ORAL | Status: DC
  Administered 2019-05-24 – 2019-05-26 (×3): 20 mg via ORAL
  Filled 2019-05-23 (×3): qty 2

## 2019-05-23 MED ORDER — OXYCODONE HCL 5 MG/5ML PO SOLN: 5.0000 mg | Freq: Once | ORAL | Status: DC | PRN

## 2019-05-23 MED ORDER — OLANZAPINE 5 MG PO TBDP: 10.0000 mg | ORAL_TABLET | Freq: Once | ORAL | Status: DC

## 2019-05-23 MED ORDER — PROPOFOL 10 MG/ML IV BOLUS
INTRAVENOUS | Status: AC
  Filled 2019-05-23: qty 20

## 2019-05-23 MED ORDER — ZIPRASIDONE MESYLATE 20 MG IM SOLR
20.0000 mg | Freq: Once | INTRAMUSCULAR | Status: AC
  Administered 2019-05-23: 13:00:00 20 mg via INTRAMUSCULAR
  Filled 2019-05-23: qty 20

## 2019-05-23 MED ORDER — MELATONIN 3 MG PO TABS
3.0000 mg | ORAL_TABLET | Freq: Two times a day (BID) | ORAL | Status: DC
  Administered 2019-05-24 – 2019-05-26 (×6): 3 mg via ORAL
  Filled 2019-05-23 (×7): qty 1

## 2019-05-23 MED ORDER — MIDAZOLAM HCL 5 MG/5ML IJ SOLN
INTRAMUSCULAR | Status: DC | PRN
  Administered 2019-05-23: 2 mg via INTRAVENOUS

## 2019-05-23 SURGICAL SUPPLY — 46 items
ADH SKN CLS APL DERMABOND .7 (GAUZE/BANDAGES/DRESSINGS) ×1
ADH SKN CLS LQ APL DERMABOND (GAUZE/BANDAGES/DRESSINGS) ×1
APL PRP STRL LF DISP 70% ISPRP (MISCELLANEOUS) ×1
APPLIER CLIP 5 13 M/L LIGAMAX5 (MISCELLANEOUS)
APR CLP MED LRG 5 ANG JAW (MISCELLANEOUS)
BAG SPEC RTRVL 10 TROC 200 (ENDOMECHANICALS) ×1
CANISTER SUCT 3000ML PPV (MISCELLANEOUS) ×3 IMPLANT
CHLORAPREP W/TINT 26 (MISCELLANEOUS) ×3 IMPLANT
CLIP APPLIE 5 13 M/L LIGAMAX5 (MISCELLANEOUS) IMPLANT
CLIP VESOLOCK XL 6/CT (CLIP) ×3 IMPLANT
COVER SURGICAL LIGHT HANDLE (MISCELLANEOUS) ×3 IMPLANT
COVER WAND RF STERILE (DRAPES) ×3 IMPLANT
DERMABOND ADHESIVE PROPEN (GAUZE/BANDAGES/DRESSINGS) ×2
DERMABOND ADVANCED (GAUZE/BANDAGES/DRESSINGS) ×2
DERMABOND ADVANCED .7 DNX12 (GAUZE/BANDAGES/DRESSINGS) ×1 IMPLANT
DERMABOND ADVANCED .7 DNX6 (GAUZE/BANDAGES/DRESSINGS) ×1 IMPLANT
ELECT REM PT RETURN 9FT ADLT (ELECTROSURGICAL) ×3
ELECTRODE REM PT RTRN 9FT ADLT (ELECTROSURGICAL) ×1 IMPLANT
ENDOLOOP SUT PDS II  0 18 (SUTURE)
ENDOLOOP SUT PDS II 0 18 (SUTURE) IMPLANT
GLOVE BIOGEL PI IND STRL 7.0 (GLOVE) ×1 IMPLANT
GLOVE BIOGEL PI INDICATOR 7.0 (GLOVE) ×2
GLOVE SURG SS PI 7.0 STRL IVOR (GLOVE) ×3 IMPLANT
GOWN STRL REUS W/ TWL LRG LVL3 (GOWN DISPOSABLE) ×3 IMPLANT
GOWN STRL REUS W/TWL LRG LVL3 (GOWN DISPOSABLE) ×9
GRASPER SUT TROCAR 14GX15 (MISCELLANEOUS) ×3 IMPLANT
KIT BASIN OR (CUSTOM PROCEDURE TRAY) ×3 IMPLANT
KIT TURNOVER KIT B (KITS) ×3 IMPLANT
NEEDLE 22X1 1/2 (OR ONLY) (NEEDLE) ×3 IMPLANT
NS IRRIG 1000ML POUR BTL (IV SOLUTION) ×3 IMPLANT
PAD ARMBOARD 7.5X6 YLW CONV (MISCELLANEOUS) ×6 IMPLANT
POUCH RETRIEVAL ECOSAC 10 (ENDOMECHANICALS) ×1 IMPLANT
POUCH RETRIEVAL ECOSAC 10MM (ENDOMECHANICALS) ×2
SCISSORS LAP 5X35 DISP (ENDOMECHANICALS) ×3 IMPLANT
SET IRRIG TUBING LAPAROSCOPIC (IRRIGATION / IRRIGATOR) ×3 IMPLANT
SET TUBE SMOKE EVAC HIGH FLOW (TUBING) ×3 IMPLANT
SLEEVE ENDOPATH XCEL 5M (ENDOMECHANICALS) ×3 IMPLANT
SPECIMEN JAR SMALL (MISCELLANEOUS) ×3 IMPLANT
SUT MNCRL AB 4-0 PS2 18 (SUTURE) ×3 IMPLANT
TOWEL GREEN STERILE (TOWEL DISPOSABLE) ×3 IMPLANT
TOWEL GREEN STERILE FF (TOWEL DISPOSABLE) ×3 IMPLANT
TRAY FOLEY W/BAG SLVR 14FR (SET/KITS/TRAYS/PACK) ×3 IMPLANT
TRAY LAPAROSCOPIC MC (CUSTOM PROCEDURE TRAY) ×3 IMPLANT
TROCAR XCEL 12X100 BLDLESS (ENDOMECHANICALS) ×3 IMPLANT
TROCAR XCEL NON-BLD 5MMX100MML (ENDOMECHANICALS) ×3 IMPLANT
WATER STERILE IRR 1000ML POUR (IV SOLUTION) ×3 IMPLANT

## 2019-05-23 NOTE — ED Triage Notes (Signed)
Pt sent from group home. Pt not cooperating today and refusing to take medications last night and today. Pt has been hitting himself in the head and biting himself.

## 2019-05-23 NOTE — Transfer of Care (Signed)
Immediate Anesthesia Transfer of Care Note  Patient: Grayer Sproles  Procedure(s) Performed: APPENDECTOMY LAPAROSCOPIC (N/A )  Patient Location: PACU  Anesthesia Type:General  Level of Consciousness: drowsy  Airway & Oxygen Therapy: Patient Spontanous Breathing and Patient connected to face mask oxygen  Post-op Assessment: Report given to RN and Post -op Vital signs reviewed and stable  Post vital signs: Reviewed and stable  Last Vitals:  Vitals Value Taken Time  BP 151/68 05/23/19 2203  Temp    Pulse 94 05/23/19 2203  Resp 24 05/23/19 2203  SpO2 99 % 05/23/19 2203  Vitals shown include unvalidated device data.  Last Pain:  Vitals:   05/23/19 1229  TempSrc: Temporal         Complications: No apparent anesthesia complications

## 2019-05-23 NOTE — ED Notes (Signed)
[facility] remains at bedside. 

## 2019-05-23 NOTE — BH Assessment (Signed)
Tele Assessment Note   Patient Name: Paul Hendricks MRN: [id number] Referring Physician: Melene Planan Floyd Location of Patient: [facility] Location of Provider: Behavioral Health TTS Department  Paul Hendricks is an 28 y.o. male who is autistic, severe and non-verbal.  Patient was brought into the ED on IVC because he has refused to take his behavioral health and seizure medication for the past two days and he has been self-damaging>  He has been banging his head and biting himself.  Group home staff reported that his behavior is escalating and that they called his psych provider's office, [facility] and was advised to have the patient place d on IVC.  Since patient is unable to communicate, TTS contacted both the group home Paul Hendricks(Paul Hendricks at [phone] and his mother, Paul Hendricks at [phone], for collateral information.  Patient has been living in group homes since the age of 72twelve years old and he has been in his current group home for about ten years.  He has never been psychiatrically hospitalized according to group home and his behaviors have been managed on an outpatient basis with medications.  For the most part, patient manages fairly well.  Group Home states that patient has been having dental issues and they have taken him to the dentist on three occasions in the past year and she states that patient has not been cooperative with receiving care.  She believes that his teeth are bothering him.  She states that he was given an anti-biotic for his teeth, but he has been refusing to take it.  As a result of his dental issues, he has not been eating and is very selective about what he will drink and his fluid intake is decreasing as well.  She states that he has refused his behavior medication as well as his seizure medication for the past two days  As a result, his behavior has been escalating.  She states that he is generally not a danger to others, but is very self-damaging and hits  his head on the wall and he hits and bites himself.  She states that not only is she scared that he will do something to harm himself, but she is concerned that he is going to start having seizures without his medication.  She states that she feels like the hospital has more ways to get his medications in him versus them having to count on patient to be willing to take his medications.  Patient has been agitated since he was brought to the ED and he has received sedation (Geodon) so TTS was not able to assess patient and had to rely on collateral information.   Diagnosis: F84 Autism Spectrum Disorder Severe  Past Medical History:  Past Medical History:  Diagnosis Date  . Anxiety   . Autism disorder    WITH BEHAVIORAL ISSUES  . Seizures (HCC)    LAST SEIZURE 5-6 YRS AGO    Past Surgical History:  Procedure Laterality Date  . TOOTH EXTRACTION N/A 12/23/2014   Procedure: EXTRACTION  OF TEETH 9,62,95,281,16,17,32;  Surgeon: Lincoln Brighamhristopher [place], DDS;  [facility];  Service: Oral Surgery;  Laterality: N/A;    Family History: No family history on file.  Social History:  reports that he has never smoked. He has never used smokeless tobacco. He reports that he does not drink alcohol or use drugs.  Additional Social History:  Alcohol / Drug Use Pain Medications: see MAR Prescriptions: see MAR Over the Counter: see MAR Longest period of  sobriety (when/how long): NA  CIWA: CIWA-Ar BP: 100/85 Pulse Rate: (!) 101 COWS:    Allergies:  Allergies  Allergen Reactions  . Atorvastatin Rash    Home Medications: (Not in a hospital admission)   OB/GYN Status:  No LMP for male patient.  General Assessment Data Location of Assessment: [facility] ED TTS Assessment: In system Is this a Tele or Face-to-Face Assessment?: Tele Assessment Is this an Initial Assessment or a Re-assessment for this encounter?: Initial Assessment Patient Accompanied by:: Other(Police) Language Other than English: No Living  Arrangements: In Group Home: (Comment: Name of Group Home) What gender do you identify as?: Male Marital status: Single Living Arrangements: Group Home Can pt return to current living arrangement?: Yes Admission Status: Involuntary Petitioner: Other(group home) Is patient capable of signing voluntary admission?: No Referral Source: Other(group home) Insurance type: Medicaid     Crisis Care Plan Living Arrangements: Group Home Legal Guardian: Mother Name of Psychiatrist: Akintayo Name of Therapist: none  Education Status Is patient currently in school?: No Is the patient employed, unemployed or receiving disability?: Receiving disability income  Risk to self with the past 6 months Suicidal Ideation: No Has patient been a risk to self within the past 6 months prior to admission? : No Suicidal Intent: No Has patient had any suicidal intent within the past 6 months prior to admission? : No Is patient at risk for suicide?: No Suicidal Plan?: No Has patient had any suicidal plan within the past 6 months prior to admission? : No Access to Means: No What has been your use of drugs/alcohol within the last 12 months?: none Previous Attempts/Gestures: No How many times?: 0 Other Self Harm Risks: self-damaging behavior Triggers for Past Attempts: None known Intentional Self Injurious Behavior: Damaging(head banging and biting) Family Suicide History: No Recent stressful life event(s): Other (Comment)(nothing reported) Persecutory voices/beliefs?: No Depression: No Substance abuse history and/or treatment for substance abuse?: No Suicide prevention information given to non-admitted patients: Not applicable  Risk to Others within the past 6 months Homicidal Ideation: No Does patient have any lifetime risk of violence toward others beyond the six months prior to admission? : No Thoughts of Harm to Others: No Current Homicidal Intent: No Current Homicidal Plan: No Access to  Homicidal Means: No Identified Victim: none History of harm to others?: No Assessment of Violence: None Noted Violent Behavior Description: none Does patient have access to weapons?: No Criminal Charges Pending?: No Does patient have a court date: No Is patient on probation?: No  Psychosis Hallucinations: (UTA) Delusions: (UTA)  Mental Status Report Appearance/Hygiene: Unremarkable Eye Contact: Good Motor Activity: Agitation, Restlessness Speech: Other (Comment) Level of Consciousness: (patient is non-verbal) Mood: Labile Affect: Irritable, Labile Anxiety Level: (UTA) Thought Processes: Unable to Assess Judgement: Impaired Orientation: Unable to assess Obsessive Compulsive Thoughts/Behaviors: Moderate  Cognitive Functioning Concentration: Unable to Assess Memory: Unable to Assess Is patient IDD: Yes Level of Function: Severe Is IQ score available?: No Insight: Unable to Assess Impulse Control: Unable to Assess Appetite: Poor Have you had any weight changes? : Loss Amount of the weight change? (lbs): (amount unknown) Sleep: Decreased Total Hours of Sleep: (unknown) Vegetative Symptoms: Unable to Assess  ADLScreening [facility] Assessment Services) Patient's cognitive ability adequate to safely complete daily activities?: No Patient able to express need for assistance with ADLs?: No Independently performs ADLs?: No  Prior Inpatient Therapy Prior Inpatient Therapy: No  Prior Outpatient Therapy Prior Outpatient Therapy: Yes Prior Therapy Dates: active Prior Therapy Facilty/Provider(s): Dr Jannifer Franklin Reason  for Treatment: Autism and Behavior Control Does patient have an ACCT team?: No Does patient have Intensive In-House Services?  : No Does patient have [facility] services? : No Does patient have P4CC services?: No  ADL Screening (condition at time of admission) Patient's cognitive ability adequate to safely complete daily activities?: No Is the patient deaf or have  difficulty hearing?: No Does the patient have difficulty seeing, even when wearing glasses/contacts?: No Does the patient have difficulty concentrating, remembering, or making decisions?: Yes Patient able to express need for assistance with ADLs?: No Does the patient have difficulty dressing or bathing?: Yes Independently performs ADLs?: No Communication: Dependent Is this a change from baseline?: Pre-admission baseline Dressing (OT): Dependent Is this a change from baseline?: Pre-admission baseline Grooming: Dependent Is this a change from baseline?: Pre-admission baseline Feeding: Needs assistance Is this a change from baseline?: Pre-admission baseline Bathing: Needs assistance Is this a change from baseline?: Pre-admission baseline Toileting: Needs assistance Is this a change from baseline?: Pre-admission baseline In/Out Bed: Appropriate for developmental age Is this a change from baseline?: Pre-admission baseline Walks in Home: Appropriate for developmental age Does the patient have difficulty walking or climbing stairs?: No Weakness of Legs: None Weakness of Arms/Hands: None  Home Assistive Devices/Equipment Home Assistive Devices/Equipment: None  Therapy Consults (therapy consults require a physician order) PT Evaluation Needed: No OT Evalulation Needed: No SLP Evaluation Needed: No Abuse/Neglect Assessment (Assessment to be complete while patient is alone) Abuse/Neglect Assessment Can Be Completed: Yes Physical Abuse: Denies Verbal Abuse: Denies Sexual Abuse: Denies Exploitation of patient/patient's resources: Denies Self-Neglect: Denies Values / Beliefs Cultural Requests During Hospitalization: None Spiritual Requests During Hospitalization: None Consults Spiritual Care Consult Needed: No Transition of Care Team Consult Needed: No Advance Directives (For Healthcare) Does Patient Have a Medical Advance Directive?: No Would patient like information on creating a  medical advance directive?: No - Patient declined Nutrition Screen- [facility] Adult/[facility]/AP Has the patient recently lost weight without trying?: Yes, 2-13 lbs.        Disposition: Per Mordecai Maes, overnight observation for safety, try to stabilize on medication and patient will be seen by provider for re-assessment in the morning   Disposition Initial Assessment Completed for this Encounter: Yes Patient referred to: (Per Mordecai Maes, overnight OBS)  This service was provided via telemedicine using a 2-way, interactive audio and video technology.  Names of all persons participating in this telemedicine service and their role in this encounter. Name: Paul Hendricks Role: Group Home Staff  Name: Paul Hendricks Role: mother/guardian  Name: Kasandra Knudsen Mallorie Norrod Role: TTS  Name:  Role:     Reatha Armour 05/23/2019 4:50 PM

## 2019-05-23 NOTE — BH Assessment (Signed)
[facility] Assessment Progress Note   Per Mordecai Maes, overnight observation for safety, try to stabilize on medication and patient will be seen by provider for re-assessment in the morning

## 2019-05-23 NOTE — ED Provider Notes (Signed)
[name] EMERGENCY DEPARTMENT Provider Note   CSN: [id number] Arrival date & time: 05/23/19  1157     History Chief Complaint  Patient presents with  . ivc  . Aggressive Behavior   Level 5 caveat secondary to autism, nonverbal  Paul Hendricks is a 28 y.o. male with history of autism, seizures, anxiety presents with behavioral issues and not taking his medication.  Patient has become violent toward himself and staff.  He has been hitting his head against the wall and hitting himself with his hand.  No reported syncope.  Patient has not taken his medication in 2 days.  Patient is nonverbal and unable to provide any history.  History obtained from accompanying police officers.  Patient is under IVC.  HPI     Past Medical History:  Diagnosis Date  . Anxiety   . Autism disorder    WITH BEHAVIORAL ISSUES  . Seizures (HCC)    LAST SEIZURE 5-6 YRS AGO    Patient Active Problem List   Diagnosis Date Noted  . Acute constipation 10/10/2018  . Atopic dermatitis 10/10/2018  . Hypertriglyceridemia 01/12/2017  . 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 Autistic disorder 04/22/2015  . Bipolar disorder (HCC) 03/05/2015  . Impacted teeth with abnormal position 12/23/2014    Past Surgical History:  Procedure Laterality Date  . TOOTH EXTRACTION N/A 12/23/2014   Procedure: EXTRACTION  OF TEETH 1,94,17,40;  Surgeon: Lincoln Brigham, DDS;  Location: [facility] OR;  Service: Oral Surgery;  Laterality: N/A;       No family history on file.  Social History   Tobacco Use  . Smoking status: Never Smoker  . Smokeless tobacco: Never Used  Substance Use Topics  . Alcohol use: No    Alcohol/week: 0.0 standard drinks  . Drug use: No    Home Medications Prior to Admission medications   Medication Sig Start Date End Date Taking? Authorizing Provider  atorvastatin (LIPITOR) 20 MG tablet Take 20 mg by mouth  daily.    [provider]  clonazePAM (KLONOPIN) 0.5 MG tablet Take 0.5-1 mg by mouth 2 (two) times daily. Take 1 tablet every morning, afternoon, and 2 tablets at bedtime    [provider]  divalproex (DEPAKOTE ER) 500 MG 24 hr tablet Take 1 tablet in AM, 3 tablets in PM 10/10/18   Van Clines, MD  hydrOXYzine (ATARAX/VISTARIL) 50 MG tablet Take 50 mg by mouth 3 (three) times daily as needed.    [provider]  Melatonin 5 MG TABS Take by mouth 2 (two) times daily.    [provider]  naltrexone (DEPADE) 50 MG tablet Take by mouth daily. Half tab 4 pm 8 pm    [provider]  ondansetron (ZOFRAN ODT) 4 MG disintegrating tablet Take 1 tablet (4 mg total) by mouth every 8 (eight) hours as needed for nausea or vomiting. 02/03/18   Alvira Monday, MD  traZODone (DESYREL) 100 MG tablet Take 100 mg by mouth 2 (two) times daily.     [provider]  ziprasidone (GEODON) 60 MG capsule Take 60 mg by mouth 2 (two) times daily with a meal.    [provider]    Allergies    Atorvastatin  Review of Systems   Review of Systems  Unable to perform ROS: Patient nonverbal (Autistic)    Physical Exam Updated Vital Signs BP 100/85   Pulse (!) 101   Temp (!) 96.8 F (  36 C) (Temporal)   Resp 16   SpO2 100%   Physical Exam Vitals and nursing note reviewed.  Constitutional:      General: He is not in acute distress.    Appearance: He is well-developed. He is not diaphoretic.  HENT:     Head: Normocephalic and atraumatic.     Mouth/Throat:     Pharynx: No oropharyngeal exudate.  Eyes:     General: No scleral icterus.       Right eye: No discharge.        Left eye: No discharge.     Conjunctiva/sclera: Conjunctivae normal.  Neck:     Thyroid: No thyromegaly.  Cardiovascular:     Rate and Rhythm: Normal rate and regular rhythm.     Heart sounds: Normal heart sounds. No murmur. No friction rub. No gallop.   Pulmonary:      Effort: Pulmonary effort is normal. No respiratory distress.     Breath sounds: Normal breath sounds. No stridor. No wheezing or rales.  Musculoskeletal:     Cervical back: Normal range of motion and neck supple.  Lymphadenopathy:     Cervical: No cervical adenopathy.  Skin:    General: Skin is warm and dry.     Coloration: Skin is not pale.     Findings: No rash.  Neurological:     Mental Status: He is alert.     Coordination: Coordination normal.  Psychiatric:     Comments: Patient pacing and yelling intermittently, uncooperative     ED Results / Procedures / Treatments   Labs (all labs ordered are listed, but only abnormal results are displayed) Labs Reviewed  COMPREHENSIVE METABOLIC PANEL - Abnormal; Notable for the following components:      Result Value   Albumin 3.1 (*)    AST 45 (*)    All other components within normal limits  CBC WITH DIFFERENTIAL/PLATELET - Abnormal; Notable for the following components:   RBC 3.94 (*)    Hemoglobin 11.8 (*)    HCT 37.6 (*)    RDW 11.4 (*)    All other components within normal limits  ACETAMINOPHEN LEVEL - Abnormal; Notable for the following components:   Acetaminophen (Tylenol), Serum <10 (*)    All other components within normal limits  SALICYLATE LEVEL - Abnormal; Notable for the following components:   Salicylate Lvl <1.1 (*)    All other components within normal limits  RESPIRATORY PANEL BY RT PCR (FLU A&B, COVID)  VALPROIC ACID LEVEL  RAPID URINE DRUG SCREEN, HOSP PERFORMED  URINALYSIS, ROUTINE W REFLEX MICROSCOPIC  POC OCCULT BLOOD, ED    EKG None  Radiology CT Head Wo Contrast  Result Date: 05/23/2019 CLINICAL DATA:  Head trauma. Hit head against wall. History of autism. EXAM: CT HEAD WITHOUT CONTRAST TECHNIQUE: Contiguous axial images were obtained from the base of the skull through the vertex without intravenous contrast. COMPARISON:  05/14/2019 FINDINGS: Brain: There is no evidence of acute infarct,  intracranial hemorrhage, mass, midline shift, or extra-axial fluid collection. The ventricles and sulci are normal. Vascular: No hyperdense vessel. Skull: No fracture or focal osseous lesion. Sinuses/Orbits: Trace right maxillary sinus mucosal thickening. Clear mastoid air cells. Unremarkable orbits. Other: Mild frontal scalp soft tissue thickening, also present previously. IMPRESSION: No evidence of acute intracranial abnormality. Electronically Signed   By: Logan Bores M.D.   On: 05/23/2019 14:40    Procedures Procedures (including critical care time)  Medications Ordered in ED Medications  sterile water (preservative  free) injection (has no administration in time range)  ziprasidone (GEODON) injection 20 mg (20 mg Intramuscular Given by Other 05/23/19 1245)  sterile water (preservative free) injection (1.2 mLs  Given 05/23/19 1245)  midazolam (VERSED) 5 MG/5ML injection 2 mg (2 mg Intramuscular Given 05/23/19 1244)  ziprasidone (GEODON) injection 20 mg (20 mg Intramuscular Given by Other 05/23/19 1541)  diphenhydrAMINE (BENADRYL) injection 25 mg (25 mg Intramuscular Given 05/23/19 1540)  midazolam (VERSED) injection 2 mg (2 mg Intramuscular Given 05/23/19 1541)    ED Course  I have reviewed the triage vital signs and the nursing notes.  Pertinent labs & imaging results that were available during my care of the patient were reviewed by me and considered in my medical decision making (see chart for details).    MDM Rules/Calculators/A&P                      Patient presenting [employer]PD under IVC for aggressive behavior toward self and others as well as not having medication since yesterday.  Patient requiring several doses of medication for agitation. Patient does have a drop in hemoglobin to 11.8.  Due to patient's behavior and uncooperative, will need to do occult stool card on stool instead of rectal exam.  CT abdomen pelvis is pending considering unable to complete abdominal exam or collect  any review of systems history from the patient.  CT head is negative.  Chest x-ray is also pending.  TTS consult pending medical clearance. At shift change, patient care transferred to [place], PA-C for continued evaluation, follow up of remaining imaging TTS and determination of disposition. I discussed patient case with Dr. Adela LankFloyd who guided the patient's management and agrees with plan.    Final Clinical Impression(s) / ED Diagnoses Final diagnoses:  Agitation  Involuntary commitment  Aggressive behavior    Rx / DC Orders ED Discharge Orders    None       Emi HolesLaw, Dionel Archey M, PA-C 05/23/19 1701    Melene PlanFloyd, Dan, DO 05/24/19 1518

## 2019-05-23 NOTE — Anesthesia Preprocedure Evaluation (Signed)
Anesthesia Evaluation    Reviewed: Unable to perform ROS - Chart review onlyPreop documentation limited or incomplete due to emergent nature of procedure.  Airway        Dental   Pulmonary           Cardiovascular      Neuro/Psych Seizures -,  PSYCHIATRIC DISORDERS Anxiety Bipolar Disorder Nonverbal autism   GI/Hepatic   Endo/Other    Renal/GU      Musculoskeletal   Abdominal   Peds  Hematology   Anesthesia Other Findings Patient arrived to short stay with multiple security guards and an ED RN. Patient presented to [facility] ED for involuntary commitment after refusing to take medications at group home. In medical eval found to have an appendicitis by CT exam. Psychiatry awaiting medical clearance prior to establishing care plan for patient. Per nursing, patient received doses of Haldol and Geodon in ED. Currently pacing the short stay room redirectable by security.   Reproductive/Obstetrics                             Anesthesia Physical Anesthesia Plan  ASA: III  Anesthesia Plan: General   Post-op Pain Management:    Induction:   PONV Risk Score and Plan: 2 and Ondansetron and Dexamethasone  Airway Management Planned: Oral ETT  Additional Equipment:   Intra-op Plan:   Post-operative Plan: Extubation in OR  Informed Consent:     History available from chart only and Only emergency history available  Plan Discussed with: Surgeon and CRNA  Anesthesia Plan Comments: (Preop IM ketamine and transfer to OR for IV placement, monitoring, and IV vs inhaled induction.)        Anesthesia Quick Evaluation

## 2019-05-23 NOTE — ED Provider Notes (Signed)
Care assumed at shift change from Law, PA-C, pending medical clearance. See her note for full HPI and workup. Briefly, pt presenting with agitation that began yesterday, and refusing to take medications. He lives in group home. [place] counselor IVC'd pt. Pt with low-functioning autism, nonverbal at baseline. Pending CT and TTS pending Physical Exam  BP 100/85   Pulse (!) 101   Temp (!) 96.8 F (36 C) (Temporal)   Resp 16   SpO2 100%   Physical Exam Vitals and nursing note reviewed.  Constitutional:      Appearance: He is well-developed.     Comments: Pt intermittently agitated, redirectable at times.   HENT:     Head: Atraumatic.  Eyes:     Conjunctiva/sclera: Conjunctivae normal.  Cardiovascular:     Rate and Rhythm: Normal rate.  Pulmonary:     Effort: Pulmonary effort is normal.  Neurological:     Mental Status: He is alert.     Results for orders placed or performed during the hospital encounter of 05/23/19  Respiratory Panel by RT PCR (Flu A&B, Covid) - Nasopharyngeal Swab   Specimen: Nasopharyngeal Swab  Result Value Ref Range   SARS Coronavirus 2 by RT PCR NEGATIVE NEGATIVE   Influenza A by PCR NEGATIVE NEGATIVE   Influenza B by PCR NEGATIVE NEGATIVE  Comprehensive metabolic panel  Result Value Ref Range   Sodium 141 135 - 145 mmol/L   Potassium 4.2 3.5 - 5.1 mmol/L   Chloride 103 98 - 111 mmol/L   CO2 30 22 - 32 mmol/L   Glucose, Bld 84 70 - 99 mg/dL   BUN 8 6 - 20 mg/dL   Creatinine, Ser 1.04 0.61 - 1.24 mg/dL   Calcium 8.9 8.9 - 10.3 mg/dL   Total Protein 7.0 6.5 - 8.1 g/dL   Albumin 3.1 (L) 3.5 - 5.0 g/dL   AST 45 (H) 15 - 41 U/L   ALT 32 0 - 44 U/L   Alkaline Phosphatase 52 38 - 126 U/L   Total Bilirubin 0.4 0.3 - 1.2 mg/dL   GFR calc non Af Amer >60 >60 mL/min   GFR calc Af Amer >60 >60 mL/min   Anion gap 8 5 - 15  CBC with Diff  Result Value Ref Range   WBC 7.7 4.0 - 10.5 K/uL   RBC 3.94 (L) 4.22 - 5.81 MIL/uL   Hemoglobin 11.8 (L) 13.0 - 17.0 g/dL    HCT 37.6 (L) 39.0 - 52.0 %   MCV 95.4 80.0 - 100.0 fL   MCH 29.9 26.0 - 34.0 pg   MCHC 31.4 30.0 - 36.0 g/dL   RDW 11.4 (L) 11.5 - 15.5 %   Platelets 295 150 - 400 K/uL   nRBC 0.0 0.0 - 0.2 %   Neutrophils Relative % 49 %   Neutro Abs 3.7 1.7 - 7.7 K/uL   Lymphocytes Relative 41 %   Lymphs Abs 3.2 0.7 - 4.0 K/uL   Monocytes Relative 9 %   Monocytes Absolute 0.7 0.1 - 1.0 K/uL   Eosinophils Relative 1 %   Eosinophils Absolute 0.1 0.0 - 0.5 K/uL   Basophils Relative 0 %   Basophils Absolute 0.0 0.0 - 0.1 K/uL   Immature Granulocytes 0 %   Abs Immature Granulocytes 0.02 0.00 - 0.07 K/uL  Acetaminophen level  Result Value Ref Range   Acetaminophen (Tylenol), Serum <10 (L) 10 - 30 ug/mL  Salicylate level  Result Value Ref Range   Salicylate Lvl <0.6 (L)  7.0 - 30.0 mg/dL  Valproic acid level  Result Value Ref Range   Valproic Acid Lvl 79 50.0 - 100.0 ug/mL   CT ABDOMEN PELVIS WO CONTRAST  Result Date: 05/23/2019 CLINICAL DATA:  28 year old male with history of anemia. Abnormal behavior in autistic patient. EXAM: CT ABDOMEN AND PELVIS WITHOUT CONTRAST TECHNIQUE: Multidetector CT imaging of the abdomen and pelvis was performed following the standard protocol without IV contrast. COMPARISON:  No priors. FINDINGS: Lower chest: Unremarkable. Hepatobiliary: No suspicious cystic or solid hepatic lesions are confidently identified on today's noncontrast CT examination. Unenhanced appearance of the gallbladder is normal. Pancreas: No definite pancreatic mass or peripancreatic fluid collections or inflammatory changes noted on today's noncontrast CT examination. Spleen: Unremarkable. Adrenals/Urinary Tract: Unenhanced appearance of the kidneys and bilateral adrenal glands is normal. No hydroureteronephrosis. Urinary bladder is normal in appearance. Stomach/Bowel: Normal appearance of the stomach. No pathologic dilatation of small bowel or colon. The appendix is dilated measuring up to 1.2 cm in  diameter and there are surrounding inflammatory changes and a trace volume of periappendiceal fluid, compatible with acute appendicitis. Appendix is retrocecal in position. Vascular/Lymphatic: No atherosclerotic calcifications in the abdominal or pelvic vasculature. No lymphadenopathy noted in the abdomen or pelvis. Reproductive: Prostate gland and seminal vesicles are unremarkable in appearance. Other: Trace volume of ascites in the low anatomic pelvis. No pneumoperitoneum. Musculoskeletal: Severe scoliosis of the thoracolumbar spine convex to the right in the thoracic region and to the left in the lumbar region. There are no aggressive appearing lytic or blastic lesions noted in the visualized portions of the skeleton. IMPRESSION: 1. Findings are compatible with an acute appendicitis with trace amount of periappendiceal fluid and trace volume of ascites, presumably reactive. No discrete periappendiceal abscess or signs of frank perforation or confidently identified at this time. Surgical consultation is strongly recommended. Critical Value/emergent results were called by telephone at the time of interpretation on 05/23/2019 at 5:37 pm to provider PA [place] Raeven Pint, who verbally acknowledged these results. Electronically Signed   By: Trudie Reedaniel  Entrikin M.D.   On: 05/23/2019 17:40   CT Head Wo Contrast  Result Date: 05/23/2019 CLINICAL DATA:  Head trauma. Hit head against wall. History of autism. EXAM: CT HEAD WITHOUT CONTRAST TECHNIQUE: Contiguous axial images were obtained from the base of the skull through the vertex without intravenous contrast. COMPARISON:  05/14/2019 FINDINGS: Brain: There is no evidence of acute infarct, intracranial hemorrhage, mass, midline shift, or extra-axial fluid collection. The ventricles and sulci are normal. Vascular: No hyperdense vessel. Skull: No fracture or focal osseous lesion. Sinuses/Orbits: Trace right maxillary sinus mucosal thickening. Clear mastoid air cells.  Unremarkable orbits. Other: Mild frontal scalp soft tissue thickening, also present previously. IMPRESSION: No evidence of acute intracranial abnormality. Electronically Signed   By: Sebastian AcheAllen  Grady M.D.   On: 05/23/2019 14:40   CT Head Wo Contrast  Result Date: 05/14/2019 CLINICAL DATA:  28 year old male with mental status change. Decreased appetite. Increasingly with lethargic. Vomited once this morning. EXAM: CT HEAD WITHOUT CONTRAST TECHNIQUE: Contiguous axial images were obtained from the base of the skull through the vertex without intravenous contrast. COMPARISON:  Head CT 02/03/2018. FINDINGS: Brain: No evidence of acute infarction, hemorrhage, hydrocephalus, extra-axial collection or mass lesion/mass effect. Vascular: No hyperdense vessel or unexpected calcification. Skull: Normal. Negative for fracture or focal lesion. Sinuses/Orbits: No acute finding. Other: None. IMPRESSION: 1. No acute intracranial abnormalities. Electronically Signed   By: Trudie Reedaniel  Entrikin M.D.   On: 05/14/2019 18:23   DG Chest  Portable 1 View  Result Date: 05/23/2019 CLINICAL DATA:  Medical screening. Autistic. Not taking medications. Appendectomy earlier today. EXAM: PORTABLE CHEST 1 VIEW COMPARISON:  Radiograph 09/17/2005 FINDINGS: The cardiomediastinal contours are normal. The lungs are clear. Pulmonary vasculature is normal. No consolidation, pleural effusion, or pneumothorax. Scoliotic curvature of the spine. No acute osseous abnormalities are seen. Free air under the right hemidiaphragm, likely related to appendectomy earlier this day. IMPRESSION: 1. No acute chest finding. 2. Free air under the right hemidiaphragm, likely related to appendectomy earlier this day. 3. Scoliosis. Electronically Signed   By: Narda Rutherford M.D.   On: 05/23/2019 23:11    ED Course/Procedures   Clinical Course as of May 22 1833  Wed May 23, 2019  1745 Radiologist called to report acute appendicitis on CT. Will dose with pain  medication and another dose of versed, as pt is agitated once more and walking the halls. Abx   [JR]  1823 Dr. Sheliah Hatch to evaluate pt   [JR]    Clinical Course User Index [JR] Jerrilyn Messinger, [place], PA-C    Procedures  MDM  CT with acute appendicitis without evidence of rupture. Suspect this is cause of pt's agitation, likely 2/t pain. Dr. Sheliah Hatch to perform appendectomy tonight. Pt's mother updated and agreeable with plan.       Alishea Beaudin, [place] N, PA-C 05/24/19 0001    Melene Plan, DO 05/24/19 1501

## 2019-05-23 NOTE — ED Notes (Signed)
Pt pacing and screaming loudly  Sitter a   The bedside   The pt has been seen by the general surgeon

## 2019-05-23 NOTE — Progress Notes (Signed)
Patient ID: Paul Hendricks, male   DOB: 09-27-1990, 28 y.o.   MRN: [id number]   I am recommending that patients home medication be reviewed and restarted as appropriate.

## 2019-05-23 NOTE — H&P (Signed)
Reason for Consult: agitation Referring Physician:  Deno Etienne, M.D.  Paul Hendricks is an 28 y.o. male.  HPI: 28 yo nonverbal autistic male found to have more aggression and agitation in the last 48 hours. A behavioral counselor went to evaluate him and IVC'ed him. He came here for medical clearance and on imaging, was found to have a dilated appendix.  Past Medical History:  Diagnosis Date  . Anxiety   . Autism disorder    WITH BEHAVIORAL ISSUES  . Seizures ([place])    LAST SEIZURE 5-6 YRS AGO    Past Surgical History:  Procedure Laterality Date  . TOOTH EXTRACTION N/A 12/23/2014   Procedure: EXTRACTION  OF TEETH 4,27,06,23;  Surgeon: Tamela Oddi, DDS;  Location: [facility];  Service: Oral Surgery;  Laterality: N/A;    No family history on file.  Social History:  reports that he has never smoked. He has never used smokeless tobacco. He reports that he does not drink alcohol or use drugs.  Allergies:  Allergies  Allergen Reactions  . Atorvastatin Rash    Medications: I have reviewed the patient's current medications.  Results for orders placed or performed during the hospital encounter of 05/23/19 (from the past 48 hour(s))  Respiratory Panel by RT PCR (Flu A&B, Covid) - Nasopharyngeal Swab     Status: None   Collection Time: 05/23/19  2:40 PM   Specimen: Nasopharyngeal Swab  Result Value Ref Range   SARS Coronavirus 2 by RT PCR NEGATIVE NEGATIVE    Comment: (NOTE) SARS-CoV-2 target nucleic acids are NOT DETECTED. The SARS-CoV-2 RNA is generally detectable in upper respiratoy specimens during the acute phase of infection. The lowest concentration of SARS-CoV-2 viral copies this assay can detect is 131 copies/mL. A negative result does not preclude SARS-Cov-2 infection and should not be used as the sole basis for treatment or other patient management decisions. A negative result may occur with  improper specimen collection/handling, submission of specimen other than  nasopharyngeal swab, presence of viral mutation(s) within the areas targeted by this assay, and inadequate number of viral copies (<131 copies/mL). A negative result must be combined with clinical observations, patient history, and epidemiological information. The expected result is Negative. Fact Sheet for Patients:  PinkCheek.be Fact Sheet for Healthcare Providers:  GravelBags.it This test is not yet ap proved or cleared by the [place] FDA and  has been authorized for detection and/or diagnosis of SARS-CoV-2 by FDA under an Emergency Use Authorization (EUA). This EUA will remain  in effect (meaning this test can be used) for the duration of the COVID-19 declaration under Section 564(b)(1) of the Act, 21 U.S.C. section 360bbb-3(b)(1), unless the authorization is terminated or revoked sooner.    Influenza A by PCR NEGATIVE NEGATIVE   Influenza B by PCR NEGATIVE NEGATIVE    Comment: (NOTE) The Xpert Xpress SARS-CoV-2/FLU/RSV assay is intended as an aid in  the diagnosis of influenza from Nasopharyngeal swab specimens and  should not be used as a sole basis for treatment. Nasal washings and  aspirates are unacceptable for Xpert Xpress SARS-CoV-2/FLU/RSV  testing. Fact Sheet for Patients: PinkCheek.be Fact Sheet for Healthcare Providers: GravelBags.it This test is not yet approved or cleared by the [place] FDA and  has been authorized for detection and/or diagnosis of SARS-CoV-2 by  FDA under an Emergency Use Authorization (EUA). This EUA will remain  in effect (meaning this test can be used) for the duration of the  Covid-19 declaration under Section 564(b)(1) of  the Act, 21  U.S.C. section 360bbb-3(b)(1), unless the authorization is  terminated or revoked. Performed at [facility], [address] [place], [place] [address]   Comprehensive  metabolic panel     Status: Abnormal   Collection Time: 05/23/19  3:04 PM  Result Value Ref Range   Sodium 141 135 - 145 mmol/L   Potassium 4.2 3.5 - 5.1 mmol/L   Chloride 103 98 - 111 mmol/L   CO2 30 22 - 32 mmol/L   Glucose, Bld 84 70 - 99 mg/dL   BUN 8 6 - 20 mg/dL   Creatinine, Ser 6.041.04 0.61 - 1.24 mg/dL   Calcium 8.9 8.9 - 54.010.3 mg/dL   Total Protein 7.0 6.5 - 8.1 g/dL   Albumin 3.1 (L) 3.5 - 5.0 g/dL   AST 45 (H) 15 - 41 U/L   ALT 32 0 - 44 U/L   Alkaline Phosphatase 52 38 - 126 U/L   Total Bilirubin 0.4 0.3 - 1.2 mg/dL   GFR calc non Af Amer >60 >60 mL/min   GFR calc Af Amer >60 >60 mL/min   Anion gap 8 5 - 15    Comment: Performed at [facility], [address] [place], [place] [address]  CBC with Diff     Status: Abnormal   Collection Time: 05/23/19  3:04 PM  Result Value Ref Range   WBC 7.7 4.0 - 10.5 K/uL   RBC 3.94 (L) 4.22 - 5.81 MIL/uL   Hemoglobin 11.8 (L) 13.0 - 17.0 g/dL   HCT 14.737.6 (L) 82.939.0 - 56.252.0 %   MCV 95.4 80.0 - 100.0 fL   MCH 29.9 26.0 - 34.0 pg   MCHC 31.4 30.0 - 36.0 g/dL   RDW 13.011.4 (L) 86.511.5 - 78.415.5 %   Platelets 295 150 - 400 K/uL   nRBC 0.0 0.0 - 0.2 %   Neutrophils Relative % 49 %   Neutro Abs 3.7 1.7 - 7.7 K/uL   Lymphocytes Relative 41 %   Lymphs Abs 3.2 0.7 - 4.0 K/uL   Monocytes Relative 9 %   Monocytes Absolute 0.7 0.1 - 1.0 K/uL   Eosinophils Relative 1 %   Eosinophils Absolute 0.1 0.0 - 0.5 K/uL   Basophils Relative 0 %   Basophils Absolute 0.0 0.0 - 0.1 K/uL   Immature Granulocytes 0 %   Abs Immature Granulocytes 0.02 0.00 - 0.07 K/uL    Comment: Performed at [facility], [address] [place], [place] [address]  Acetaminophen level     Status: Abnormal   Collection Time: 05/23/19  3:04 PM  Result Value Ref Range   Acetaminophen (Tylenol), Serum <10 (L) 10 - 30 ug/mL    Comment: (NOTE) Therapeutic concentrations vary significantly. A range of 10-30 ug/mL  may be an effective concentration for many patients.  However, some  are best treated at concentrations outside of this range. Acetaminophen concentrations >150 ug/mL at 4 hours after ingestion  and >50 ug/mL at 12 hours after ingestion are often associated with  toxic reactions. Performed at [facility], [address] [place], [place] [address]   Salicylate level     Status: Abnormal   Collection Time: 05/23/19  3:04 PM  Result Value Ref Range   Salicylate Lvl <7.0 (L) 7.0 - 30.0 mg/dL    Comment: Performed at [facility], [address] [place], [place] [address]  Valproic acid level     Status: None  Collection Time: 05/23/19  3:04 PM  Result Value Ref Range   Valproic Acid Lvl 79 50.0 - 100.0 ug/mL    Comment: Performed at [facility], [address], [place], [place] [address]    CT ABDOMEN PELVIS WO CONTRAST  Result Date: 05/23/2019 CLINICAL DATA:  28 year old male with history of anemia. Abnormal behavior in autistic patient. EXAM: CT ABDOMEN AND PELVIS WITHOUT CONTRAST TECHNIQUE: Multidetector CT imaging of the abdomen and pelvis was performed following the standard protocol without IV contrast. COMPARISON:  No priors. FINDINGS: Lower chest: Unremarkable. Hepatobiliary: No suspicious cystic or solid hepatic lesions are confidently identified on today's noncontrast CT examination. Unenhanced appearance of the gallbladder is normal. Pancreas: No definite pancreatic mass or peripancreatic fluid collections or inflammatory changes noted on today's noncontrast CT examination. Spleen: Unremarkable. Adrenals/Urinary Tract: Unenhanced appearance of the kidneys and bilateral adrenal glands is normal. No hydroureteronephrosis. Urinary bladder is normal in appearance. Stomach/Bowel: Normal appearance of the stomach. No pathologic dilatation of small bowel or colon. The appendix is dilated measuring up to 1.2 cm in diameter and there are surrounding inflammatory changes and a trace volume of periappendiceal fluid, compatible  with acute appendicitis. Appendix is retrocecal in position. Vascular/Lymphatic: No atherosclerotic calcifications in the abdominal or pelvic vasculature. No lymphadenopathy noted in the abdomen or pelvis. Reproductive: Prostate gland and seminal vesicles are unremarkable in appearance. Other: Trace volume of ascites in the low anatomic pelvis. No pneumoperitoneum. Musculoskeletal: Severe scoliosis of the thoracolumbar spine convex to the right in the thoracic region and to the left in the lumbar region. There are no aggressive appearing lytic or blastic lesions noted in the visualized portions of the skeleton. IMPRESSION: 1. Findings are compatible with an acute appendicitis with trace amount of periappendiceal fluid and trace volume of ascites, presumably reactive. No discrete periappendiceal abscess or signs of frank perforation or confidently identified at this time. Surgical consultation is strongly recommended. Critical Value/emergent results were called by telephone at the time of interpretation on 05/23/2019 at 5:37 pm to provider PA [place], who verbally acknowledged these results. Electronically Signed   By: Trudie Reed M.D.   On: 05/23/2019 17:40   CT Head Wo Contrast  Result Date: 05/23/2019 CLINICAL DATA:  Head trauma. Hit head against wall. History of autism. EXAM: CT HEAD WITHOUT CONTRAST TECHNIQUE: Contiguous axial images were obtained from the base of the skull through the vertex without intravenous contrast. COMPARISON:  05/14/2019 FINDINGS: Brain: There is no evidence of acute infarct, intracranial hemorrhage, mass, midline shift, or extra-axial fluid collection. The ventricles and sulci are normal. Vascular: No hyperdense vessel. Skull: No fracture or focal osseous lesion. Sinuses/Orbits: Trace right maxillary sinus mucosal thickening. Clear mastoid air cells. Unremarkable orbits. Other: Mild frontal scalp soft tissue thickening, also present previously. IMPRESSION: No evidence  of acute intracranial abnormality. Electronically Signed   By: Sebastian Ache M.D.   On: 05/23/2019 14:40    Review of Systems  Unable to perform ROS: Acuity of condition   Blood pressure 100/85, pulse (!) 101, temperature (!) 96.8 F (36 C), temperature source Temporal, resp. rate 16, SpO2 100 %. Physical Exam  Constitutional: He appears well-developed and well-nourished.  HENT:  Head: Normocephalic and atraumatic.  Eyes: EOM are normal.  Cardiovascular:  tachycardic  Respiratory: Effort normal.  GI:  Unable to exam due to agitation  Musculoskeletal:        General: Normal range of motion.     Cervical back: Normal range of motion and  neck supple.  Neurological: He is alert.  Nonverbal, pacing  Psychiatric:  agitated     Assessment/Plan: 28 yo male with agitation and workup showing dilated appendix -lap appendectomy -IV abx   De Blanch Jazelle Achey 05/23/2019, 7:35 PM

## 2019-05-23 NOTE — ED Notes (Signed)
No iv access he has been seen by the general surgeon not able to undress the pt after multiple drugs  And any iv placed is pulled out by the pt  Loud screaming and yelling

## 2019-05-23 NOTE — Op Note (Signed)
Preoperative diagnosis: acute appendicitis with peritonitis  Postoperative diagnosis: Same   Procedure: laparoscopic appendectomy  Surgeon: Gurney Maxin, M.D.  Asst: none  Anesthesia: Gen.   Indications for procedure: Paul Hendricks is a 28 y.o. male with symptoms of agitation and refusal to eat or take medications . Workup of CT scan showed acute appendicitis.   Description of procedure: The patient was brought into the operative suite, placed supine. Anesthesia was administered with endotracheal tube. The patient's left arm was tucked. All pressure points were offloaded by foam padding. The patient was prepped and draped in the usual sterile fashion.  A transverse incision was made to the left of the umbilicus and a 62mm trocar was [place]. Pneumoperitoneum was applied with high flow low pressure.  2 34mm trocars were placed, one in the suprapubic space, one in the LLQ, the periumbilical incision was then up-sized and a 79mm trocar placed in that space. A transversus abdominal block was placed on the left and right sides. Next, the patient was placed in trendelenberg, rotated to the left. The omentum was retracted cephalad. The cecum and appendix were identified. The appendix was retrocecal. The right colic gutter was inflamed. Blunt dissection was used to create a plan and an inflamed mass was found. Further dissection showed the base of the appendix that was mobilized from under the cecum. The base of the appendix was dissected and a window through the mesoappendix was created with blunt dissection. Large Hem-o-lock clips were used to doubly ligate the base of the appendix and mesoappendix. The appendix was cut free with scissors.  The appendix was placed in a specimen bag. The pelvis and RLQ were irrigated. The appendix was removed via the umbilicus. 0 vicryl was used to close the fascial defect. Pneumoperitoneum was removed, all trocars were removed. All incisions were closed with 4-0 monocryl  subcuticular stitch. The patient woke from anesthesia and was brought to PACU in stable condition.  Findings: suppurative appendicitis  Specimen: appendix  Blood loss: 30 ml  Local anesthesia: 20 ml marcaine  Complications: none  Gurney Maxin, M.D. General, Bariatric, & Minimally Invasive Surgery [facility]

## 2019-05-23 NOTE — ED Notes (Signed)
Patient transported to CT 

## 2019-05-23 NOTE — ED Notes (Signed)
Multiple attempts made to obtain IV; pt still very agitated, combative; EDP aware

## 2019-05-23 NOTE — Anesthesia Procedure Notes (Signed)
Procedure Name: Intubation Date/Time: 05/23/2019 8:38 PM Performed by: Shirlyn Goltz, CRNA Pre-anesthesia Checklist: Patient identified, Emergency Drugs available, Suction available and Patient being monitored Patient Re-evaluated:Patient Re-evaluated prior to induction Oxygen Delivery Method: Circle system utilized Preoxygenation: Pre-oxygenation with 100% oxygen Induction Type: IV induction and Rapid sequence Laryngoscope Size: Mac and 4 Grade View: Grade I Tube type: Oral Tube size: 7.0 mm Number of attempts: 1 Airway Equipment and Method: Stylet Placement Confirmation: ETT inserted through vocal cords under direct vision,  positive ETCO2 and breath sounds checked- equal and bilateral Secured at: 23 cm Tube secured with: Tape Dental Injury: Teeth and Oropharynx as per pre-operative assessment

## 2019-05-23 NOTE — ED Notes (Signed)
I was not able to perform EKG, Pt continued to push me away while trying. Informed Venetia Night, RN and Roslynn Amble, MD.

## 2019-05-24 DIAGNOSIS — K3532 Acute appendicitis with perforation and localized peritonitis, without abscess: Secondary | ICD-10-CM | POA: Diagnosis present

## 2019-05-24 DIAGNOSIS — F319 Bipolar disorder, unspecified: Secondary | ICD-10-CM | POA: Diagnosis present

## 2019-05-24 DIAGNOSIS — F72 Severe intellectual disabilities: Secondary | ICD-10-CM | POA: Diagnosis present

## 2019-05-24 DIAGNOSIS — G40909 Epilepsy, unspecified, not intractable, without status epilepticus: Secondary | ICD-10-CM | POA: Diagnosis present

## 2019-05-24 DIAGNOSIS — K353 Acute appendicitis with localized peritonitis, without perforation or gangrene: Secondary | ICD-10-CM | POA: Diagnosis present

## 2019-05-24 DIAGNOSIS — Z888 Allergy status to other drugs, medicaments and biological substances status: Secondary | ICD-10-CM | POA: Diagnosis not present

## 2019-05-24 DIAGNOSIS — Z20822 Contact with and (suspected) exposure to covid-19: Secondary | ICD-10-CM | POA: Diagnosis present

## 2019-05-24 DIAGNOSIS — F840 Autistic disorder: Secondary | ICD-10-CM | POA: Diagnosis present

## 2019-05-24 DIAGNOSIS — E781 Pure hyperglyceridemia: Secondary | ICD-10-CM | POA: Diagnosis present

## 2019-05-24 DIAGNOSIS — Z781 Physical restraint status: Secondary | ICD-10-CM | POA: Diagnosis not present

## 2019-05-24 DIAGNOSIS — R451 Restlessness and agitation: Secondary | ICD-10-CM | POA: Diagnosis present

## 2019-05-24 DIAGNOSIS — Z79899 Other long term (current) drug therapy: Secondary | ICD-10-CM | POA: Diagnosis not present

## 2019-05-24 LAB — COMPREHENSIVE METABOLIC PANEL
ALT: 35 U/L (ref 0–44)
AST: 52 U/L — ABNORMAL HIGH (ref 15–41)
Albumin: 3.2 g/dL — ABNORMAL LOW (ref 3.5–5.0)
Alkaline Phosphatase: 54 U/L (ref 38–126)
Anion gap: 10 (ref 5–15)
BUN: 6 mg/dL (ref 6–20)
CO2: 29 mmol/L (ref 22–32)
Calcium: 9.3 mg/dL (ref 8.9–10.3)
Chloride: 101 mmol/L (ref 98–111)
Creatinine, Ser: 1.11 mg/dL (ref 0.61–1.24)
GFR calc Af Amer: >60 mL/min (ref >60)
GFR calc non Af Amer: >60 mL/min (ref >60)
Glucose, Bld: 130 mg/dL — ABNORMAL HIGH (ref 70–99)
Potassium: 4.1 mmol/L (ref 3.5–5.1)
Sodium: 140 mmol/L (ref 135–145)
Total Bilirubin: 0.6 mg/dL (ref 0.3–1.2)
Total Protein: 7.6 g/dL (ref 6.5–8.1)

## 2019-05-24 LAB — CBC
HCT: 35.8 % — ABNORMAL LOW (ref 39.0–52.0)
Hemoglobin: 11.6 g/dL — ABNORMAL LOW (ref 13.0–17.0)
MCH: 30 pg (ref 26.0–34.0)
MCHC: 32.4 g/dL (ref 30.0–36.0)
MCV: 92.5 fL (ref 80.0–100.0)
Platelets: 328 10*3/uL (ref 150–400)
RBC: 3.87 MIL/uL — ABNORMAL LOW (ref 4.22–5.81)
RDW: 11.4 % — ABNORMAL LOW (ref 11.5–15.5)
WBC: 15.2 10*3/uL — ABNORMAL HIGH (ref 4.0–10.5)
nRBC: 0 % (ref 0.0–0.2)

## 2019-05-24 LAB — HIV ANTIBODY (ROUTINE TESTING W REFLEX): HIV Screen 4th Generation wRfx: NONREACTIVE

## 2019-05-24 MED ORDER — ARIPIPRAZOLE 5 MG PO TABS
10.0000 mg | ORAL_TABLET | Freq: Two times a day (BID) | ORAL | Status: DC
  Administered 2019-05-24 – 2019-05-25 (×4): 10 mg via ORAL
  Filled 2019-05-24 (×4): qty 2

## 2019-05-24 MED ORDER — LORAZEPAM 2 MG/ML IJ SOLN
INTRAMUSCULAR | Status: AC
  Filled 2019-05-24: qty 1

## 2019-05-24 MED ORDER — LORAZEPAM 2 MG/ML IJ SOLN
1.0000 mg | INTRAMUSCULAR | Status: AC
  Administered 2019-05-24: 08:00:00 1 mg via INTRAVENOUS

## 2019-05-24 MED ORDER — LORAZEPAM 2 MG/ML IJ SOLN
1.0000 mg | INTRAMUSCULAR | Status: DC | PRN
  Administered 2019-05-24: 12:00:00 1 mg via INTRAVENOUS
  Filled 2019-05-24: qty 1

## 2019-05-24 NOTE — Progress Notes (Signed)
1 Day Post-Op lap appy Subjective: Agitated overnight, restrained with sitter  Objective: Vital signs in last 24 hours: Temp:  [96.8 F (36 C)-99 F (37.2 C)] 99 F (37.2 C) (12/31 0829) Pulse Rate:  [77-101] 97 (12/31 0829) Resp:  [14-24] 18 (12/31 0829) BP: (100-152)/(68-89) 124/89 (12/31 0829) SpO2:  [94 %-100 %] 94 % (12/31 0829)   Intake/Output from previous day: 12/30 0701 - 12/31 0700 In: 916.1 [I.V.:816.1; IV Piggyback:100] Out: 30 [Blood:30] Intake/Output this shift: Total I/O In: 240 [P.O.:240] Out: -    General appearance: alert and cooperative GI: normal findings: soft, non-tender  Incision: no significant drainage  Lab Results:  Recent Labs    05/23/19 1504  WBC 7.7  HGB 11.8*  HCT 37.6*  PLT 295   BMET Recent Labs    05/23/19 1504  NA 141  K 4.2  CL 103  CO2 30  GLUCOSE 84  BUN 8  CREATININE 1.04  CALCIUM 8.9   PT/INR No results for input(s): LABPROT, INR in the last 72 hours. ABG No results for input(s): PHART, HCO3 in the last 72 hours.  Invalid input(s): PCO2, PO2  MEDS, Scheduled . atorvastatin  20 mg Oral Daily  . clonazePAM  0.5 mg Oral 2 times per day  . clonazePAM  1 mg Oral QHS  . divalproex  500 mg Oral Daily   And  . divalproex  1,500 mg Oral QHS  . LORazepam      . LORazepam  1 mg Intravenous 30 min Pre-Op  . Melatonin  3 mg Oral BID  . midazolam  4 mg Intramuscular Once  . naltrexone  25 mg Oral BID  . traZODone  100 mg Oral BID  . ziprasidone  60 mg Oral BID WC    Studies/Results: CT ABDOMEN PELVIS WO CONTRAST  Result Date: 05/23/2019 CLINICAL DATA:  28 year old male with history of anemia. Abnormal behavior in autistic patient. EXAM: CT ABDOMEN AND PELVIS WITHOUT CONTRAST TECHNIQUE: Multidetector CT imaging of the abdomen and pelvis was performed following the standard protocol without IV contrast. COMPARISON:  No priors. FINDINGS: Lower chest: Unremarkable. Hepatobiliary: No suspicious cystic or solid  hepatic lesions are confidently identified on today's noncontrast CT examination. Unenhanced appearance of the gallbladder is normal. Pancreas: No definite pancreatic mass or peripancreatic fluid collections or inflammatory changes noted on today's noncontrast CT examination. Spleen: Unremarkable. Adrenals/Urinary Tract: Unenhanced appearance of the kidneys and bilateral adrenal glands is normal. No hydroureteronephrosis. Urinary bladder is normal in appearance. Stomach/Bowel: Normal appearance of the stomach. No pathologic dilatation of small bowel or colon. The appendix is dilated measuring up to 1.2 cm in diameter and there are surrounding inflammatory changes and a trace volume of periappendiceal fluid, compatible with acute appendicitis. Appendix is retrocecal in position. Vascular/Lymphatic: No atherosclerotic calcifications in the abdominal or pelvic vasculature. No lymphadenopathy noted in the abdomen or pelvis. Reproductive: Prostate gland and seminal vesicles are unremarkable in appearance. Other: Trace volume of ascites in the low anatomic pelvis. No pneumoperitoneum. Musculoskeletal: Severe scoliosis of the thoracolumbar spine convex to the right in the thoracic region and to the left in the lumbar region. There are no aggressive appearing lytic or blastic lesions noted in the visualized portions of the skeleton. IMPRESSION: 1. Findings are compatible with an acute appendicitis with trace amount of periappendiceal fluid and trace volume of ascites, presumably reactive. No discrete periappendiceal abscess or signs of frank perforation or confidently identified at this time. Surgical consultation is strongly recommended. Critical Value/emergent results were  called by telephone at the time of interpretation on 05/23/2019 at 5:37 pm to provider PA Martinique Robinson, who verbally acknowledged these results. Electronically Signed   By: Vinnie Langton M.D.   On: 05/23/2019 17:40   CT Head Wo  Contrast  Result Date: 05/23/2019 CLINICAL DATA:  Head trauma. Hit head against wall. History of autism. EXAM: CT HEAD WITHOUT CONTRAST TECHNIQUE: Contiguous axial images were obtained from the base of the skull through the vertex without intravenous contrast. COMPARISON:  05/14/2019 FINDINGS: Brain: There is no evidence of acute infarct, intracranial hemorrhage, mass, midline shift, or extra-axial fluid collection. The ventricles and sulci are normal. Vascular: No hyperdense vessel. Skull: No fracture or focal osseous lesion. Sinuses/Orbits: Trace right maxillary sinus mucosal thickening. Clear mastoid air cells. Unremarkable orbits. Other: Mild frontal scalp soft tissue thickening, also present previously. IMPRESSION: No evidence of acute intracranial abnormality. Electronically Signed   By: Logan Bores M.D.   On: 05/23/2019 14:40   DG Chest Portable 1 View  Result Date: 05/23/2019 CLINICAL DATA:  Medical screening. Autistic. Not taking medications. Appendectomy earlier today. EXAM: PORTABLE CHEST 1 VIEW COMPARISON:  Radiograph 09/17/2005 FINDINGS: The cardiomediastinal contours are normal. The lungs are clear. Pulmonary vasculature is normal. No consolidation, pleural effusion, or pneumothorax. Scoliotic curvature of the spine. No acute osseous abnormalities are seen. Free air under the right hemidiaphragm, likely related to appendectomy earlier this day. IMPRESSION: 1. No acute chest finding. 2. Free air under the right hemidiaphragm, likely related to appendectomy earlier this day. 3. Scoliosis. Electronically Signed   By: Keith Rake M.D.   On: 05/23/2019 23:11    Assessment: s/p Procedure(s): APPENDECTOMY LAPAROSCOPIC Patient Active Problem List   Diagnosis Date Noted  . Acute appendicitis 05/23/2019  . Acute constipation 10/10/2018  . Atopic dermatitis 10/10/2018  . Hypertriglyceridemia 01/12/2017  . 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 Severe intellectual  disability 04/22/2015  . Autistic disorder 04/22/2015  . Bipolar disorder ([place]) 03/05/2015  . Impacted teeth with abnormal position 12/23/2014    Expected post op course  Plan: Advance diet  Will need pysch eval for placement   LOS: 0 days     .Rosario Adie, MD [facility], [place]    05/24/2019 10:53 AM

## 2019-05-24 NOTE — Progress Notes (Signed)
At the beginning of shift patient was very agitated. Got out of his  room and was wandering in the hallways. Attempt to redirect  patient was unsuccessful. Patient was biting his own hand, screaming and yelling. Patient unable to verbalize needs. Out going nurse received an order for ativan, security was called and we were able to administer dose.  4 point Restrains donned, and patient eventually calmed down. On call provider was notified and received order for restrain, safety sitter and PRN ativan. Sitter at bedside at this time. Patient took his morning meds and ate 100% of breakfast. No signs of pain noted. Will cont to monitor.

## 2019-05-24 NOTE — Consult Note (Addendum)
Telepsych Consultation   Reason for Consult:  "Needs eval for placement" Referring Physician:  [facility] Location of Patient: [facility] Location of Provider: [facility]  Patient Identification: Paul Hendricks MRN:  [id number] Principal Diagnosis: Acute appendicitis Diagnosis:  Principal Problem:   Acute appendicitis   Total Time spent with patient: 30 minutes  Subjective:   Paul Hendricks is a 28 y.o. male patient admitted with acute appendicitis.  Patient assessed by nurse practitioner.  Attempted to assess patient using telemedicine monitor, patient unresponsive to to telemedicine monitor.  Per RN patient is nonverbal, per RN no meaningful conversation during this shift.  Per RN patient currently restrained related to acting out and yelling behaviors. Patient appears unable to participate in assessment at this time.  Patient with history of autistic disorder, bipolar disorder, and severe intellectual disability. Case discussed with Dr. Jola Babinski, patient cleared by psychiatry.  HPI: Patient admitted with agitation and refusing food and medications, found to have acute appendicitis.  Past Psychiatric History: Autism, bipolar disorder  Risk to Self: Suicidal Ideation: No Suicidal Intent: No Is patient at risk for suicide?: No Suicidal Plan?: No Access to Means: No What has been your use of drugs/alcohol within the last 12 months?: none How many times?: 0 Other Self Harm Risks: self-damaging behavior Triggers for Past Attempts: None known Intentional Self Injurious Behavior: Damaging(head banging and biting) Risk to Others: Homicidal Ideation: No Thoughts of Harm to Others: No Current Homicidal Intent: No Current Homicidal Plan: No Access to Homicidal Means: No Identified Victim: none History of harm to others?: No Assessment of Violence: None Noted Violent Behavior Description: none Does patient have access to weapons?: No Criminal  Charges Pending?: No Does patient have a court date: No Prior Inpatient Therapy: Prior Inpatient Therapy: No Prior Outpatient Therapy: Prior Outpatient Therapy: Yes Prior Therapy Dates: active Prior Therapy Facilty/Provider(s): Dr Jannifer Franklin Reason for Treatment: Autism and Behavior Control Does patient have an ACCT team?: No Does patient have Intensive In-House Services?  : No Does patient have [facility] services? : No Does patient have P4CC services?: No  Past Medical History:  Past Medical History:  Diagnosis Date   Anxiety    Autism disorder    WITH BEHAVIORAL ISSUES   Seizures (HCC)    LAST SEIZURE 5-6 YRS AGO    Past Surgical History:  Procedure Laterality Date   LAPAROSCOPIC APPENDECTOMY N/A 05/23/2019   Procedure: APPENDECTOMY LAPAROSCOPIC;  Surgeon: Kinsinger, De Blanch, MD;  Location: [facility] OR;  Service: General;  Laterality: N/A;   TOOTH EXTRACTION N/A 12/23/2014   Procedure: EXTRACTION  OF TEETH 1,19,14,78;  Surgeon: Lincoln Brigham, DDS;  Location: [facility] OR;  Service: Oral Surgery;  Laterality: N/A;   Family History: No family history on file. Family Psychiatric  History: Unknown Social History:  Social History   Substance and Sexual Activity  Alcohol Use No   Alcohol/week: 0.0 standard drinks     Social History   Substance and Sexual Activity  Drug Use No    Social History   Socioeconomic History   Marital status: Single    Spouse name: Not on file   Number of children: Not on file   Years of education: Not on file   Highest education level: Not on file  Occupational History   Not on file  Tobacco Use   Smoking status: Never Smoker   Smokeless tobacco: Never Used  Substance and Sexual Activity   Alcohol use: No    Alcohol/week: 0.0 standard drinks  Drug use: No   Sexual activity: Not on file  Other Topics Concern   Not on file  Social History Narrative   Not on file   Social Determinants of Health   Financial Resource Strain:    Difficulty of  Paying Living Expenses: Not on file  Food Insecurity:    Worried About Running Out of Food in the Last Year: Not on file   Ran Out of Food in the Last Year: Not on file  Transportation Needs:    Lack of Transportation (Medical): Not on file   Lack of Transportation (Non-Medical): Not on file  Physical Activity:    Days of Exercise per Week: Not on file   Minutes of Exercise per Session: Not on file  Stress:    Feeling of Stress : Not on file  Social Connections:    Frequency of Communication with Friends and Family: Not on file   Frequency of Social Gatherings with Friends and Family: Not on file   Attends Religious Services: Not on file   Active Member of Clubs or Organizations: Not on file   Attends Banker Meetings: Not on file   Marital Status: Not on file   Additional Social History:    Allergies:   Allergies  Allergen Reactions   Atorvastatin Rash    Labs:  Results for orders placed or performed during the hospital encounter of 05/23/19 (from the past 48 hour(s))  Respiratory Panel by RT PCR (Flu A&B, Covid) - Nasopharyngeal Swab     Status: None   Collection Time: 05/23/19  2:40 PM   Specimen: Nasopharyngeal Swab  Result Value Ref Range   SARS Coronavirus 2 by RT PCR NEGATIVE NEGATIVE    Comment: (NOTE) SARS-CoV-2 target nucleic acids are NOT DETECTED. The SARS-CoV-2 RNA is generally detectable in upper respiratoy specimens during the acute phase of infection. The lowest concentration of SARS-CoV-2 viral copies this assay can detect is 131 copies/mL. A negative result does not preclude SARS-Cov-2 infection and should not be used as the sole basis for treatment or other patient management decisions. A negative result may occur with  improper specimen collection/handling, submission of specimen other than nasopharyngeal swab, presence of viral mutation(s) within the areas targeted by this assay, and inadequate number of viral copies (<131 copies/mL). A  negative result must be combined with clinical observations, patient history, and epidemiological information. The expected result is Negative. Fact Sheet for Patients:  https://www.moore.com/ Fact Sheet for Healthcare Providers:  https://www.young.biz/ This test is not yet ap proved or cleared by the [place] FDA and  has been authorized for detection and/or diagnosis of SARS-CoV-2 by FDA under an Emergency Use Authorization (EUA). This EUA will remain  in effect (meaning this test can be used) for the duration of the COVID-19 declaration under Section 564(b)(1) of the Act, 21 U.S.C. section 360bbb-3(b)(1), unless the authorization is terminated or revoked sooner.    Influenza A by PCR NEGATIVE NEGATIVE   Influenza B by PCR NEGATIVE NEGATIVE    Comment: (NOTE) The Xpert Xpress SARS-CoV-2/FLU/RSV assay is intended as an aid in  the diagnosis of influenza from Nasopharyngeal swab specimens and  should not be used as a sole basis for treatment. Nasal washings and  aspirates are unacceptable for Xpert Xpress SARS-CoV-2/FLU/RSV  testing. Fact Sheet for Patients: https://www.moore.com/ Fact Sheet for Healthcare Providers: https://www.young.biz/ This test is not yet approved or cleared by the [place] FDA and  has been authorized for detection and/or diagnosis of  SARS-CoV-2 by  FDA under an Emergency Use Authorization (EUA). This EUA will remain  in effect (meaning this test can be used) for the duration of the  Covid-19 declaration under Section 564(b)(1) of the Act, 21  U.S.C. section 360bbb-3(b)(1), unless the authorization is  terminated or revoked. Performed at [facility], [address] [place], [place] [address]   Comprehensive metabolic panel     Status: Abnormal   Collection Time: 05/23/19  3:04 PM  Result Value Ref Range   Sodium 141 135 - 145 mmol/L   Potassium 4.2 3.5 - 5.1  mmol/L   Chloride 103 98 - 111 mmol/L   CO2 30 22 - 32 mmol/L   Glucose, Bld 84 70 - 99 mg/dL   BUN 8 6 - 20 mg/dL   Creatinine, Ser 1.301.04 0.61 - 1.24 mg/dL   Calcium 8.9 8.9 - 86.510.3 mg/dL   Total Protein 7.0 6.5 - 8.1 g/dL   Albumin 3.1 (L) 3.5 - 5.0 g/dL   AST 45 (H) 15 - 41 U/L   ALT 32 0 - 44 U/L   Alkaline Phosphatase 52 38 - 126 U/L   Total Bilirubin 0.4 0.3 - 1.2 mg/dL   GFR calc non Af Amer >60 >60 mL/min   GFR calc Af Amer >60 >60 mL/min   Anion gap 8 5 - 15    Comment: Performed at [facility], [address] [place], [place] [address]  CBC with Diff     Status: Abnormal   Collection Time: 05/23/19  3:04 PM  Result Value Ref Range   WBC 7.7 4.0 - 10.5 K/uL   RBC 3.94 (L) 4.22 - 5.81 MIL/uL   Hemoglobin 11.8 (L) 13.0 - 17.0 g/dL   HCT 62.937.6 (L) 52.839.0 - 41.352.0 %   MCV 95.4 80.0 - 100.0 fL   MCH 29.9 26.0 - 34.0 pg   MCHC 31.4 30.0 - 36.0 g/dL   RDW 24.411.4 (L) 01.011.5 - 27.215.5 %   Platelets 295 150 - 400 K/uL   nRBC 0.0 0.0 - 0.2 %   Neutrophils Relative % 49 %   Neutro Abs 3.7 1.7 - 7.7 K/uL   Lymphocytes Relative 41 %   Lymphs Abs 3.2 0.7 - 4.0 K/uL   Monocytes Relative 9 %   Monocytes Absolute 0.7 0.1 - 1.0 K/uL   Eosinophils Relative 1 %   Eosinophils Absolute 0.1 0.0 - 0.5 K/uL   Basophils Relative 0 %   Basophils Absolute 0.0 0.0 - 0.1 K/uL   Immature Granulocytes 0 %   Abs Immature Granulocytes 0.02 0.00 - 0.07 K/uL    Comment: Performed at [facility], [address] [place], [place] [address]  Acetaminophen level     Status: Abnormal   Collection Time: 05/23/19  3:04 PM  Result Value Ref Range   Acetaminophen (Tylenol), Serum <10 (L) 10 - 30 ug/mL    Comment: (NOTE) Therapeutic concentrations vary significantly. A range of 10-30 ug/mL  may be an effective concentration for many patients. However, some  are best treated at concentrations outside of this range. Acetaminophen concentrations >150 ug/mL at 4 hours after ingestion  and >50 ug/mL at 12  hours after ingestion are often associated with  toxic reactions. Performed at [facility], [address] [place], [place] [address]   Salicylate level     Status: Abnormal   Collection Time: 05/23/19  3:04 PM  Result Value Ref Range   Salicylate  Lvl <7.0 (L) 7.0 - 30.0 mg/dL    Comment: Performed at [facility], [address], [place], [place] [address]  Valproic acid level     Status: None   Collection Time: 05/23/19  3:04 PM  Result Value Ref Range   Valproic Acid Lvl 79 50.0 - 100.0 ug/mL    Comment: Performed at [facility], [address] [place], [place] [address]  Comprehensive metabolic panel     Status: Abnormal   Collection Time: 05/24/19 11:30 AM  Result Value Ref Range   Sodium 140 135 - 145 mmol/L   Potassium 4.1 3.5 - 5.1 mmol/L   Chloride 101 98 - 111 mmol/L   CO2 29 22 - 32 mmol/L   Glucose, Bld 130 (H) 70 - 99 mg/dL   BUN 6 6 - 20 mg/dL   Creatinine, Ser 6.28 0.61 - 1.24 mg/dL   Calcium 9.3 8.9 - 31.5 mg/dL   Total Protein 7.6 6.5 - 8.1 g/dL   Albumin 3.2 (L) 3.5 - 5.0 g/dL   AST 52 (H) 15 - 41 U/L   ALT 35 0 - 44 U/L   Alkaline Phosphatase 54 38 - 126 U/L   Total Bilirubin 0.6 0.3 - 1.2 mg/dL   GFR calc non Af Amer >60 >60 mL/min   GFR calc Af Amer >60 >60 mL/min   Anion gap 10 5 - 15    Comment: Performed at [facility], [address] [place], [place] [address]  CBC     Status: Abnormal   Collection Time: 05/24/19 11:30 AM  Result Value Ref Range   WBC 15.2 (H) 4.0 - 10.5 K/uL   RBC 3.87 (L) 4.22 - 5.81 MIL/uL   Hemoglobin 11.6 (L) 13.0 - 17.0 g/dL   HCT 07.3 (L) 71.0 - 62.6 %   MCV 92.5 80.0 - 100.0 fL   MCH 30.0 26.0 - 34.0 pg   MCHC 32.4 30.0 - 36.0 g/dL   RDW 94.8 (L) 54.6 - 27.0 %   Platelets 328 150 - 400 K/uL   nRBC 0.0 0.0 - 0.2 %    Comment: Performed at [facility], [address] [place], [place] [address]  HIV Antibody (routine testing w rflx)     Status: None   Collection Time: 05/24/19 11:30  AM  Result Value Ref Range   HIV Screen 4th Generation wRfx NON REACTIVE NON REACTIVE    Comment: Performed at [facility], [address], [place], [place] [address]    Medications:  Current Facility-Administered Medications  Medication Dose Route Frequency Provider Last Rate Last Admin   acetaminophen (TYLENOL) tablet 650 mg  650 mg Oral Q6H PRN Kinsinger, De Blanch, MD       Or   acetaminophen (TYLENOL) suppository 650 mg  650 mg Rectal Q6H PRN Kinsinger, De Blanch, MD       atorvastatin (LIPITOR) tablet 20 mg  20 mg Oral Daily Kinsinger, De Blanch, MD   20 mg at 05/24/19 9937   clonazePAM (KLONOPIN) tablet 0.5 mg  0.5 mg Oral 2 times per day Kinsinger, De Blanch, MD   0.5 mg at 05/24/19 1696   clonazePAM (KLONOPIN) tablet 1 mg  1 mg Oral QHS Kinsinger, De Blanch, MD   1 mg at 05/23/19 2358   dextrose 5 % and 0.45 % NaCl with KCl 20 mEq/L infusion   Intravenous Continuous Kinsinger, De Blanch, MD 75 mL/hr at 05/24/19 7893 New Bag  at 05/24/19 0835   divalproex (DEPAKOTE ER) 24 hr tablet 500 mg  500 mg Oral Daily Kinsinger, Arta Bruce, MD   500 mg at 05/24/19 2355   And   divalproex (DEPAKOTE ER) 24 hr tablet 1,500 mg  1,500 mg Oral QHS Kinsinger, Arta Bruce, MD   1,500 mg at 05/24/19 0056   hydrOXYzine (ATARAX/VISTARIL) tablet 50 mg  50 mg Oral TID PRN Kinsinger, Arta Bruce, MD       LORazepam (ATIVAN) 2 MG/ML injection            LORazepam (ATIVAN) injection 1 mg  1 mg Intravenous 30 min Pre-Op Kinsinger, Arta Bruce, MD   1 mg at 05/24/19 0746   LORazepam (ATIVAN) injection 1 mg  1 mg Intravenous Q1H PRN Earnstine Regal, PA-C   1 mg at 05/24/19 1148   Melatonin TABS 3 mg  3 mg Oral BID Kinsinger, Arta Bruce, MD   3 mg at 05/24/19 0837   metoprolol tartrate (LOPRESSOR) injection 5 mg  5 mg Intravenous Q6H PRN Kinsinger, Arta Bruce, MD       midazolam (VERSED) injection 4 mg  4 mg Intramuscular Once Robinson, [place] N, PA-C   Stopped at 05/23/19 7322   morphine 2 MG/ML  injection 2 mg  2 mg Intravenous Q2H PRN Kinsinger, Arta Bruce, MD   2 mg at 05/24/19 0448   naltrexone (DEPADE) tablet 25 mg  25 mg Oral BID Kinsinger, Arta Bruce, MD   25 mg at 05/24/19 0101   ondansetron (ZOFRAN-ODT) disintegrating tablet 4 mg  4 mg Oral Q6H PRN Kinsinger, Arta Bruce, MD       Or   ondansetron [facility]) injection 4 mg  4 mg Intravenous Q6H PRN Kinsinger, Arta Bruce, MD       oxyCODONE (Oxy IR/ROXICODONE) immediate release tablet 5 mg  5 mg Oral Q6H PRN Kinsinger, Arta Bruce, MD       polyethylene glycol (MIRALAX / GLYCOLAX) packet 17 g  17 g Oral Daily PRN Kinsinger, Arta Bruce, MD       traZODone (DESYREL) tablet 100 mg  100 mg Oral BID Kinsinger, Arta Bruce, MD   100 mg at 05/24/19 0254    Musculoskeletal: Strength & Muscle Tone:  Unable to assess Gait & Station:  Unable to assess Patient leans:  Unable to assess  Psychiatric Specialty Exam: Physical Exam  Constitutional: He appears well-developed.  Cardiovascular: Normal rate.  Respiratory: Effort normal.    Review of Systems  Psychiatric/Behavioral: Positive for behavioral problems.    Blood pressure 124/89, pulse 97, temperature 99 F (37.2 C), temperature source Oral, resp. rate 18, SpO2 94 %.There is no height or weight on file to calculate BMI.  General Appearance: Casual  Eye Contact:  None  Speech:   Patient nonverbal  Volume:   Patient nonverbal  Mood:   Patient appears sedated  Affect:  Blunt  Thought Process:  NA  Orientation:  Other:  Unable to assess  Thought Content:   Unable to assess  Suicidal Thoughts:   Unable to assess  Homicidal Thoughts:   Unable to assess  Memory:   Unable to assess  Judgement:  Other:  Unable to assess  Insight:   Unable to assess  Psychomotor Activity:   Unable to assess  Concentration:  Concentration: Unable to assess  Recall:   Unable to assess  Fund of Knowledge:   Unable to assess  Language:   Unable to assess  Akathisia:   Unable to assess  Handed:  Unknown  AIMS (if indicated):     Assets:  Health and safety inspector Housing Leisure Time Physical Health Social Support  ADL's:  Impaired  Cognition:  Impaired,  Severe  Sleep:        Treatment Plan Summary: Recommend consider restart home medications.   Consider discontinue naltrexone. Consider social work consult to assist with placement/discharge  Disposition: No evidence of imminent risk to self or others at present.   Patient does not meet criteria for psychiatric inpatient admission.  This service was provided via telemedicine using a 2-way, interactive audio and video technology.  Names of all persons participating in this telemedicine service and their role in this encounter. Name: Berneice Heinrich  Role: FNP  Name:Paul Hendricks Role: Patient    Patrcia Dolly, FNP 05/24/2019 1:55 PM   Case discussed and plan agreed upon as outlined above by Ms. Arlana Pouch.  Clearly the naltrexone at this point would do nothing but block the activity of his pain meds which she needs status post appendectomy.  Hopefully with his pain under control his agitation will decrease.  If the naltrexone is secondary to some unspecified need with regard to his autism this can be restarted once he has completed his recovery and the need of pain medications.

## 2019-05-24 NOTE — Care Management (Signed)
CM consult to assist with group home return acknowledged; VM left with [name] requesting a returned call.   Midge Minium MSN, RN, NCM-BC, ACM-RN [phone]

## 2019-05-24 NOTE — Significant Event (Signed)
Rapid Response Event Note  Overview: Time Called: 0745 Arrival Time: 0748 Event Type: Other (Comment)  Initial Focused Assessment: Patient is restless and walking the halls,  Security is on department.  Encouraged back to his room.  In bed, restraints placed  Interventions: Ativan given IV RN to give am PO meds.   Plan of Care (if not transferred): RN to call if assistance needed.   Event Summary: Name of Physician Notified: Dr Kieth Brightly at [phone]    at    Outcome: Stayed in room and stabalized  Event End Time: 0820  Raliegh Ip

## 2019-05-24 NOTE — Anesthesia Postprocedure Evaluation (Signed)
Anesthesia Post Note  Patient: Paul Hendricks  Procedure(s) Performed: APPENDECTOMY LAPAROSCOPIC (N/A )     Patient location during evaluation: PACU Anesthesia Type: General Level of consciousness: awake and patient cooperative Pain management: pain level controlled Vital Signs Assessment: post-procedure vital signs reviewed and stable Respiratory status: spontaneous breathing, nonlabored ventilation, respiratory function stable and patient connected to nasal cannula oxygen Cardiovascular status: blood pressure returned to baseline and stable Postop Assessment: no apparent nausea or vomiting Anesthetic complications: no    Last Vitals:  Vitals:   05/23/19 2329 05/24/19 0320  BP: 125/87 125/84  Pulse: 84 82  Resp: 16 16  Temp: 36.9 C 36.7 C  SpO2: 100% 95%    Last Pain:  Vitals:   05/24/19 0445  TempSrc:   PainSc: 6                  Alaylah Heatherington

## 2019-05-25 NOTE — TOC Transition Note (Signed)
Transition of Care [facility]) - CM/SW Discharge Note   Patient Details  Name: Paul Hendricks MRN: [id number] Date of Birth: June 21, 1990  [facility] CM/SW Contact:  Colleen Can MSN, RN, NCM-BC, ACM-RN [phone] Phone Number: 05/25/2019, 12:50 PM   Clinical Narrative:    CM following for transitional needs. CM spoke to the patients legal guardian, Paul Hendricks to discuss the POC, with the guardian deferring CM speak to the patients caregiver at [facility] to discuss the admission process. CM spoke to [facility]; per Okey Dupre, no FL2 is required for the patients readmission, but a request for the bedside nurse to contact her at [phone] to discuss the patients hospital course with the nurse at the facility, with the facility able to provide transportation once he has been approved to return. CM updated the bedside nurse Winferd Humphrey. No further needs from CM.   Final next level of care: Group Home Barriers to Discharge: No Barriers Identified   Patient Goals and CMS Choice Patient states their goals for this hospitalization and ongoing recovery are:: unable to speak   Choice offered to / list presented to : NA    Discharge Plan and Services                DME Arranged: N/A DME Agency: NA       HH Arranged: NA HH Agency: NA        Social Determinants of Health (SDOH) Interventions     Readmission Risk Interventions No flowsheet data found.

## 2019-05-25 NOTE — Progress Notes (Addendum)
Case manager requested that I speak with [name], from the Group home that the patient resides at [phone]).    [name] asked me about specific concerns she had with taking the patient back.  She asked about his surgical site r/t the closure type and if he had been picking at it.  I told her it was closed with skin glue and that it hadn't been noted he had been picking at it.  She was concerned with medication compliance, because prior to admission he was refusing his medications.  I told her we had been crushing his meds in chocolate ice cream and that he had been compliant with taking medications.  She was concerned with his nutrition status, because prior to admission he hadn't been eating.  We discussed that he seems to only eat sweet, soft foods.  Also, made recommendations of trying chocolate milk and pudding.  [name] stated she would not be able to accept the patient back until possibly 05/25/2018, or if not on 01/02, then definitely she could take him back on Monday, 05/27/18.  She stated that she needed to make sure they had the things necessary to care for the patient, specifically, staffing.  She stated she assigned two staff members to the patient to walk with him at all times.

## 2019-05-25 NOTE — Progress Notes (Signed)
2 Days Post-Op lap appy Subjective: No acute events  Objective: Vital signs in last 24 hours: Temp:  [98.2 F (36.8 C)-99.4 F (37.4 C)] 98.5 F (36.9 C) (01/01 0644) Pulse Rate:  [85-103] 103 (01/01 0644) Resp:  [16-18] 18 (01/01 0644) BP: (125-135)/(86-104) 135/104 (01/01 0644) SpO2:  [97 %-100 %] 97 % (01/01 0644)   Intake/Output from previous day: 12/31 0701 - 01/01 0700 In: 720 [P.O.:720] Out: 450 [Urine:450] Intake/Output this shift: No intake/output data recorded.   General appearance: alert and cooperative GI: normal findings: soft, non-tender  Incisions c/d/i  Lab Results:  Recent Labs    05/23/19 1504 05/24/19 1130  WBC 7.7 15.2*  HGB 11.8* 11.6*  HCT 37.6* 35.8*  PLT 295 328   BMET Recent Labs    05/23/19 1504 05/24/19 1130  NA 141 140  K 4.2 4.1  CL 103 101  CO2 30 29  GLUCOSE 84 130*  BUN 8 6  CREATININE 1.04 1.11  CALCIUM 8.9 9.3   PT/INR No results for input(s): LABPROT, INR in the last 72 hours. ABG No results for input(s): PHART, HCO3 in the last 72 hours.  Invalid input(s): PCO2, PO2  MEDS, Scheduled . ARIPiprazole  10 mg Oral BID  . atorvastatin  20 mg Oral Daily  . clonazePAM  0.5 mg Oral 2 times per day  . clonazePAM  1 mg Oral QHS  . divalproex  500 mg Oral Daily   And  . divalproex  1,500 mg Oral QHS  . Melatonin  3 mg Oral BID  . midazolam  4 mg Intramuscular Once  . traZODone  100 mg Oral BID    Studies/Results: CT ABDOMEN PELVIS WO CONTRAST  Result Date: 05/23/2019 CLINICAL DATA:  29 year old male with history of anemia. Abnormal behavior in autistic patient. EXAM: CT ABDOMEN AND PELVIS WITHOUT CONTRAST TECHNIQUE: Multidetector CT imaging of the abdomen and pelvis was performed following the standard protocol without IV contrast. COMPARISON:  No priors. FINDINGS: Lower chest: Unremarkable. Hepatobiliary: No suspicious cystic or solid hepatic lesions are confidently identified on today's noncontrast CT examination.  Unenhanced appearance of the gallbladder is normal. Pancreas: No definite pancreatic mass or peripancreatic fluid collections or inflammatory changes noted on today's noncontrast CT examination. Spleen: Unremarkable. Adrenals/Urinary Tract: Unenhanced appearance of the kidneys and bilateral adrenal glands is normal. No hydroureteronephrosis. Urinary bladder is normal in appearance. Stomach/Bowel: Normal appearance of the stomach. No pathologic dilatation of small bowel or colon. The appendix is dilated measuring up to 1.2 cm in diameter and there are surrounding inflammatory changes and a trace volume of periappendiceal fluid, compatible with acute appendicitis. Appendix is retrocecal in position. Vascular/Lymphatic: No atherosclerotic calcifications in the abdominal or pelvic vasculature. No lymphadenopathy noted in the abdomen or pelvis. Reproductive: Prostate gland and seminal vesicles are unremarkable in appearance. Other: Trace volume of ascites in the low anatomic pelvis. No pneumoperitoneum. Musculoskeletal: Severe scoliosis of the thoracolumbar spine convex to the right in the thoracic region and to the left in the lumbar region. There are no aggressive appearing lytic or blastic lesions noted in the visualized portions of the skeleton. IMPRESSION: 1. Findings are compatible with an acute appendicitis with trace amount of periappendiceal fluid and trace volume of ascites, presumably reactive. No discrete periappendiceal abscess or signs of frank perforation or confidently identified at this time. Surgical consultation is strongly recommended. Critical Value/emergent results were called by telephone at the time of interpretation on 05/23/2019 at 5:37 pm to provider PA Martinique Robinson, who verbally  acknowledged these results. Electronically Signed   By: Trudie Reed M.D.   On: 05/23/2019 17:40   CT Head Wo Contrast  Result Date: 05/23/2019 CLINICAL DATA:  Head trauma. Hit head against wall. History of  autism. EXAM: CT HEAD WITHOUT CONTRAST TECHNIQUE: Contiguous axial images were obtained from the base of the skull through the vertex without intravenous contrast. COMPARISON:  05/14/2019 FINDINGS: Brain: There is no evidence of acute infarct, intracranial hemorrhage, mass, midline shift, or extra-axial fluid collection. The ventricles and sulci are normal. Vascular: No hyperdense vessel. Skull: No fracture or focal osseous lesion. Sinuses/Orbits: Trace right maxillary sinus mucosal thickening. Clear mastoid air cells. Unremarkable orbits. Other: Mild frontal scalp soft tissue thickening, also present previously. IMPRESSION: No evidence of acute intracranial abnormality. Electronically Signed   By: Sebastian Ache M.D.   On: 05/23/2019 14:40   DG Chest Portable 1 View  Result Date: 05/23/2019 CLINICAL DATA:  Medical screening. Autistic. Not taking medications. Appendectomy earlier today. EXAM: PORTABLE CHEST 1 VIEW COMPARISON:  Radiograph 09/17/2005 FINDINGS: The cardiomediastinal contours are normal. The lungs are clear. Pulmonary vasculature is normal. No consolidation, pleural effusion, or pneumothorax. Scoliotic curvature of the spine. No acute osseous abnormalities are seen. Free air under the right hemidiaphragm, likely related to appendectomy earlier this day. IMPRESSION: 1. No acute chest finding. 2. Free air under the right hemidiaphragm, likely related to appendectomy earlier this day. 3. Scoliosis. Electronically Signed   By: Narda Rutherford M.D.   On: 05/23/2019 23:11    Assessment: s/p Procedure(s): APPENDECTOMY LAPAROSCOPIC Patient Active Problem List   Diagnosis Date Noted  . Acute appendicitis 05/23/2019  . Acute constipation 10/10/2018  . Atopic dermatitis 10/10/2018  . Hypertriglyceridemia 01/12/2017  . 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 Autistic disorder 04/22/2015  . Bipolar disorder (HCC) 03/05/2015  . Impacted  teeth with abnormal position 12/23/2014    Expected post op course  Plan: Advance diet  Medically ready for discharge- group home being contacted by case management   LOS: 1 day     [name] MD [facility], [place]    05/25/2019 9:56 AM

## 2019-05-25 NOTE — Plan of Care (Signed)

## 2019-05-26 MED ORDER — ENOXAPARIN SODIUM 40 MG/0.4ML ~~LOC~~ SOLN
40.0000 mg | SUBCUTANEOUS | Status: DC
  Administered 2019-05-26: 40 mg via SUBCUTANEOUS

## 2019-05-26 MED ORDER — ACETAMINOPHEN 500 MG PO TABS: 1000.0000 mg | ORAL_TABLET | Freq: Four times a day (QID) | ORAL | Status: DC | PRN

## 2019-05-26 NOTE — Progress Notes (Signed)
Patient ID: Paul Hendricks, male   DOB: 13-Dec-1990, 29 y.o.   MRN: [id number] I called [name] at [facility] and they can take the patient today. Will D/C.  Violeta Gelinas, MD, MPH, FACS Please use [email or web address] to contact on call provider

## 2019-05-26 NOTE — Discharge Instructions (Signed)
[facility]  Please arrive at least 30 min before your appointment to complete your check in paperwork.  If you are unable to arrive 30 min prior to your appointment time we may have to cancel or reschedule you. LAPAROSCOPIC SURGERY: POST OP INSTRUCTIONS Always review your discharge instruction sheet given to you by the facility where your surgery was performed. IF YOU HAVE DISABILITY OR FAMILY LEAVE FORMS, YOU MUST BRING THEM TO THE OFFICE FOR PROCESSING.   DO NOT GIVE THEM TO YOUR DOCTOR.  PAIN CONTROL  1. First take acetaminophen (Tylenol) AND/or ibuprofen (Advil) to control your pain after surgery.  Follow directions on package.  Taking acetaminophen (Tylenol) and/or ibuprofen (Advil) regularly after surgery will help to control your pain and lower the amount of prescription pain medication you may need.  You should not take more than 4,000 mg (4 grams) of acetaminophen (Tylenol) in 24 hours.  You should not take ibuprofen (Advil), aleve, motrin, naprosyn or other NSAIDS if you have a history of stomach ulcers or chronic kidney disease.  2. A prescription for pain medication may be given to you upon discharge.  Take your pain medication as prescribed, if you still have uncontrolled pain after taking acetaminophen (Tylenol) or ibuprofen (Advil). 3. Use ice packs to help control pain. 4. If you need a refill on your pain medication, please contact your pharmacy.  They will contact our office to request authorization. Prescriptions will not be filled after 5pm or on week-ends.  HOME MEDICATIONS 5. Take your usually prescribed medications unless otherwise directed.  DIET 6. You should follow a light diet the first few days after arrival home.  Be sure to include lots of fluids daily. Avoid fatty, fried foods.   CONSTIPATION 7. It is common to experience some constipation after surgery and if you are taking pain medication.  Increasing fluid intake and taking a stool  softener (such as Colace) will usually help or prevent this problem from occurring.  A mild laxative (Milk of Magnesia or Miralax) should be taken according to package instructions if there are no bowel movements after 48 hours.  WOUND/INCISION CARE 8. Most patients will experience some swelling and bruising in the area of the incisions.  Ice packs will help.  Swelling and bruising can take several days to resolve.  9. Unless discharge instructions indicate otherwise, follow guidelines below  a. STERI-STRIPS - you may remove your outer bandages 48 hours after surgery, and you may shower at that time.  You have steri-strips (small skin tapes) in place directly over the incision.  These strips should be left on the skin for 7-10 days.   b. DERMABOND/SKIN GLUE - you may shower in 24 hours.  The glue will flake off over the next 2-3 weeks. 10. Any sutures or staples will be removed at the office during your follow-up visit.  ACTIVITIES 11. You may resume regular (light) daily activities beginning the next day--such as daily self-care, walking, climbing stairs--gradually increasing activities as tolerated.  You may have sexual intercourse when it is comfortable.  Refrain from any heavy lifting or straining until approved by your doctor. a. You may drive when you are no longer taking prescription pain medication, you can comfortably wear a seatbelt, and you can safely maneuver your car and apply brakes.  FOLLOW-UP 12. You should see your doctor in the office for a follow-up appointment approximately 2-3 weeks after your surgery.  You should have been given your post-op/follow-up appointment when   your surgery was scheduled.  If you did not receive a post-op/follow-up appointment, make sure that you call for this appointment within a day or two after you arrive home to insure a convenient appointment time.   WHEN TO CALL YOUR DOCTOR: 1. Fever over 101.0 2. Inability to urinate 3. Continued bleeding from  incision. 4. Increased pain, redness, or drainage from the incision. 5. Increasing abdominal pain  The clinic staff is available to answer your questions during regular business hours.  Please don't hesitate to call and ask to speak to one of the nurses for clinical concerns.  If you have a medical emergency, go to the nearest emergency room or call 911.  A surgeon from [facility] is always on call at the hospital. [address], [address], [place], [place]  [address] ? [address] [address], [place], [place]   [address] [phone] ? [phone] ? FAX [phone]  .........   Managing Your Pain After Surgery Without Opioids    Thank you for participating in our program to help patients manage their pain after surgery without opioids. This is part of our effort to provide you with the best care possible, without exposing you or your family to the risk that opioids pose.  What pain can I expect after surgery? You can expect to have some pain after surgery. This is normal. The pain is typically worse the day after surgery, and quickly begins to get better. Many studies have found that many patients are able to manage their pain after surgery with Over-the-Counter (OTC) medications such as Tylenol and Motrin. If you have a condition that does not allow you to take Tylenol or Motrin, notify your surgical team.  How will I manage my pain? The best strategy for controlling your pain after surgery is around the clock pain control with Tylenol (acetaminophen) and Motrin (ibuprofen or Advil). Alternating these medications with each other allows you to maximize your pain control. In addition to Tylenol and Motrin, you can use heating pads or ice packs on your incisions to help reduce your pain.  How will I alternate your regular strength over-the-counter pain medication? You will take a dose of pain medication every three hours. ; Start by taking 650 mg of Tylenol (2 pills of 325  mg) ; 3 hours later take 600 mg of Motrin (3 pills of 200 mg) ; 3 hours after taking the Motrin take 650 mg of Tylenol ; 3 hours after that take 600 mg of Motrin.   - 1 -  See example - if your first dose of Tylenol is at 12:00 PM   12:00 PM Tylenol 650 mg (2 pills of 325 mg)  3:00 PM Motrin 600 mg (3 pills of 200 mg)  6:00 PM Tylenol 650 mg (2 pills of 325 mg)  9:00 PM Motrin 600 mg (3 pills of 200 mg)  Continue alternating every 3 hours   We recommend that you follow this schedule around-the-clock for at least 3 days after surgery, or until you feel that it is no longer needed. Use the table on the last page of this handout to keep track of the medications you are taking. Important: Do not take more than 3000mg of Tylenol or 3200mg of Motrin in a 24-hour period. Do not take ibuprofen/Motrin if you have a history of bleeding stomach ulcers, severe kidney disease, &/or actively taking a blood thinner  What if I still have pain? If you have pain that is not   controlled with the over-the-counter pain medications (Tylenol and Motrin or Advil) you might have what we call "breakthrough" pain. You will receive a prescription for a small amount of an opioid pain medication such as Oxycodone, Tramadol, or Tylenol with Codeine. Use these opioid pills in the first 24 hours after surgery if you have breakthrough pain. Do not take more than 1 pill every 4-6 hours.  If you still have uncontrolled pain after using all opioid pills, don't hesitate to call our staff using the number provided. We will help make sure you are managing your pain in the best way possible, and if necessary, we can provide a prescription for additional pain medication.   Day 1    Time  Name of Medication Number of pills taken  Amount of Acetaminophen  Pain Level   Comments  AM PM       AM PM       AM PM       AM PM       AM PM       AM PM       AM PM       AM PM       Total Daily amount of Acetaminophen Do not  take more than  3,000 mg per day      Day 2    Time  Name of Medication Number of pills taken  Amount of Acetaminophen  Pain Level   Comments  AM PM       AM PM       AM PM       AM PM       AM PM       AM PM       AM PM       AM PM       Total Daily amount of Acetaminophen Do not take more than  3,000 mg per day      Day 3    Time  Name of Medication Number of pills taken  Amount of Acetaminophen  Pain Level   Comments  AM PM       AM PM       AM PM       AM PM          AM PM       AM PM       AM PM       AM PM       Total Daily amount of Acetaminophen Do not take more than  3,000 mg per day      Day 4    Time  Name of Medication Number of pills taken  Amount of Acetaminophen  Pain Level   Comments  AM PM       AM PM       AM PM       AM PM       AM PM       AM PM       AM PM       AM PM       Total Daily amount of Acetaminophen Do not take more than  3,000 mg per day      Day 5    Time  Name of Medication Number of pills taken  Amount of Acetaminophen  Pain Level   Comments  AM PM       AM PM       AM   PM       AM PM       AM PM       AM PM       AM PM       AM PM       Total Daily amount of Acetaminophen Do not take more than  3,000 mg per day       Day 6    Time  Name of Medication Number of pills taken  Amount of Acetaminophen  Pain Level  Comments  AM PM       AM PM       AM PM       AM PM       AM PM       AM PM       AM PM       AM PM       Total Daily amount of Acetaminophen Do not take more than  3,000 mg per day      Day 7    Time  Name of Medication Number of pills taken  Amount of Acetaminophen  Pain Level   Comments  AM PM       AM PM       AM PM       AM PM       AM PM       AM PM       AM PM       AM PM       Total Daily amount of Acetaminophen Do not take more than  3,000 mg per day        For additional information about how and where to safely dispose of unused  opioid medications - https://www.morepowerfulnc.org  Disclaimer: This document contains information and/or instructional materials adapted from [facility] for the typical patient with your condition. It does not replace medical advice from your health care provider because your experience may differ from that of the typical patient. Talk to your health care provider if you have any questions about this document, your condition or your treatment plan. Adapted from [facility]   

## 2019-05-26 NOTE — Plan of Care (Signed)

## 2019-05-26 NOTE — Discharge Summary (Signed)
    Patient ID: Paul Hendricks [id number] 04/27/1991 29 y.o.  Admit date: 05/23/2019 Discharge date: 05/26/2019  Admitting Diagnosis: Acute appendicitis   Discharge Diagnosis Patient Active Problem List   Diagnosis Date Noted  . Acute appendicitis 05/23/2019  . Acute constipation 10/10/2018  . Atopic dermatitis 10/10/2018  . Hypertriglyceridemia 01/12/2017  . 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 Autistic disorder 04/22/2015  . Bipolar disorder (HCC) 03/05/2015  . Impacted teeth with abnormal position 12/23/2014    Consultants none  Reason for Admission: 29 yo nonverbal autistic male found to have more aggression and agitation in the last 48 hours. A behavioral counselor went to evaluate him and IVC'ed him. He came here for medical clearance and on imaging, was found to have a dilated appendix.  Procedures Lap appy, Dr. Sheliah Hatch 05/23/19  Hospital Course:  The patient was admitted and underwent a laparoscopic appendectomy.  The patient tolerated the procedure well.  On POD 1, the patient was tolerating a regular diet, voiding well, mobilizing, and pain was controlled with oral pain medications.  However, he remained in the hospital for 2 more days awaiting placement status at his group home.  On POD 3, the patient was stable for DC home at this time with appropriate follow up made.     Physical Exam: See progress note from earlier today  Allergies as of 05/26/2019      Reactions   Atorvastatin Rash      Medication List    TAKE these medications   acetaminophen 500 MG tablet Commonly known as: TYLENOL Take 2 tablets (1,000 mg total) by mouth every 6 (six) hours as needed for mild pain or moderate pain (or temp > 100).   ARIPiprazole 20 MG tablet Commonly known as: ABILIFY Take 10 mg by mouth 2 (two) times daily. At 1600 and 2000 for aggression   atorvastatin 20 MG tablet Commonly known as:  LIPITOR Take 20 mg by mouth daily.   clonazePAM 1 MG tablet Commonly known as: KLONOPIN Take 1 mg by mouth 3 (three) times daily. At 0800, 1600 and 2000   divalproex 500 MG 24 hr tablet Commonly known as: DEPAKOTE ER Take 1 tablet in AM, 3 tablets in PM What changed:   how much to take  how to take this  when to take this  additional instructions   Fish Oil 1000 MG Caps Take 1,000 mg by mouth 2 (two) times daily.   guanFACINE 2 MG tablet Commonly known as: TENEX Take 2 mg by mouth 2 (two) times daily. At 1600 and 2000   Melatonin 5 MG Tabs Take 10 mg by mouth at bedtime.   naltrexone 50 MG tablet Commonly known as: DEPADE Take 25 mg by mouth 2 (two) times daily. At 1600 and 2000   traZODone 100 MG tablet Commonly known as: DESYREL Take 100 mg by mouth 2 (two) times daily. At 0800 and 2000 for aggression/sleep/anxiety        Follow-up Information    [facility] Follow up in 3 week(s).   Specialty: General Surgery Why: Our office will call you with an appointment date and time for your follow up appointment Contact information: [address] [place] [address] [phone]           Signed: Barnetta Chapel, [facility] 05/26/2019, 11:04 AM Please see Amion for pager number during day hours 7:00am-4:30pm

## 2019-05-26 NOTE — Plan of Care (Signed)
Pt remains physically aggressive when awake.    Problem: Education: Goal: Knowledge of General Education information will improve Description: Including pain rating scale, medication(s)/side effects and non-pharmacologic comfort measures Outcome: Progressing   Problem: Safety: Goal: Ability to remain free from injury will improve Outcome: Progressing

## 2019-05-26 NOTE — Progress Notes (Signed)
3 Days Post-Op lap appy Subjective: No acute events Sitter reports pt ate very well this am   Objective: Vital signs in last 24 hours: Temp:  [98.8 F (37.1 C)-98.9 F (37.2 C)] 98.8 F (37.1 C) (01/02 0724) Pulse Rate:  [88-105] 88 (01/02 0724) Resp:  [18-20] 18 (01/02 0724) BP: (134-140)/(81-110) 140/90 (01/02 0724) SpO2:  [99 %-100 %] 100 % (01/02 0724)   Intake/Output from previous day: 01/01 0701 - 01/02 0700 In: 1856.9 [P.O.:360; I.V.:1496.9] Out: 1000 [Urine:1000] Intake/Output this shift: No intake/output data recorded.   General appearance: alert and cooperative GI: normal findings: soft, non-tender Incisions c/d/i  Lab Results:  Recent Labs    05/23/19 1504 05/24/19 1130  WBC 7.7 15.2*  HGB 11.8* 11.6*  HCT 37.6* 35.8*  PLT 295 328   BMET Recent Labs    05/23/19 1504 05/24/19 1130  NA 141 140  K 4.2 4.1  CL 103 101  CO2 30 29  GLUCOSE 84 130*  BUN 8 6  CREATININE 1.04 1.11  CALCIUM 8.9 9.3   PT/INR No results for input(s): LABPROT, INR in the last 72 hours. ABG No results for input(s): PHART, HCO3 in the last 72 hours.  Invalid input(s): PCO2, PO2  MEDS, Scheduled . ARIPiprazole  10 mg Oral BID  . atorvastatin  20 mg Oral Daily  . clonazePAM  0.5 mg Oral 2 times per day  . clonazePAM  1 mg Oral QHS  . divalproex  500 mg Oral Daily   And  . divalproex  1,500 mg Oral QHS  . Melatonin  3 mg Oral BID  . midazolam  4 mg Intramuscular Once  . traZODone  100 mg Oral BID    Studies/Results: No results found.  Assessment: s/p Procedure(s): APPENDECTOMY LAPAROSCOPIC 12/30 by Dr Sheliah Hatch Patient Active Problem List   Diagnosis Date Noted  . Acute appendicitis 05/23/2019  . Acute constipation 10/10/2018  . Atopic dermatitis 10/10/2018  . Hypertriglyceridemia 01/12/2017  . 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 Autistic disorder 04/22/2015  . Bipolar disorder (HCC)  03/05/2015  . Impacted teeth with abnormal position 12/23/2014    Expected post op course  Plan:  Medically ready for discharge- group home being contacted by case management   LOS: 2 days     Gaynelle Adu MD [facility], [place]    05/26/2019 9:23 AM

## 2019-05-26 NOTE — Progress Notes (Signed)
13:00 - Patient discharged to group home employee, Marilu Favre.  Updated group home manager, over the telephone, as to what medications patient had received today and what medications patient was due to take later in the day.  Also, updated med list on discharge instructions to reflect when meds were given, and when next due.  Patient was escorted via wheelchair by 2 staff members to car.

## 2019-05-28 LAB — SURGICAL PATHOLOGY

## 2019-09-03 ENCOUNTER — Ambulatory Visit: Payer: Medicaid Other

## 2019-10-12 ENCOUNTER — Ambulatory Visit: Payer: Medicaid Other | Admitting: Neurology

## 2019-10-17 ENCOUNTER — Ambulatory Visit: Payer: Medicaid Other | Admitting: Neurology

## 2019-11-01 ENCOUNTER — Other Ambulatory Visit: Payer: Self-pay | Admitting: Neurology

## 2019-11-01 DIAGNOSIS — G40309 Generalized idiopathic epilepsy and epileptic syndromes, not intractable, without status epilepticus: Secondary | ICD-10-CM

## 2019-11-02 ENCOUNTER — Ambulatory Visit (INDEPENDENT_AMBULATORY_CARE_PROVIDER_SITE_OTHER): Payer: Medicaid Other | Admitting: Neurology

## 2019-11-02 ENCOUNTER — Encounter: Payer: Self-pay | Admitting: Neurology

## 2019-11-02 ENCOUNTER — Other Ambulatory Visit: Payer: Self-pay

## 2019-11-02 DIAGNOSIS — G40309 Generalized idiopathic epilepsy and epileptic syndromes, not intractable, without status epilepticus: Secondary | ICD-10-CM

## 2019-11-02 DIAGNOSIS — F79 Unspecified intellectual disabilities: Secondary | ICD-10-CM

## 2019-11-02 MED ORDER — DIVALPROEX SODIUM ER 500 MG PO TB24: ORAL_TABLET | ORAL | 11 refills | Status: DC

## 2019-11-02 NOTE — Progress Notes (Signed)
NEUROLOGY FOLLOW UP OFFICE NOTE  Paul Hendricks [id number] 1991/02/01  HISTORY OF PRESENT ILLNESS: I had the pleasure of seeing Paul Hendricks in follow-up in the neurology clinic on 11/02/2019.  The patient was last seen a year ago for seizures. He is accompanied by group home owner and legal guardian [name], who provides history, as he is non-verbal. Since his last visit, [name] denies any convulsions since 03/2018. He is on Depakote 500mg  in AM, 1500mg  in PM. She notices petit mals where he would drift his eyes briefly them come back, no falls. He was in the hospital in December for loss of appetite and weakness, he was refusing food and medications. He was found to have acute appendicitis and underwent laparoscopic surgery. During his stay, Depakote level done was 93. He continues to have self-injurious behaviors and follows regularly with Psychiatry. He is on clonazepam, guanfacine, Trazodone, naltrexone, and Abilify. Sleep is off and on, sometimes he wakes up early. He is total care with all IADLS.   History on Initial Assessment 04/22/2015: Paul Hendricks is a 29 year old man with severe intellectual deficits, autism spectrum disorder, mood disorder, and seizures since childhood. Staff does not know much about his seizure history, but mother reported seizures in childhood prior to moving to his current group home 10 years ago. Review of psychological evaluation records indicate that he started having language delays at age 29, and was diagnosed with autism. He can be very aggressive with self-injurious behavior (banging head repeatedly on wall) and needs one-on-one supervision at all times. He can point and sign a little to indicate his needs, he can feed himself but needs someone to cut his food. Group home staff have witnessed 2 seizures in the past 10 years. He had a seizure 5 years ago in the playground, he suddenly stiffened up for around 5-10 minutes, was brought to the ER and discharged  home. He has been taking Depakote ER 750mg  (250mg  tablet + 500mg  tablet) qhs with no changes, no side effects. He had another seizure last 03/04/15, staff reports he was very hyperactive that day, jumping up and down, then fell and started convulsing for a few minutes. No tongue bite or significant injuries. He saw his PCP the next day, bloodwork reviewed, CBC and CMP normal, Depakote level 48. Staff denies any missed medications, recent infections, or sleep pattern changes.   Update 04/2018: His longest seizure-free interval has been 5 years, he had a breakthrough seizure in October 2016, Depakote dose increased to 1000mg  qhs, then had another seizure in November 2017, Depakote level was 13. Dose increased to 1250mg  qhs. On his last visit, he had a convulsion in July 2018 and dose was increased to 1500mg  qhs. He also has "petit ones" where he would be staring off occasionally. Group home staff is concerned about the significant increase in seizures over the past 8 months. He had a nocturnal convulsion on 07/16/17 with whole body jerking and drooling lasting 5 seconds. On 12/26/17, he was jumping around (usual behavior) then slipped and fell, followed by whole body shaking for 3 minutes. On 01/31/18, he was pulling up underwear then fell and started shaking. He refused to go to the lab the next day and staff was finally able to get him to the ER on 9/13 where he had a head CT without contrast which I personally reviewed, no acute intracranial abnormalities. He had another seizure on 9/21, he was walking around, constantly banging on the wall, then fell with shaking.  He fell asleep after. The last seizure occurred on 11/16, he woke up suddenly at 3:30am anxious and restless, then fell back on his bed with shaking for 2 minutes. He had another one at 8am lasting 3 minutes sustaining minor abrasions on his head from another fall.   Staff is concerned about the significant increase in seizures. His behavior has also  been worsening the past few months, staff reports that the behavior they are seeing now is similar to changes 29 years ago. His psychiatrist made changes to his medications every month since August, Risperdal was discontinued, Trazodone and clonazepam increased, Ziprasidone added. His behavior now is not as bad as Aug/Sept but they are still not back to his baseline. There have been no changes in his environment, it is unclear what the triggers are for behavioral changes and increase in seizures. He takes his medications. Last Depakote level in June 2019 was 75.   Diagnostic Data: I personally reviewed head CT available from 07/2005 with indication of new onset seizure, unremarkable, no acute changes seen.   PAST MEDICAL HISTORY: Past Medical History:  Diagnosis Date  . Anxiety   . Autism disorder    WITH BEHAVIORAL ISSUES  . Seizures (HCC)    LAST SEIZURE 5-6 YRS AGO       Current Outpatient Medications on File Prior to Visit  Medication Sig Dispense Refill  . acetaminophen (TYLENOL) 500 MG tablet Take 2 tablets (1,000 mg total) by mouth every 6 (six) hours as needed for mild pain or moderate pain (or temp > 100).    . ARIPiprazole (ABILIFY) 20 MG tablet Take 10 mg by mouth 2 (two) times daily. At 1600 and 2000 for aggression    . atorvastatin (LIPITOR) 20 MG tablet Take 20 mg by mouth daily.    . clonazePAM (KLONOPIN) 1 MG tablet Take 1 mg by mouth 3 (three) times daily. At 0800, 1600 and 2000    . divalproex (DEPAKOTE ER) 500 MG 24 hr tablet Take 1 tablet in AM, 3 tablets in PM (Patient taking differently: Take 500-1,500 mg by mouth See admin instructions. Take 500mg  in the morning and 1500mg  in the evening.) 120 tablet 11  . guanFACINE (TENEX) 2 MG tablet Take 2 mg by mouth 2 (two) times daily. At 1600 and 2000    . Melatonin 5 MG TABS Take 10 mg by mouth at bedtime.     . naltrexone (DEPADE) 50 MG tablet Take 25 mg by mouth 2 (two) times daily. At 1600 and 2000    . Omega-3 Fatty  Acids (FISH OIL) 1000 MG CAPS Take 1,000 mg by mouth 2 (two) times daily.    . traZODone (DESYREL) 100 MG tablet Take 100 mg by mouth 2 (two) times daily. At 0800 and 2000 for aggression/sleep/anxiety     No current facility-administered medications on file prior to visit.    ALLERGIES: No Active Allergies  FAMILY HISTORY: History reviewed. No pertinent family history.  SOCIAL HISTORY: Social History   Socioeconomic History  . Marital status: Single    Spouse name: Not on file  . Number of children: Not on file  . Years of education: Not on file  . Highest education level: Not on file  Occupational History  . Not on file  Tobacco Use  . Smoking status: Never Smoker  . Smokeless tobacco: Never Used  Substance and Sexual Activity  . Alcohol use: No    Alcohol/week: 0.0 standard drinks  . Drug use: No  .  Sexual activity: Not on file  Other Topics Concern  . Not on file  Social History Narrative  . Not on file   Social Determinants of Health   Financial Resource Strain:   . Difficulty of Paying Living Expenses:   Food Insecurity:   . Worried About Programme researcher, broadcasting/film/video in the Last Year:   . Barista in the Last Year:   Transportation Needs:   . Freight forwarder (Medical):   Marland Kitchen Lack of Transportation (Non-Medical):   Physical Activity:   . Days of Exercise per Week:   . Minutes of Exercise per Session:   Stress:   . Feeling of Stress :   Social Connections:   . Frequency of Communication with Friends and Family:   . Frequency of Social Gatherings with Friends and Family:   . Attends Religious Services:   . Active Member of Clubs or Organizations:   . Attends Banker Meetings:   Marland Kitchen Marital Status:   Intimate Partner Violence:   . Fear of Current or Ex-Partner:   . Emotionally Abused:   Marland Kitchen Physically Abused:   . Sexually Abused:     PHYSICAL EXAM: Unable to obtain vital signs, patient slightly agitated, walking around, does not follow  commands General: No acute distress, restless Head:  Normocephalic/atraumatic Skin/Extremities: No rash, no edema Neurological Exam: limited. Patient is restless, walking around the room, he vocalizes loudly but does not say any words. Does not follow commands. Appears to move all extremities symmetrically.   IMPRESSION: This is a 29 yo man with severe intellectual disability, autism spectrum disorder, mood/behavioral disorder, and seizures since childhood. Longest seizure-free interval was 5 years then he had an increase in seizures. No convulsions  since Depakote increased to 500mg  in AM, 1000mg  in PM in 03/2018. Caregiver reports brief petit mals around once a week. Continue current medications. Continue follow-up with Psychiatry. He has 24/7 supervision. Follow-up in 1 year, [name] knows to call for any changes.   Thank you for allowing me to participate in his care.  Please do not hesitate to call for any questions or concerns.   , M.D.   CC: 04/2018, NP
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
## 2019-12-04 ENCOUNTER — Encounter: Payer: Self-pay | Admitting: Gastroenterology

## 2019-12-17 ENCOUNTER — Telehealth: Payer: Self-pay | Admitting: Neurology

## 2019-12-17 NOTE — Telephone Encounter (Signed)
AccessNurse 12/15/19 @ 10:11PM:  "Caller states that the patient had 2 seizures since 6pm. He vomited after the second one.  Nurse assessment: Caller (caregiver) states that the patient had 2 seizures since 6pm. He vomited after the second seizure. Has history of seizures. First seizure (around 1800) lasted about 5 minutes and second one (around 2030) lasted 2 minutes. Whole body jerking and eyes rolling back. Usually petit mal, not grand mal. No other symptoms at this time.  Per seizure guideline: caller instructed to go to ED now. Caller understands and will comply."

## 2019-12-17 NOTE — Telephone Encounter (Signed)
If he is very combative and this is a change from his usual behavior, maybe bring to ER or as his psychiatrist suggested , mental health crisis admission.

## 2019-12-17 NOTE — Telephone Encounter (Signed)
[name] called in and left a message. She wants to speak with someone about the patient's seizure he had this past weekend.
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
## 2019-12-17 NOTE — Telephone Encounter (Signed)
Spoke with caregiver and told her that Dr Karel Jarvis recommends taking pt to ED or doing a mental health crisis admission as suggested by psychiatry since behavior is so extreme which is not his baseline. She states the ED is not feasible, in her opinion, d/t previous experience so she will contact his psychiatrist and follow his advice.
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
## 2019-12-17 NOTE — Telephone Encounter (Signed)
Would make sure there is nothing bothering him like an infection that he cannot verbalize, would check CBC, CMP, urinalysis, also Depakote level. She can try increasing the Depakote 500mg : take 2 in AM, 3 in PM but agree if behaviors continue, he would need evaluation. Thanks

## 2019-12-20 ENCOUNTER — Emergency Department (HOSPITAL_COMMUNITY)
Admission: EM | Admit: 2019-12-20 | Discharge: 2019-12-22 | Disposition: A | Discharge status: Home / Self Care | Payer: Medicaid Other | Attending: Emergency Medicine | Admitting: Emergency Medicine

## 2019-12-20 ENCOUNTER — Emergency Department (HOSPITAL_COMMUNITY): Payer: Medicaid Other

## 2019-12-20 ENCOUNTER — Other Ambulatory Visit: Payer: Self-pay

## 2019-12-20 DIAGNOSIS — S0181XA Laceration without foreign body of other part of head, initial encounter: Secondary | ICD-10-CM | POA: Insufficient documentation

## 2019-12-20 DIAGNOSIS — R456 Violent behavior: Secondary | ICD-10-CM | POA: Insufficient documentation

## 2019-12-20 DIAGNOSIS — Y92009 Unspecified place in unspecified non-institutional (private) residence as the place of occurrence of the external cause: Secondary | ICD-10-CM | POA: Insufficient documentation

## 2019-12-20 DIAGNOSIS — S0990XA Unspecified injury of head, initial encounter: Secondary | ICD-10-CM | POA: Diagnosis present

## 2019-12-20 DIAGNOSIS — R451 Restlessness and agitation: Secondary | ICD-10-CM | POA: Diagnosis not present

## 2019-12-20 DIAGNOSIS — W228XXA Striking against or struck by other objects, initial encounter: Secondary | ICD-10-CM | POA: Insufficient documentation

## 2019-12-20 DIAGNOSIS — R569 Unspecified convulsions: Secondary | ICD-10-CM | POA: Diagnosis not present

## 2019-12-20 DIAGNOSIS — F840 Autistic disorder: Secondary | ICD-10-CM | POA: Diagnosis not present

## 2019-12-20 DIAGNOSIS — Y939 Activity, unspecified: Secondary | ICD-10-CM | POA: Insufficient documentation

## 2019-12-20 DIAGNOSIS — Z20822 Contact with and (suspected) exposure to covid-19: Secondary | ICD-10-CM | POA: Insufficient documentation

## 2019-12-20 DIAGNOSIS — Y999 Unspecified external cause status: Secondary | ICD-10-CM | POA: Insufficient documentation

## 2019-12-20 DIAGNOSIS — R4689 Other symptoms and signs involving appearance and behavior: Secondary | ICD-10-CM | POA: Diagnosis not present

## 2019-12-20 LAB — CBC WITH DIFFERENTIAL/PLATELET
Abs Immature Granulocytes: 0.02 10*3/uL (ref 0.00–0.07)
Basophils Absolute: 0 10*3/uL (ref 0.0–0.1)
Basophils Relative: 0 %
Eosinophils Absolute: 0 10*3/uL (ref 0.0–0.5)
Eosinophils Relative: 0 %
HCT: 33.6 % — ABNORMAL LOW (ref 39.0–52.0)
Hemoglobin: 10.9 g/dL — ABNORMAL LOW (ref 13.0–17.0)
Immature Granulocytes: 0 %
Lymphocytes Relative: 38 %
Lymphs Abs: 2.9 10*3/uL (ref 0.7–4.0)
MCH: 30.3 pg (ref 26.0–34.0)
MCHC: 32.4 g/dL (ref 30.0–36.0)
MCV: 93.3 fL (ref 80.0–100.0)
Monocytes Absolute: 0.7 10*3/uL (ref 0.1–1.0)
Monocytes Relative: 10 %
Neutro Abs: 4 10*3/uL (ref 1.7–7.7)
Neutrophils Relative %: 52 %
Platelets: 141 10*3/uL — ABNORMAL LOW (ref 150–400)
RBC: 3.6 MIL/uL — ABNORMAL LOW (ref 4.22–5.81)
RDW: 11.3 % — ABNORMAL LOW (ref 11.5–15.5)
WBC: 7.7 10*3/uL (ref 4.0–10.5)
nRBC: 0 % (ref 0.0–0.2)

## 2019-12-20 LAB — COMPREHENSIVE METABOLIC PANEL
ALT: 38 U/L (ref 0–44)
AST: 53 U/L — ABNORMAL HIGH (ref 15–41)
Albumin: 3.5 g/dL (ref 3.5–5.0)
Alkaline Phosphatase: 34 U/L — ABNORMAL LOW (ref 38–126)
Anion gap: 11 (ref 5–15)
BUN: 12 mg/dL (ref 6–20)
CO2: 22 mmol/L (ref 22–32)
Calcium: 9.1 mg/dL (ref 8.9–10.3)
Chloride: 108 mmol/L (ref 98–111)
Creatinine, Ser: 0.95 mg/dL (ref 0.61–1.24)
GFR calc Af Amer: >60 mL/min (ref >60)
GFR calc non Af Amer: >60 mL/min (ref >60)
Glucose, Bld: 90 mg/dL (ref 70–99)
Potassium: 3.9 mmol/L (ref 3.5–5.1)
Sodium: 141 mmol/L (ref 135–145)
Total Bilirubin: 0.6 mg/dL (ref 0.3–1.2)
Total Protein: 6.5 g/dL (ref 6.5–8.1)

## 2019-12-20 LAB — VALPROIC ACID LEVEL: Valproic Acid Lvl: 83 ug/mL (ref 50.0–100.0)

## 2019-12-20 LAB — AMMONIA: Ammonia: 57 umol/L — ABNORMAL HIGH (ref 9–35)

## 2019-12-20 LAB — SARS CORONAVIRUS 2 BY RT PCR (HOSPITAL ORDER, PERFORMED IN ~~LOC~~ HOSPITAL LAB): SARS Coronavirus 2: NEGATIVE

## 2019-12-20 MED ORDER — SODIUM CHLORIDE 0.9 % IV SOLN: 40.0000 mg | Freq: Once | INTRAVENOUS | Status: DC

## 2019-12-20 MED ORDER — MEGESTROL ACETATE 40 MG PO TABS
40.0000 mg | ORAL_TABLET | Freq: Three times a day (TID) | ORAL | Status: DC
  Administered 2019-12-21: 40 mg via ORAL
  Filled 2019-12-20 (×8): qty 1

## 2019-12-20 MED ORDER — CLONAZEPAM 0.5 MG PO TABS
1.0000 mg | ORAL_TABLET | Freq: Three times a day (TID) | ORAL | Status: DC
  Administered 2019-12-21 – 2019-12-22 (×3): 1 mg via ORAL
  Filled 2019-12-20 (×4): qty 2

## 2019-12-20 MED ORDER — TRAZODONE HCL 100 MG PO TABS
100.0000 mg | ORAL_TABLET | Freq: Two times a day (BID) | ORAL | Status: DC
  Administered 2019-12-21 – 2019-12-22 (×2): 100 mg via ORAL
  Filled 2019-12-20 (×2): qty 1

## 2019-12-20 MED ORDER — LORAZEPAM 2 MG/ML IJ SOLN: 2.0000 mg | Freq: Once | INTRAMUSCULAR | Status: DC

## 2019-12-20 MED ORDER — ARIPIPRAZOLE 10 MG PO TABS
10.0000 mg | ORAL_TABLET | Freq: Two times a day (BID) | ORAL | Status: DC
  Administered 2019-12-21 – 2019-12-22 (×2): 10 mg via ORAL
  Filled 2019-12-20 (×2): qty 1

## 2019-12-20 MED ORDER — ZIPRASIDONE MESYLATE 20 MG IM SOLR
20.0000 mg | Freq: Once | INTRAMUSCULAR | Status: AC
  Administered 2019-12-20: 20 mg via INTRAMUSCULAR
  Filled 2019-12-20: qty 20

## 2019-12-20 MED ORDER — DIVALPROEX SODIUM ER 500 MG PO TB24
500.0000 mg | ORAL_TABLET | ORAL | Status: DC
  Administered 2019-12-21: 500 mg via ORAL
  Filled 2019-12-20: qty 1

## 2019-12-20 MED ORDER — ACETAMINOPHEN 500 MG PO TABS: 1000.0000 mg | ORAL_TABLET | Freq: Four times a day (QID) | ORAL | Status: DC | PRN

## 2019-12-20 MED ORDER — DIPHENHYDRAMINE HCL 50 MG/ML IJ SOLN
25.0000 mg | Freq: Once | INTRAMUSCULAR | Status: AC
  Administered 2019-12-20: 25 mg via INTRAMUSCULAR
  Filled 2019-12-20: qty 1

## 2019-12-20 MED ORDER — LORAZEPAM 2 MG/ML IJ SOLN
2.0000 mg | Freq: Once | INTRAMUSCULAR | Status: AC
  Administered 2019-12-20: 2 mg via INTRAMUSCULAR
  Filled 2019-12-20: qty 1

## 2019-12-20 MED ORDER — HALOPERIDOL LACTATE 5 MG/ML IJ SOLN
5.0000 mg | Freq: Once | INTRAMUSCULAR | Status: AC
  Administered 2019-12-20: 5 mg via INTRAMUSCULAR
  Filled 2019-12-20: qty 1

## 2019-12-20 NOTE — ED Notes (Signed)
TTS attempted. Pt unable to participate. Continues to scream, yell, and kick the stretcher.

## 2019-12-20 NOTE — BH Assessment (Signed)
Per Drue Flirt, RN pt is being IVC's EDP. Per Candy, RN pt is being administered Haldol, Geodon and Ativan.  Per Drue Flirt, RN there are no triage notes on pt.    Redmond Pulling, MS, [facility], [facility] Triage Specialist [phone]

## 2019-12-20 NOTE — ED Notes (Signed)
Per Consulting civil engineer, IVC papers are in process.
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
## 2019-12-20 NOTE — ED Notes (Signed)
Pt getting increasing more aggressive. Hitting himself, screaming, kicking the stretcher, biting himself. MD has been notified. No new orders. Pt is upsetting all other patients in the unit.

## 2019-12-20 NOTE — ED Notes (Signed)
Report received. Unsure if pt is IVC'd or not. Papers not found. Will speak with MD to see if patient has been IVC'd. Pt yelling constantly from room. Sitter at bedside. No triage note in chart.

## 2019-12-20 NOTE — ED Notes (Signed)
Pt is yelling and biting himself and becoming increasing more agitated.
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
## 2019-12-20 NOTE — ED Notes (Signed)
Per Dr. Silverio Lay, If we cannot find pts IVC paperwork, pt will be voluntary. Dr. Rubin Payor did not see pts IVC paperwork either. Pts first RN, Liz Beach did not see IVC paperwork either. Called the magistrate and [employer] to see if they knew anything about IVC served and they had no information either. Charge RN aware.

## 2019-12-20 NOTE — ED Notes (Signed)
MD contacted for medication order.
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
## 2019-12-20 NOTE — ED Notes (Signed)
[facility] called to do TTS on pt. Pt unable to communicate to do TTS.

## 2019-12-20 NOTE — ED Provider Notes (Addendum)
  Physical Exam  BP (!) 118/95   Pulse 81   Temp 99 F (37.2 C) (Axillary)   Resp (!) 26   SpO2 100%   Physical Exam  ED Course/Procedures     Procedures  MDM  Patient care assumed at 4 PM.  Patient is here with agitation. Patient is on Depakote which can increase his ammonia level.  His ammonia levels are 57.  Signout pending chemistry and psych eval.  5:31 PM Chemistry were unremarkable.  AST is only 53 but ALT is normal and alk phos is low and bilirubin is normal.  At this point, patient is medically cleared for psych eval.  10 PM Patient became agitated and yelling at staff.  Nursing was unable to find IVC paperwork.  Since he is agitated, I filled out IVC paperwork.  Psych will need to see patient.    Drenda Freeze, MD 12/20/19 1731    Drenda Freeze, MD 12/20/19 [phone]

## 2019-12-20 NOTE — ED Notes (Addendum)
Pt continues to yell out, kick his legs on the stretcher and bite himself even after being medicated. Will continue to monitor. Sitter remains at bedside.

## 2019-12-20 NOTE — ED Notes (Signed)
Tried to call [facility] @ [phone] to obtain information, no answer. Called 10 times.

## 2019-12-21 DIAGNOSIS — R569 Unspecified convulsions: Secondary | ICD-10-CM | POA: Diagnosis not present

## 2019-12-21 DIAGNOSIS — R4689 Other symptoms and signs involving appearance and behavior: Secondary | ICD-10-CM | POA: Diagnosis not present

## 2019-12-21 LAB — URINALYSIS, ROUTINE W REFLEX MICROSCOPIC
Bilirubin Urine: NEGATIVE
Glucose, UA: NEGATIVE mg/dL
Hgb urine dipstick: NEGATIVE
Ketones, ur: 5 mg/dL — AB
Leukocytes,Ua: NEGATIVE
Nitrite: NEGATIVE
Protein, ur: NEGATIVE mg/dL
Specific Gravity, Urine: 1.027 (ref 1.005–1.030)
pH: 6 (ref 5.0–8.0)

## 2019-12-21 LAB — RAPID URINE DRUG SCREEN, HOSP PERFORMED
Amphetamines: NOT DETECTED
Barbiturates: NOT DETECTED
Benzodiazepines: POSITIVE — AB
Cocaine: NOT DETECTED
Opiates: NOT DETECTED
Tetrahydrocannabinol: NOT DETECTED

## 2019-12-21 MED ORDER — ZIPRASIDONE MESYLATE 20 MG IM SOLR: 20.0000 mg | Freq: Once | INTRAMUSCULAR | Status: AC

## 2019-12-21 MED ORDER — HALOPERIDOL LACTATE 5 MG/ML IJ SOLN
5.0000 mg | Freq: Once | INTRAMUSCULAR | Status: AC
  Administered 2019-12-21: 5 mg via INTRAMUSCULAR
  Filled 2019-12-21: qty 1

## 2019-12-21 MED ORDER — LORAZEPAM 1 MG PO TABS
2.0000 mg | ORAL_TABLET | Freq: Four times a day (QID) | ORAL | Status: DC | PRN
  Administered 2019-12-21: 2 mg via ORAL
  Filled 2019-12-21: qty 2

## 2019-12-21 MED ORDER — ZIPRASIDONE MESYLATE 20 MG IM SOLR
INTRAMUSCULAR | Status: AC
  Administered 2019-12-21: 20 mg via INTRAMUSCULAR
  Filled 2019-12-21: qty 20

## 2019-12-21 MED ORDER — HALOPERIDOL 5 MG PO TABS
5.0000 mg | ORAL_TABLET | Freq: Four times a day (QID) | ORAL | Status: DC | PRN
  Administered 2019-12-21: 5 mg via ORAL
  Filled 2019-12-21: qty 1

## 2019-12-21 MED ORDER — LORAZEPAM 2 MG/ML IJ SOLN
2.0000 mg | Freq: Once | INTRAMUSCULAR | Status: AC
  Administered 2019-12-21: 2 mg via INTRAVENOUS
  Filled 2019-12-21: qty 1

## 2019-12-21 MED ORDER — HALOPERIDOL LACTATE 5 MG/ML IJ SOLN
10.0000 mg | Freq: Four times a day (QID) | INTRAMUSCULAR | Status: DC | PRN
  Administered 2019-12-21: 10 mg via INTRAMUSCULAR
  Filled 2019-12-21: qty 2

## 2019-12-21 MED ORDER — DIPHENHYDRAMINE HCL 50 MG/ML IJ SOLN: 50.0000 mg | Freq: Four times a day (QID) | INTRAMUSCULAR | Status: DC | PRN

## 2019-12-21 MED ORDER — LORAZEPAM 2 MG/ML IJ SOLN
2.0000 mg | Freq: Once | INTRAMUSCULAR | Status: AC
  Administered 2019-12-21: 2 mg via INTRAMUSCULAR
  Filled 2019-12-21: qty 1

## 2019-12-21 MED ORDER — STERILE WATER FOR INJECTION IJ SOLN
INTRAMUSCULAR | Status: AC
  Filled 2019-12-21: qty 10

## 2019-12-21 MED ORDER — DIPHENHYDRAMINE HCL 25 MG PO CAPS
50.0000 mg | ORAL_CAPSULE | Freq: Four times a day (QID) | ORAL | Status: DC | PRN
  Administered 2019-12-21: 50 mg via ORAL
  Filled 2019-12-21: qty 2

## 2019-12-21 MED ORDER — LORAZEPAM 2 MG/ML IJ SOLN
2.0000 mg | Freq: Four times a day (QID) | INTRAMUSCULAR | Status: DC | PRN
  Administered 2019-12-21: 2 mg via INTRAMUSCULAR
  Filled 2019-12-21: qty 1

## 2019-12-21 NOTE — ED Notes (Signed)
Pt continues to yell and scream and kick stretcher. Pt also continues to bite himself. Does not seem to be tired at all.

## 2019-12-21 NOTE — Care Management (Signed)
ED CM attempted contact legal guardian Adria Dill (Mother) phone not accepting calls.  CM will attempt to find contact information for Group Home

## 2019-12-21 NOTE — ED Notes (Signed)
RN contacted [name] at [facility] for medication recommendations at this time.

## 2019-12-21 NOTE — ED Notes (Signed)
Pt continues to yell, biting his fingers and hand.

## 2019-12-21 NOTE — Consult Note (Signed)
  Writer asked to consult patient while in the emergency room for medication recommendations.  This practitioner assessed patient with TTS and person.  Patient was observed to be lying in bed in four-point restraints, looking around, making sounds and was unable to assess patient.  Patient is nonverbal and would not be able to participate in assessment.  Writer did discuss with Dr. Stevie Kern regarding patient's worsening aggression and agitation.  It does appear that the patient labs have returned and are within normal range with the exception of liver enzymes which are mildly elevated and ammonia levels which is also elevated.  Patient is on Depakote which can increase ammonia levels, this was recently increased last week after patient had seizures.  Per chart review patient had two repeated grand mal seizures(first 1 lasted about 5 minutes a second one  lasted about 2 minutes).  Patient normally has petit mall seizures not grand mal seizures.  And is also noted that patient vomited after his second seizure.  Patient has disorder of autism, behavioral disturbances and is nonverbal at baseline.  Will need to obtain collateral from group home in order to determine for any other acute changes.  It is possible that his worsening agitation and increased level of aggression is secondary to his seizures that occurred 5 days ago.  It is not uncommon for post ictal violent aggression to take place several days after a seizure.  It is my recommendation to consult neurology as medical labs are normal and patient appears to be at his psychiatric baseline.  Patient does not appear to be improving with medication and he has been resumed on his home meds Abilify 10 mg p.o. twice daily, Klonopin 1 mg p.o. 3 times daily, Depakote 1000 mg p.o. every morning and Depakote 1500 mg p.o. nightly.  Per chart review patient Megace was just adjusted however this would not explain his recent agitation and aggression.  Patient has received  multiple antipsychotic and sedating medications IM, and appears to have no improvement.  Patient may benefit from starting alpha 2 agonist such as clonidine or Intuniv to help with his agitation and aggression, however will need to contact legal guardian source to obtain consent.  At this time patient remains under IVC and can continue to receive emergency medications to protect himself and others.  Patient will need to be evaluated by neurology.  The other goals to practice include decreased stimuli such as dimming the light, quiter area,  Calming objects, soft music.  Also if anyone is able to contact legal guardian to group home to find out what other things they do to help keep patient safe and calm at home.  Ideally once patient is medically and psychiatrically cleared will need to return to the group home promptly to decrease behaviors, as the emergency room remains unsafe place for persons with autism as they can decompensate quickly.

## 2019-12-21 NOTE — Social Work (Signed)
CSW reached group home owner [name] to discuss Pt discharge. @ [phone]  Ms. Okonji requests that psychiatry call her so that she can gather more information about medications.  CSW will call psychiatric NP at [facility] to facilitate call.  CSW was also able to gather information that Pt soothes to "calming Spanish guitar music" at group home.  CSW informed sitter of information.
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
## 2019-12-21 NOTE — ED Provider Notes (Signed)
Psychiatry has evaluated and recommend d/c back to group home, and also recommended neurology consult.   I spoke with Dr Otelia Limes, he indicates given recent adjustment in depakote does, that he would not be inclined to adjust further, esp as level and dose appear appropriate.  In general, feels that pt can f/u with neurology as outpt, but he will attempt to contact psych/Dr Lucianne Muss directly to further discuss.   Pt appears to remain c/w baseline. No seizure activity noted. Content, alert, with periodic loud vocalizations. Vital signs normal.      Cathren Laine, MD 12/21/19 1447
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
## 2019-12-21 NOTE — ED Notes (Signed)
Pt continues to yell, he is banging on side rails. Sitter at bedside.
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
## 2019-12-21 NOTE — ED Notes (Signed)
Pt continues to yell loudly. Tolerating water well. Sitter at bedside. No change in behavior since med administration

## 2019-12-22 DIAGNOSIS — R451 Restlessness and agitation: Secondary | ICD-10-CM

## 2019-12-22 DIAGNOSIS — F840 Autistic disorder: Secondary | ICD-10-CM

## 2019-12-22 MED ORDER — LORAZEPAM 2 MG/ML IJ SOLN
1.0000 mg | Freq: Once | INTRAMUSCULAR | Status: AC
  Administered 2019-12-22: 1 mg via INTRAMUSCULAR
  Filled 2019-12-22: qty 1

## 2019-12-22 MED ORDER — HALOPERIDOL LACTATE 5 MG/ML IJ SOLN
10.0000 mg | Freq: Once | INTRAMUSCULAR | Status: AC
  Administered 2019-12-22: 10 mg via INTRAMUSCULAR
  Filled 2019-12-22: qty 2

## 2019-12-22 MED ORDER — MIDAZOLAM HCL 2 MG/2ML IJ SOLN
2.0000 mg | Freq: Once | INTRAMUSCULAR | Status: AC
  Administered 2019-12-22: 2 mg via INTRAMUSCULAR
  Filled 2019-12-22: qty 2

## 2019-12-22 MED ORDER — TOPIRAMATE 25 MG PO TABS
25.0000 mg | ORAL_TABLET | Freq: Two times a day (BID) | ORAL | Status: DC
  Administered 2019-12-22: 25 mg via ORAL
  Filled 2019-12-22: qty 1

## 2019-12-22 MED ORDER — DIPHENHYDRAMINE HCL 50 MG/ML IJ SOLN
25.0000 mg | Freq: Once | INTRAMUSCULAR | Status: AC
  Administered 2019-12-22: 25 mg via INTRAMUSCULAR
  Filled 2019-12-22: qty 1

## 2019-12-22 MED ORDER — MIDAZOLAM HCL 2 MG/2ML IJ SOLN: 2.0000 mg | Freq: Once | INTRAMUSCULAR | Status: DC

## 2019-12-22 MED ORDER — ZIPRASIDONE MESYLATE 20 MG IM SOLR: 20.0000 mg | Freq: Three times a day (TID) | INTRAMUSCULAR | Status: DC | PRN

## 2019-12-22 NOTE — ED Notes (Signed)
ALMOLST CONSTANT YELLING
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
## 2019-12-22 NOTE — ED Provider Notes (Signed)
Emergency Medicine Observation Re-evaluation Note  Paul Hendricks is a 29 y.o. male, seen on rounds today.  Pt initially presented to the ED for complaints of Aggressive Behavior Currently, the patient is in restraints due to being aggressive with the sitter and trying to damage the TV on the wall.  However restraints were removed by 8 AM and patient is sitting in the bed fidgety and slightly agitated intermittently vocalizing sounds but is nonverbal.  He did grab one of the sitters by the arm and caused mild harm but was also eating with assistance.  He has had no vomiting, fever, cough or signs of shortness of breath.  He has not had a bowel movement since arrival.  Physical Exam  BP (!) 126/86 (BP Location: Left Arm)   Pulse 99   Temp 98 F (36.7 C) (Axillary)   Resp 18   SpO2 99%  Physical Exam Constitutional:      Comments: Pt is intermittently shouting and ringing his hands.  Rocking in the bed  HENT:     Head: Normocephalic.  Cardiovascular:     Rate and Rhythm: Normal rate.     Pulses: Normal pulses.  Pulmonary:     Effort: Pulmonary effort is normal. No respiratory distress.     Breath sounds: Normal breath sounds.  Abdominal:     Comments: abdomen is soft.  No apparent pain  Musculoskeletal:     Cervical back: Normal range of motion and neck supple.     Right lower leg: No edema.     Left lower leg: No edema.  Skin:    General: Skin is warm and dry.  Neurological:     Mental Status: He is alert.     Comments: Nonverbal and does not follow commands.  No obvious seizure like activity  Psychiatric:     Comments: Intermittently agitated and agressive    Physical Exam Constitutional:      Comments: Pt is intermittently shouting and ringing his hands.  Rocking in the bed  HENT:     Head: Normocephalic.  Cardiovascular:     Rate and Rhythm: Normal rate.     Pulses: Normal pulses.  Pulmonary:     Effort: Pulmonary effort is normal. No respiratory distress.      Breath sounds: Normal breath sounds.  Abdominal:     Comments: abdomen is soft.  No apparent pain  Musculoskeletal:     Cervical back: Normal range of motion and neck supple.     Right lower leg: No edema.     Left lower leg: No edema.  Skin:    General: Skin is warm and dry.  Neurological:     Mental Status: He is alert.     Comments: Nonverbal and does not follow commands.  No obvious seizure like activity  Psychiatric:     Comments: Intermittently agitated and agressive     ED Course / MDM  EKG:EKG Interpretation  Date/Time:  Friday December 21 2019 09:29:15 EDT Ventricular Rate:  108 PR Interval:    QRS Duration: 82 QT Interval:  335 QTC Calculation: 449 R Axis:   91 Text Interpretation: Sinus tachycardia Borderline right axis deviation When compared with ECG of 05/23/2019, No significant change was found Confirmed by Dione Booze ([id number]) on 12/21/2019 11:25:05 PM    I have reviewed the labs performed to date as well as medications administered while in observation.  Recent changes in the last 24 hours include continues to require IM medications due to  intermittent aggression. Plan  Current plan is for Dr. Otelia Limes evaluated pt and recommended starting topimax 25mg  bid which was ordered as well as continuing depakote.  Pt has had normal labs without signs of infection and head CT neg.  Pt to go back to group home when he is no longer aggressive.    , MD 12/22/19 [id number]

## 2019-12-22 NOTE — ED Notes (Signed)
Per Dr Tamera Punt, [facility] - meds to be ordered.

## 2019-12-22 NOTE — ED Notes (Signed)
IVC paperwork rescinded - Copy faxed to [employer] - Copy sent to Medical Records - Original placed in folder for Magistrate.

## 2019-12-22 NOTE — Progress Notes (Signed)
Patient ID: Paul Hendricks, male   DOB: [birth year], 29 y.o.   MRN: [id number] Per previous EDP re-evaluation: [name] is a 29 y.o. male, seen on rounds today.  Pt initially presented to the ED for complaints of Aggressive Behavior Currently, the patient is in restraints due to being aggressive with the sitter and trying to damage the TV on the wall.  However restraints were removed by 8 AM and patient is sitting in the bed fidgety and slightly agitated intermittently vocalizing sounds but is nonverbal.  He did grab one of the sitters by the arm and caused mild harm but was also eating with assistance.  He has had no vomiting, fever, cough or signs of shortness of breath.  He has not had a bowel movement since arrival.  12-22-19: Upon this telepsych re-assess, Patient is observed lying down. He is on 1:1 supervision for safety for self/others. He is moving restlessly, swinging his arms, reaching out to things & grabbing things/people. He is not a good historian. He is currently yelling/screaming. Reports provided by the 1:1 sitter. "Patient has no train of thoughts requiring frequent/constant cuing, redirection/re/orientation. Patient gets frustrated easily, bites, screams & yells. Has received haldol & Ativan injections, made him worse. Remains on restraints. Group home willing to take patient back if he can be off the restraints".  Initiate Geodon 20 mg IM Q 8 hours prn for agitation. Continue routine mental health medications as already in progress.Marland Kitchen

## 2019-12-22 NOTE — Progress Notes (Signed)
CSW was alerted by RN CM that patient needs support in returning to the group home.   CSW reached out to group home director to determine if patient is able to return. Group home director confirmed patient can come back to the group home; that staff member Marilu Favre works with patient regularly and will be at the group home when patient returns. Group home director advised  they cannot pick patient up at discharge due to concerns for staff safety. CSW discussed transportation with RN and Patent examiner; per Patent examiner, they can transport patient back to the group home.   CSW updated attending provider MD Effie Shy on status. MD Effie Shy will notify when patient is ready for discharge.   Rosette Reveal, LCSW, LCAS Clinical Social Worker II Emergency Department, [facility] Transitions of Care Department, [facility] [phone]

## 2019-12-22 NOTE — ED Notes (Signed)
Sitter was heard yelling for help - came to assess, and pt was standing on stretcher w/ceiling tiles and air ventilation pulled down from ceiling. Hitting at staff, MD notified. Moved to room next door, mattress on floor and wires taken out of room

## 2019-12-22 NOTE — ED Notes (Signed)
Pt's IVC papers and folder are in purple

## 2019-12-22 NOTE — ED Notes (Signed)
Pt making loud noises  No speech

## 2019-12-22 NOTE — ED Provider Notes (Signed)
Emergency Medicine Observation Re-evaluation Note  Paul Hendricks is a 29 y.o. male, seen on rounds today.  Pt initially presented to the ED for complaints of Aggressive Behavior Currently, the patient is intermittently crying out, but remains redirectable.  Physical Exam  BP (!) 134/80 (BP Location: Left Arm)    Pulse 99    Temp 98 F (36.7 C) (Axillary)    Resp 16    SpO2 96%  Physical Exam Agitated, responsive to caregiver requests ED Course / MDM  EKG:EKG Interpretation  Date/Time:  Friday December 21 2019 09:29:15 EDT Ventricular Rate:  108 PR Interval:    QRS Duration: 82 QT Interval:  335 QTC Calculation: 449 R Axis:   91 Text Interpretation: Sinus tachycardia Borderline right axis deviation When compared with ECG of 05/23/2019, No significant change was found Confirmed by Dione Booze ([id number]) on 12/21/2019 11:25:05 PM  Clinical Course as of Dec 21 2040  Sat Dec 22, 2019  1211 Received 2 mg of Versed about 40 minutes ago, and has not become sedated from it yet.  Will give additional Versed.  Group home is ready to take him back, and he will be transferred by law enforcement, after he becomes a bit more controllable.  IVC has been rescinded by me.   [EW]    Clinical Course User Index [EW] Mancel Bale, MD   I have reviewed the labs performed to date as well as medications administered while in observation.  Recent changes in the last 24 hours include continued agitation.  Patient destroyed ceiling tiles of the room and had to be moved to another room. Plan  Current plan is for discharge back to group home for ongoing management. Patient is not under full IVC at this time.   Mancel Bale, MD 12/22/19 2043

## 2019-12-23 ENCOUNTER — Emergency Department (HOSPITAL_COMMUNITY): Payer: Medicaid Other

## 2019-12-23 ENCOUNTER — Emergency Department (HOSPITAL_COMMUNITY)
Admission: EM | Admit: 2019-12-23 | Discharge: 2019-12-26 | Disposition: A | Discharge status: Home / Self Care | Payer: Medicaid Other | Source: Home / Self Care | Attending: Emergency Medicine | Admitting: Emergency Medicine

## 2019-12-23 ENCOUNTER — Encounter (HOSPITAL_COMMUNITY): Payer: Self-pay

## 2019-12-23 ENCOUNTER — Other Ambulatory Visit: Payer: Self-pay

## 2019-12-23 ENCOUNTER — Emergency Department (HOSPITAL_COMMUNITY)
Admission: EM | Admit: 2019-12-23 | Discharge: 2019-12-23 | Disposition: A | Discharge status: Home / Self Care | Payer: Medicaid Other | Attending: Emergency Medicine | Admitting: Emergency Medicine

## 2019-12-23 DIAGNOSIS — G40909 Epilepsy, unspecified, not intractable, without status epilepticus: Secondary | ICD-10-CM | POA: Insufficient documentation

## 2019-12-23 DIAGNOSIS — R4689 Other symptoms and signs involving appearance and behavior: Secondary | ICD-10-CM

## 2019-12-23 DIAGNOSIS — F840 Autistic disorder: Secondary | ICD-10-CM | POA: Insufficient documentation

## 2019-12-23 DIAGNOSIS — R456 Violent behavior: Secondary | ICD-10-CM | POA: Insufficient documentation

## 2019-12-23 DIAGNOSIS — F319 Bipolar disorder, unspecified: Secondary | ICD-10-CM | POA: Diagnosis present

## 2019-12-23 DIAGNOSIS — Z79899 Other long term (current) drug therapy: Secondary | ICD-10-CM | POA: Insufficient documentation

## 2019-12-23 DIAGNOSIS — M7989 Other specified soft tissue disorders: Secondary | ICD-10-CM

## 2019-12-23 DIAGNOSIS — R791 Abnormal coagulation profile: Secondary | ICD-10-CM | POA: Insufficient documentation

## 2019-12-23 LAB — COMPREHENSIVE METABOLIC PANEL
ALT: 67 U/L — ABNORMAL HIGH (ref 0–44)
AST: 111 U/L — ABNORMAL HIGH (ref 15–41)
Albumin: 4.3 g/dL (ref 3.5–5.0)
Alkaline Phosphatase: 42 U/L (ref 38–126)
Anion gap: 11 (ref 5–15)
BUN: 19 mg/dL (ref 6–20)
CO2: 25 mmol/L (ref 22–32)
Calcium: 9.1 mg/dL (ref 8.9–10.3)
Chloride: 108 mmol/L (ref 98–111)
Creatinine, Ser: 1.13 mg/dL (ref 0.61–1.24)
GFR calc Af Amer: >60 mL/min (ref >60)
GFR calc non Af Amer: >60 mL/min (ref >60)
Glucose, Bld: 92 mg/dL (ref 70–99)
Potassium: 4 mmol/L (ref 3.5–5.1)
Sodium: 144 mmol/L (ref 135–145)
Total Bilirubin: 0.8 mg/dL (ref 0.3–1.2)
Total Protein: 7.4 g/dL (ref 6.5–8.1)

## 2019-12-23 LAB — CBC WITH DIFFERENTIAL/PLATELET
Abs Immature Granulocytes: 0.03 10*3/uL (ref 0.00–0.07)
Basophils Absolute: 0 10*3/uL (ref 0.0–0.1)
Basophils Relative: 0 %
Eosinophils Absolute: 0.1 10*3/uL (ref 0.0–0.5)
Eosinophils Relative: 2 %
HCT: 35.6 % — ABNORMAL LOW (ref 39.0–52.0)
Hemoglobin: 11.7 g/dL — ABNORMAL LOW (ref 13.0–17.0)
Immature Granulocytes: 0 %
Lymphocytes Relative: 38 %
Lymphs Abs: 3.1 10*3/uL (ref 0.7–4.0)
MCH: 30.9 pg (ref 26.0–34.0)
MCHC: 32.9 g/dL (ref 30.0–36.0)
MCV: 93.9 fL (ref 80.0–100.0)
Monocytes Absolute: 1 10*3/uL (ref 0.1–1.0)
Monocytes Relative: 12 %
Neutro Abs: 3.9 10*3/uL (ref 1.7–7.7)
Neutrophils Relative %: 48 %
Platelets: 187 10*3/uL (ref 150–400)
RBC: 3.79 MIL/uL — ABNORMAL LOW (ref 4.22–5.81)
RDW: 11.7 % (ref 11.5–15.5)
WBC: 8.1 10*3/uL (ref 4.0–10.5)
nRBC: 0 % (ref 0.0–0.2)

## 2019-12-23 LAB — PROTIME-INR
INR: 1.1 (ref 0.8–1.2)
Prothrombin Time: 13.5 seconds (ref 11.4–15.2)

## 2019-12-23 LAB — LIPASE, BLOOD: Lipase: 27 U/L (ref 11–51)

## 2019-12-23 LAB — APTT: aPTT: 28 seconds (ref 24–36)

## 2019-12-23 LAB — LACTIC ACID, PLASMA: Lactic Acid, Venous: 0.8 mmol/L (ref 0.5–1.9)

## 2019-12-23 MED ORDER — DIPHENHYDRAMINE HCL 50 MG/ML IJ SOLN
50.0000 mg | Freq: Once | INTRAMUSCULAR | Status: AC
  Administered 2019-12-23: 50 mg via INTRAMUSCULAR
  Filled 2019-12-23: qty 1

## 2019-12-23 MED ORDER — CLONAZEPAM 1 MG PO TABS: 1.0000 mg | ORAL_TABLET | ORAL | Status: DC

## 2019-12-23 MED ORDER — MELATONIN 5 MG PO TABS
10.0000 mg | ORAL_TABLET | Freq: Every day | ORAL | Status: DC
  Administered 2019-12-23 – 2019-12-26 (×3): 10 mg via ORAL
  Filled 2019-12-23 (×3): qty 2

## 2019-12-23 MED ORDER — MEGESTROL ACETATE 40 MG PO TABS
40.0000 mg | ORAL_TABLET | Freq: Three times a day (TID) | ORAL | Status: DC
  Administered 2019-12-24 (×2): 40 mg via ORAL
  Filled 2019-12-23 (×10): qty 1

## 2019-12-23 MED ORDER — ATORVASTATIN CALCIUM 20 MG PO TABS
20.0000 mg | ORAL_TABLET | Freq: Every day | ORAL | Status: DC
  Filled 2019-12-23 (×4): qty 1

## 2019-12-23 MED ORDER — MIDAZOLAM HCL 5 MG/5ML IJ SOLN
5.0000 mg | Freq: Once | INTRAMUSCULAR | Status: DC
  Filled 2019-12-23: qty 5

## 2019-12-23 MED ORDER — GUANFACINE HCL 1 MG PO TABS
2.0000 mg | ORAL_TABLET | Freq: Two times a day (BID) | ORAL | Status: DC
  Administered 2019-12-24 – 2019-12-26 (×3): 2 mg via ORAL
  Filled 2019-12-23 (×7): qty 2

## 2019-12-23 MED ORDER — LORAZEPAM 2 MG/ML IJ SOLN
2.0000 mg | Freq: Once | INTRAMUSCULAR | Status: AC
  Administered 2019-12-23: 2 mg via INTRAMUSCULAR
  Filled 2019-12-23: qty 1

## 2019-12-23 MED ORDER — HALOPERIDOL LACTATE 5 MG/ML IJ SOLN
5.0000 mg | Freq: Once | INTRAMUSCULAR | Status: AC
  Administered 2019-12-24: 5 mg via INTRAMUSCULAR
  Filled 2019-12-23: qty 1

## 2019-12-23 MED ORDER — TOPIRAMATE 25 MG PO TABS
25.0000 mg | ORAL_TABLET | Freq: Two times a day (BID) | ORAL | Status: DC
  Administered 2019-12-23 – 2019-12-24 (×2): 25 mg via ORAL
  Filled 2019-12-23 (×6): qty 1

## 2019-12-23 MED ORDER — CLONAZEPAM 1 MG PO TABS
1.0000 mg | ORAL_TABLET | Freq: Three times a day (TID) | ORAL | Status: DC
  Administered 2019-12-23 – 2019-12-25 (×4): 1 mg via ORAL
  Filled 2019-12-23 (×6): qty 1

## 2019-12-23 MED ORDER — MIDAZOLAM HCL 5 MG/ML IJ SOLN
5.0000 mg | Freq: Once | INTRAMUSCULAR | Status: AC
  Administered 2019-12-23: 5 mg via INTRAMUSCULAR
  Filled 2019-12-23: qty 1

## 2019-12-23 MED ORDER — DIPHENHYDRAMINE HCL 50 MG/ML IJ SOLN
50.0000 mg | Freq: Once | INTRAMUSCULAR | Status: AC
  Administered 2019-12-24: 50 mg via INTRAMUSCULAR
  Filled 2019-12-23: qty 1

## 2019-12-23 MED ORDER — TOPIRAMATE 25 MG PO TABS
25.0000 mg | ORAL_TABLET | Freq: Two times a day (BID) | ORAL | 0 refills | Status: DC
Start: 2019-12-23 — End: 2020-01-07

## 2019-12-23 MED ORDER — MIDAZOLAM HCL (PF) 5 MG/ML IJ SOLN
10.0000 mg | Freq: Once | INTRAMUSCULAR | Status: AC
  Administered 2019-12-23: 10 mg via INTRAMUSCULAR
  Filled 2019-12-23: qty 2

## 2019-12-23 MED ORDER — KETOROLAC TROMETHAMINE 15 MG/ML IJ SOLN
15.0000 mg | Freq: Once | INTRAMUSCULAR | Status: DC
  Filled 2019-12-23: qty 1

## 2019-12-23 MED ORDER — OLANZAPINE 10 MG IM SOLR
10.0000 mg | Freq: Once | INTRAMUSCULAR | Status: AC
  Administered 2019-12-23: 10 mg via INTRAMUSCULAR
  Filled 2019-12-23: qty 10

## 2019-12-23 MED ORDER — DIVALPROEX SODIUM ER 500 MG PO TB24
1500.0000 mg | ORAL_TABLET | Freq: Every day | ORAL | Status: DC
  Administered 2019-12-23 – 2019-12-25 (×2): 1500 mg via ORAL
  Filled 2019-12-23 (×4): qty 3

## 2019-12-23 MED ORDER — TRAZODONE HCL 100 MG PO TABS
100.0000 mg | ORAL_TABLET | Freq: Two times a day (BID) | ORAL | Status: DC
  Administered 2019-12-23 – 2019-12-24 (×2): 100 mg via ORAL
  Filled 2019-12-23 (×4): qty 1

## 2019-12-23 MED ORDER — DIVALPROEX SODIUM ER 500 MG PO TB24
500.0000 mg | ORAL_TABLET | Freq: Every day | ORAL | Status: DC
  Filled 2019-12-23 (×2): qty 1

## 2019-12-23 MED ORDER — ACETAMINOPHEN 500 MG PO TABS
1000.0000 mg | ORAL_TABLET | Freq: Once | ORAL | Status: AC
  Administered 2019-12-23: 1000 mg via ORAL
  Filled 2019-12-23: qty 2

## 2019-12-23 MED ORDER — ARIPIPRAZOLE 10 MG PO TABS
10.0000 mg | ORAL_TABLET | Freq: Two times a day (BID) | ORAL | Status: DC
  Administered 2019-12-23 – 2019-12-25 (×4): 10 mg via ORAL
  Filled 2019-12-23 (×4): qty 1

## 2019-12-23 MED ORDER — LORAZEPAM 2 MG/ML IJ SOLN
2.0000 mg | Freq: Once | INTRAMUSCULAR | Status: AC
  Administered 2019-12-24: 2 mg via INTRAVENOUS
  Filled 2019-12-23: qty 1

## 2019-12-23 MED ORDER — DIVALPROEX SODIUM ER 500 MG PO TB24: 500.0000 mg | ORAL_TABLET | ORAL | Status: DC

## 2019-12-23 MED ORDER — ARIPIPRAZOLE 10 MG PO TABS: 10.0000 mg | ORAL_TABLET | ORAL | Status: DC

## 2019-12-23 MED ORDER — AMOXICILLIN-POT CLAVULANATE 875-125 MG PO TABS
1.0000 | ORAL_TABLET | Freq: Two times a day (BID) | ORAL | Status: DC
  Administered 2019-12-23 – 2019-12-25 (×3): 1 via ORAL
  Filled 2019-12-23 (×5): qty 1

## 2019-12-23 NOTE — ED Notes (Signed)
Pt currently sleeping on the doors from the bathroom. Attempts made to prompt pt to get into bed with no success. Pt given blanket, and pillow on the ground. Will try to direct pt to bed if he gets up later.

## 2019-12-23 NOTE — ED Provider Notes (Signed)
[facility]ENC[facility] DEPT [facility] Emergency Department Provider Note MRN:  [id number]  Arrival date & time: 12/23/19     Chief Complaint   Seizures and Mental Health Problem   History of Present Illness   Paul Hendricks is a 29 y.o. year-old male with a history of autism disorder, seizure disorder presenting to the ED with chief complaint of seizure.  2 seizure-like episodes today at group home.  Also with similar behavior with hitting head against wall recently, behavioral disturbance.  Patient is with severe cognitive impairment and unable to provide further history.  Unable to reach group home or patient's mother by phone.  I was unable to obtain an accurate HPI, PMH, or ROS due to the patient's autism spectrum disorder.  Level 5 caveat.  Review of Systems  A complete 10 system review of systems was obtained and all systems are negative except as noted in the HPI and PMH.   Patient's Health History    Past Medical History:  Diagnosis Date  . Anxiety   . Autism disorder    WITH BEHAVIORAL ISSUES  . Seizures (HCC)    LAST SEIZURE 5-6 YRS AGO    Past Surgical History:  Procedure Laterality Date  . LAPAROSCOPIC APPENDECTOMY N/A 05/23/2019   Procedure: APPENDECTOMY LAPAROSCOPIC;  Surgeon: Kinsinger, De Blanch, MD;  Location: [facility] OR;  Service: General;  Laterality: N/A;  . TOOTH EXTRACTION N/A 12/23/2014   Procedure: EXTRACTION  OF TEETH 3,29,51,88;  Surgeon: Lincoln Brigham, DDS;  Location: [facility] OR;  Service: Oral Surgery;  Laterality: N/A;    No family history on file.  Social History   Socioeconomic History  . Marital status: Single    Spouse name: Not on file  . Number of children: Not on file  . Years of education: Not on file  . Highest education level: Not on file  Occupational History  . Not on file  Tobacco Use  . Smoking status: Never Smoker  . Smokeless tobacco: Never Used  Substance and Sexual Activity  . Alcohol use: No    Alcohol/week: 0.0 standard  drinks  . Drug use: No  . Sexual activity: Not on file  Other Topics Concern  . Not on file  Social History Narrative  . Not on file   Social Determinants of Health   Financial Resource Strain:   . Difficulty of Paying Living Expenses:   Food Insecurity:   . Worried About Programme researcher, broadcasting/film/video in the Last Year:   . Barista in the Last Year:   Transportation Needs:   . Freight forwarder (Medical):   Marland Kitchen Lack of Transportation (Non-Medical):   Physical Activity:   . Days of Exercise per Week:   . Minutes of Exercise per Session:   Stress:   . Feeling of Stress :   Social Connections:   . Frequency of Communication with Friends and Family:   . Frequency of Social Gatherings with Friends and Family:   . Attends Religious Services:   . Active Member of Clubs or Organizations:   . Attends Banker Meetings:   Marland Kitchen Marital Status:   Intimate Partner Violence:   . Fear of Current or Ex-Partner:   . Emotionally Abused:   Marland Kitchen Physically Abused:   . Sexually Abused:      Physical Exam   Vitals:   12/23/19 1147 12/23/19 1232  BP: 111/67 (!) 121/88  Pulse: 66 99  Resp: 19   SpO2: 97% 98%  CONSTITUTIONAL: Chronically ill-appearing, NAD NEURO: Intermittent loud screeching sounds, pacing the room, no focal neurological deficits EYES:  eyes equal and reactive ENT/NECK:  no LAD, no JVD CARDIO: Regular rate, well-perfused, normal S1 and S2 PULM:  CTAB no wheezing or rhonchi GI/GU:  normal bowel sounds, non-distended, non-tender MSK/SPINE:  No gross deformities, no edema SKIN: Mild bruising to the forehead PSYCH:  Appropriate speech and behavior  *Additional and/or pertinent findings included in MDM below  Diagnostic and Interventional Summary    EKG Interpretation  Date/Time:    Ventricular Rate:    PR Interval:    QRS Duration:   QT Interval:    QTC Calculation:   R Axis:     Text Interpretation:        Labs Reviewed - No data to display  CT  Head Wo Contrast  Final Result      Medications  midazolam PF (VERSED) injection 10 mg (10 mg Intramuscular Given 12/23/19 1042)  midazolam (VERSED) injection 5 mg (5 mg Intramuscular Given 12/23/19 1309)     Procedures  /  Critical Care Procedures  ED Course and Medical Decision Making  I have reviewed the triage vital signs, the nursing notes, and pertinent available records from the EMR.  Listed above are laboratory and imaging tests that I personally ordered, reviewed, and interpreted and then considered in my medical decision making (see below for details).      Patient has had a recent extended ED stay for this similar behavioral disturbance, for which she was evaluated by psychiatry and they felt he was appropriate for discharge back to his group home.  He was also evaluated by neurology for his seizure disorder and started on some medications.  Patient reportedly had some seizure-like behavior today.  Labs from 2 days ago are reassuring, nothing to suggest a secondary cause of seizure.  Normal vital signs, no focal neurological deficits.  Unfortunately seems to be at his baseline, which is very challenging.  Provided with intramuscular midazolam to control symptoms, CT head does not reveal any bleeding or signs that trauma has caused his seizures.  Appropriate for discharge back to group home for continued work on his behavior.    Elmer Sow. Pilar Plate, MD [facility] Emergency Medicine [facility] [email or web address]  Final Clinical Impressions(s) / ED Diagnoses     ICD-10-CM   1. Seizure disorder (HCC)  G40.909   2. Autism disorder  F84.0     ED Discharge Orders    None       Discharge Instructions Discussed with and Provided to Patient:     Discharge Instructions     You were evaluated in the Emergency Department and after careful evaluation, we did not find any emergent condition requiring admission or further testing in the hospital.  Please return to  the Emergency Department if you experience any worsening of your condition.  Thank you for allowing [place] to be a part of your care.       Sabas Sous, MD 12/23/19 1407

## 2019-12-23 NOTE — Discharge Instructions (Signed)
You were evaluated in the Emergency Department and after careful evaluation, we did not find any emergent condition requiring admission or further testing in the hospital. ? ? ?Please return to the Emergency Department if you experience any worsening of your condition.   Thank you for allowing us to be a part of your care. ?

## 2019-12-23 NOTE — BH Assessment (Signed)
Patient was sent back to [facility] after having been returned to the group home yesterday (07/31).  Group home had concerns about whether patient was actually medically clear because of seizures that he had today.    Clinician spoke with Dr. Adela Lank about whether patient needed to be seen by TTS since he had been sent back to [facility] yesterday and documentation currently is focused on seizure activity.  Dr. Adela Lank said that it was his understanding that patient was back at the ED due to aggressive behavior.    Clinician called [facility] at [phone] and there was no answer.  Clinician called Bernita Buffy manager, at [phone] and left a HIPPA compliant message.  Clinician spoke with Nicolette Bang who reviewed notes with clinician.  He recommended pt be observed overnight and be seen by psychiatry in the morning.

## 2019-12-23 NOTE — ED Notes (Signed)
Unable to acquire vital signs due to Pt behavior.

## 2019-12-23 NOTE — Progress Notes (Signed)
12/23/2019  1731  Labs drawn. Purple, light green, dark green, blue, gray tubes and one set of blood cultures drawn and sent to main lab.

## 2019-12-23 NOTE — ED Notes (Signed)
He was assisted out of bed as he was quite agitated. Dr. Pilar Plate orders medication, which was given. We assist  Him back to bed and, as I write this, he is calming down and in no distress.

## 2019-12-23 NOTE — ED Notes (Signed)
He remains calm. I have just notified [facility] for transport back to his residence. I attempt to call both numbers listed on his facesheet with no answer obtained.

## 2019-12-23 NOTE — Social Work (Signed)
CSW reached group home owner [name] @ [phone]. [name] states that she is not abandoning Pt, but that she refusing to accept his return to group home due to seizure activity. Per [name] Pt had 1 large and 2 small seizures this morning followed by LOC.  [name] states that she does not feel that Pt is medically safe to be in group home.  TOC team will reach out to mother as well as [facility] APS on 8/2.  Shella Spearing [name] LCSWA Transitions of Care  Clinical Social Worker  [facility] Emergency Departments  [phone]

## 2019-12-23 NOTE — ED Notes (Signed)
He is much more quiet and calm. He is drowsy and in no distress.

## 2019-12-23 NOTE — ED Notes (Signed)
We move him to TCU at this time. Report given to [facility], Charity fundraiser.

## 2019-12-23 NOTE — Progress Notes (Signed)
12/23/2019 1626  EMS brought patient back stating that group home refused to take patient.  Spoke with Ricard Dillon and she has spoken with the owner of the group home and per sheila the owner states that patient had a seizure earlier this morning and she does not feel as though he has been properly medically cleared to return to the group home. SW states she will call APS tomorrow.
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
## 2019-12-23 NOTE — ED Triage Notes (Signed)
He comes to [place] from a group home. Staff there observed pt. To have a "seizure" this morning; and he is agitated and exhibiting head-banging behavior. He is extremely agitated upon arrival to E.D. He is ambulatory and is loud and speaks in syllables, most of which are not words, which is his baseline.

## 2019-12-24 ENCOUNTER — Encounter (HOSPITAL_COMMUNITY): Payer: Self-pay | Admitting: Registered Nurse

## 2019-12-24 MED ORDER — ZIPRASIDONE MESYLATE 20 MG IM SOLR
20.0000 mg | Freq: Once | INTRAMUSCULAR | Status: AC
  Administered 2019-12-24: 20 mg via INTRAMUSCULAR
  Filled 2019-12-24: qty 20

## 2019-12-24 MED ORDER — STERILE WATER FOR INJECTION IJ SOLN
INTRAMUSCULAR | Status: AC
  Filled 2019-12-24: qty 10

## 2019-12-24 NOTE — ED Notes (Signed)
Pt is laying on the floor covered up with his blanket, Pt seems to be resting at this time

## 2019-12-24 NOTE — Social Work (Signed)
@  9:19am TOC CSW attempted to contact [name] Okonji ([facility] owner) at [phone] and [phone].  CSW left HIPPA compliant message with my contact information.  @9 :23am TOC CSW attempted to contact , pts mom at [phone].  Ms. [phone] asked that all contact be made to [facility] (owner of [facility]) or Dr. [facility] (pts dad) [phone].  Unless it's an urgent situation, due to they manage all of his mental and health care needs.  @9 :30am  TOC CSW attempted to contact Dr. [phone] (pts dad) [phone].  CSW left HIPPA compliant message with my contact information.  @10 :14am TOC CSW received a call from Dr. Corinna Gab regarding vm message.  CSW updated Dr. [phone] of pts current status.  That ED doctor stated he would order a Depakote Level to assist with assessment of pt.  ED doctor also stated that he would advise pts legal guardians to refer to his primary doctors for his ongoing care to assist with stabilization of pt.   @10 :28am TOC CSW spoke with at [phone].  She stated that she agreed with ED doctors advice.  [name] also stated that she was going to court today to IVC pt and they would transport him to [facility], [address], [place], [phone], which was advised by [place].  [name] will call to update CSW about her progress with this plan.  CSW will continue to follow for dc needs.  Adriane Guglielmo Tarpley-Carter, [name], LCSW-A                  [address] ED Transitions of CareClinical Social Worker [email or web address] [phone]
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
## 2019-12-24 NOTE — Progress Notes (Addendum)
CSW spoke to the pt's group home owner Paul Hendricks who states that she is willing to take the pt back as soon as she feels it is safe, that she has been attempting to facilitate a safe D/C, but wants his psychiatrist to provide a PRN for the pt and would like to have his psychiatrist see the pt's medications which he was given in the hospital so the pt's psychiatrist can prepare pt's medications to be able to facilitate a safe reentry back to the pt's group home so that he doesn't have to return to the hospital for behavioral reasons after D/C.  CSW was asked to send pt's labs and MAR to the group home by SECURE/HIPPA-compliant means so she can screen pt's case with his psychiatrist prior to picking up the pt.  Pt's group home owner states she will screen pt's case with his psychiatrist first thing in the morning and assess whether the group home is fully prepared (with proper meds, PRN meds, seizure meds seen as effective) to receive pt safely.  Paul Hendricks of the group home's email is:  [email or web address]    CSW contacted pt's Advanced Care Supervisor (ACS) to receive permission to send labs and [facility] and med list to the pt's group home owner for her consideration prior to D/C.  6:21 PM Permission was granted by the ACS to send info by [facility] HIPPA-compliant email.  Pt's group home owner Paul Hendricks states that if the psychiatrist and neurologist are not able to see the pt's group home owner Paul Hendricks and the pt's and/or discuss the pt's case tomorrow 12/25/19 then Paul Hendricks has a "Plan B" and they agree to pick up the pt on 12/25/19.  Paul Hendricks states she would like just one day to talk to pt's providers in the community to staff pt's case.  Paul Hendricks states that it is a privilege to serve and care for Paul Hendricks and only wants what is best for the pt and voiced understanding that [facility] will call her tomorrow to be updated on the pt's planned D/C plan which Paul Hendricks states can happen tomorrow (12/25/19).  CSW offered the CSW's work cell for  further questions and concerns regarding the pt going forward.  Paul Hendricks was Adult nurse and thanked the CSW.  Plan: TOC CSW/RN CM will call pt's group home owner on 12/25/19 to see when she can pick the pt up and to send her what she needs to demonstrate pt is now calm, and not prone to seizure activity in the ED prior to D/C if appropriate.  CSW will continue to follow for D/C needs.  6:26 PM Information sent to pt's group home owner.  Dorothe Pea. Dannya Pitkin  [name], LCSW, LCAS, CCS Transitions of Care Clinical Social Worker Care Coordination Department Ph: [phone]

## 2019-12-24 NOTE — Consult Note (Signed)
  Paul Hendricks, 29 y.o., male patient seen via tele psych by this provider, consulted with Dr. Lucianne Muss; and chart reviewed on 12/24/19.  On evaluation Paul Hendricks is pacing hall.  When asked his name patient made a hollering noise.  Patient is non-verbal with autism.  He has been banging his head which is a normal characteristic for some autistic patients and those with severe intellectual disabilities.  Asked nursing if patient wore a protective helmet at group home; they were unsure but would check.  When telling the patient good bye he wave both hands in the air and made a hollering noise.  Patients agitation may be related to being in an unfamiliar environment and unable to communicate his desires properly and staff not familiar with patient and not understanding.    Patient is unable to participate in assessment related to being non-verbal and sever intellectual disability.  After chart review group home wanted to have a Depakote level checked.  Last know Valproic Acid level was done 4 days ago (12/20/19) with level of 83.  Patient presented back to the ED after being discharged back to group home for having 2 seizures.  Patient is also taking Augmentin.    Patient home psychotropic medications have been restarted.  Unsure if Depakote is for mood stabilization or both but will recommend reordering to assure that the levels continue to be in therapeutic range.    Recommendation:  Reorder Valproic Acid Level to assure levels continue to be at therapeutic levels  Disposition:  Psychiatrically cleared No evidence of imminent risk to self or others at present.   Patient does not meet criteria for psychiatric inpatient admission. Supportive therapy provided about ongoing stressors. Discussed crisis plan, support from social network, calling 911, coming to the Emergency Department, and calling Suicide Hotline.   Patient to follow up with current outpatient psychiatric provider

## 2019-12-24 NOTE — ED Notes (Signed)
Patient has been monitored by this RN since 0300. Patient has had no seizure activity but continues to scream, beat on the walls and doors and walk around. Patient has been given medications to help him sleep, but has not had restful sleep. Patient is non-verbal and difficult to redirect at times when agitated. Patient has ripped shower curtains from the bathroom and tore down doors.

## 2019-12-24 NOTE — ED Provider Notes (Addendum)
Emergency Medicine Observation Re-evaluation Note  Paul Hendricks is a 29 y.o. male, seen on rounds today.  Pt initially presented to the ED for complaints of Seizures Currently, the patient is yelling out. Not cooperative  Physical Exam  BP 116/83 (BP Location: Left Arm)   Pulse 87   Resp 20   SpO2 96%  Physical Exam Yelling, nonverbal autistic patient.   ED Course / MDM  EKG:EKG Interpretation  Date/Time:  Sunday December 23 2019 23:37:23 EDT Ventricular Rate:  93 PR Interval:  142 QRS Duration: 70 QT Interval:  360 QTC Calculation: 447 R Axis:   66 Text Interpretation: Normal sinus rhythm Normal ECG No significant change since last tracing Confirmed by Richardean Canal [phone]) on 12/23/2019 11:50:58 PM    I have reviewed the labs performed to date as well as medications administered while in observation.  Recent changes in the last 24 hours include Yelling, but not self-injurious. . Plan  Current plan is for Group home placement. . Patient is not under full IVC at this time.   10:24 AM Spoke with Social work who has been in contact with Group Home staff. They are concerned about his continued seizures. He was recently at [facility] ED for similar, neurology consulted then and medications changed. He has not had any seizures in the ED. Psych has seen him and cleared him from their stand point. He does not need any additional ED care, can be discharged back to Group Home if they will agree to take him back with his new medication regimen.    Pollyann Savoy, MD 12/24/19 [phone]

## 2019-12-24 NOTE — ED Notes (Signed)
Per Social Work group home states this is not his usual behavior.   They want a neuro exam and depakote level checked.  Social Work is talking with EDP about this.

## 2019-12-24 NOTE — ED Notes (Signed)
While out in the hallway pt swung at this tech and also [employer] that was standing guard. Pt was escorted back into the quiet room for his own safety. Pt still biting at his hands and banging his head continuously. Pt is very agitated

## 2019-12-24 NOTE — BH Assessment (Signed)
BHH Assessment Progress Note  At the request of Ricquita Tarpley-Carter, CSW, this Clinical research associate called the owner of [facility] group home where pt resides.  Call to [facility] [phone]) was placed at 15:07.  I explained to her that pt has been psychiatrically and medically cleared and that he is ready for discharged from [facility]. He is advised to follow up with his outpatient neurology and behavioral health providers.  I informed her that social work will be following up with her to facilitate his return to [facility].  Ricquita has been updated.  Doylene Canning, [place] Behavioral Health Coordinator [phone]

## 2019-12-24 NOTE — Discharge Instructions (Signed)
For your behavioral health needs, you are advised to continue treatment with your regular outpatient provider. °

## 2019-12-24 NOTE — ED Notes (Signed)
Pt is still banging his head on the walls inside the seclusion room. Pt is restless and agitated.

## 2019-12-24 NOTE — ED Notes (Signed)
[name] from group home called to check on patient.  She told me that she wants him back but is worried about his seizures.  I have not seen any seizure activity today.  She also stated "yall need to be patient with him and with her"

## 2019-12-24 NOTE — ED Notes (Signed)
Pt is asleep on the floor, staff uncovered pt's head and opened the doorto the seclusion room. Pt continues to stay resting at this time

## 2019-12-24 NOTE — ED Notes (Signed)
Pt sat in the floor with this tech and ate all of his breakfast except the grits which contained his meds. Pt drank his juice along with part of his milk

## 2019-12-24 NOTE — ED Notes (Signed)
Pt has slept off and on for about increments since 12:15pm. Pt paces the halls and in and out of rooms. Pt has been offered his lunch several times. Pt is currently not a harm to himself.

## 2019-12-24 NOTE — Social Work (Addendum)
TOC CSW spoke with [name] ([facility] Owner) [phone].  She has completed the IVC process and wants pt transported to [facility] per [facility].    Pt did wear a helmet about 5 years ago, but will not wear one anymore.  CSW will continue to follow for dc needs.  Lillianne Eick Tarpley-Carter, [name], LCSW-A                  [name] ED Transitions of CareClinical Social Worker [email or web address] [phone]

## 2019-12-24 NOTE — ED Notes (Signed)
Patient began banging his head while inside the quiet room.  Security present and patient was let out and mouth care was done and water was given.  He refused to take his medications.  The bruise on his head is about tennis ball size.   He is pacing and hollering.

## 2019-12-24 NOTE — BH Assessment (Signed)
BHH Assessment Progress Note  Per Shuvon Rankin, NP, this voluntary pt does not require psychiatric hospitalization at this time.  Pt is psychiatrically cleared.  Discharge instructions advise pt to continue treatment with his regular outpatient provider.  A social work consult has been ordered to facilitate pt's return to the community.  EDP Susy Frizzle, MD and pt's nurse, Kendal Hymen, have been notified.  Doylene Canning, MA Triage Specialist [phone]

## 2019-12-24 NOTE — ED Notes (Signed)
Pt laid down in his room in attempt to rest after receiving injections. Pt began screaming an yelling but still in bed.

## 2019-12-24 NOTE — ED Notes (Signed)
Pt up walking around in hallway, banging his head against office window an back to his room. Pt attempts to lay down for a few then back up screaming an yelling.

## 2019-12-24 NOTE — ED Notes (Signed)
Pt was banging his head on the glass in front of nursing station.  Unable to redirect him and there is a big bruise on his forehead.   We will put patient in quiet room for safety.

## 2019-12-25 MED ORDER — TRAZODONE HCL 100 MG PO TABS: 100.0000 mg | ORAL_TABLET | Freq: Every day | ORAL | Status: DC

## 2019-12-25 MED ORDER — HALOPERIDOL LACTATE 5 MG/ML IJ SOLN
5.0000 mg | Freq: Once | INTRAMUSCULAR | Status: DC
  Filled 2019-12-25: qty 1

## 2019-12-25 MED ORDER — BENZTROPINE MESYLATE 1 MG/ML IJ SOLN
0.5000 mg | Freq: Once | INTRAMUSCULAR | Status: DC
  Filled 2019-12-25: qty 2

## 2019-12-25 NOTE — Progress Notes (Signed)
CSW received a call from pt's group home owner [name] who states her psychiatrist has provided her with PRN geodone and she wants to see if pt has already been given geodone today to make sure pt is not given too much.  Pt's neurologist per Miss Okey Dupre is going to have seizure meds for the pt sent to the pt's house tonight.  Miss Okey Dupre asked that pt be allowed to come to the group home in the morning and the CSW explained pt was up for D/C today and as such pt must go today.  Miss Okey Dupre was given 24 hours notice yesterday, and had agreed to allow the pt arrive today.  Mis [name] then agreed to allow the pt to come today and asked that D/C be facilitated as soon as possible and stated she felt it safe for pt   CSW agreed to facilitate transport and update pt's RN/EDP.  CSW will continue to follow for D/C needs.  Dorothe Pea. Sena Hoopingarner  [name], LCSW, LCAS, CCS Transitions of Care Clinical Social Worker Care Coordination Department Ph: [phone]

## 2019-12-25 NOTE — ED Notes (Signed)
Pt lying on floor in Isolation rm covered with sheet, not currently aggressive or self-injurious.

## 2019-12-25 NOTE — ED Provider Notes (Signed)
Patient is a 29 year old male with a history of autism who lives in a group home.  He has been psychiatrically cleared and was waiting to go back to the group home.  He has been intermittently agitated.  This afternoon he had some increased agitation and was put in isolation room.  I initially ordered some Ativan IM but he had calm down prior to receiving this.  They did call me back several hours later as the patient was banging his head on the door.  He has what appears to be an abrasion to his frontal scalp area.  There is no active bleeding.  He is ambulating and relatively calm currently.  He does not have any ataxia.  He was reported to be at his baseline mental status.  I do not feel that he needs any imaging at this point.  He does not appear to have any other injuries.  I did speak with the psychiatry team and they did say that he is medically cleared and his behavioral agitation is likely from him being outside of his normal environment.  They recommended that if he does get more agitated, the preferential medication use would be Haldol versus other antipsychotics.  They also added trazodone to take at night.  They felt otherwise that his behavior would likely improve once he gets back to his group home.  He will likely go back to his group home tomorrow.   Rolan Bucco, MD 12/25/19 2031

## 2019-12-25 NOTE — ED Notes (Signed)
Unable to obtain vital signs at the moment. Pt is pacing around the room and is unable to sit still.

## 2019-12-25 NOTE — ED Notes (Signed)
Pt was hitting the Tv box me and the security try to tell him to stop but he try to hit me

## 2019-12-25 NOTE — ED Notes (Signed)
Pt was given breakfast tray 

## 2019-12-25 NOTE — ED Notes (Signed)
Patient is refusing to have temp and BP taken

## 2019-12-25 NOTE — Social Work (Signed)
TOC CSW received a call from [facility] ([facility] Owner) [phone].  [name] wanted clarity on the medications that pt was given in the hospital.  She also gave an update on her ability to make contact with psychiatrist and neurologist in regards to pt.    [name] has been given parental rights by pts parents Adria Dill (pts mom) [phone] and Dr. Corinna Gab (pts dad) [phone])  to receive any information needed to care for pt (HIPPA-compliant).  [name] is also on the emergency contact list for pt.   CSW will continue to follow for dc needs.  Deona Novitski Tarpley-Carter, [name], LCSW-A                  [name] ED Transitions of Care Clinical Social Worker [email or web address] [phone]

## 2019-12-25 NOTE — ED Notes (Signed)
Pt is in isolation room and he is aggressive and hurting himself by hitting his head on the wall.

## 2019-12-25 NOTE — Progress Notes (Signed)
CSW received a call from pt's RN stating pt had refused his oral meds today, had eventually become agitated and now is lying on the floor by choice with a sheet on him for warmth.  CSW will continue to follow for D/C needs.  Paul Hendricks. Shelaine Frie  [name], LCSW, LCAS, CCS Transitions of Care Clinical Social Worker Care Coordination Department Ph: [phone]

## 2019-12-25 NOTE — ED Notes (Signed)
Pt not cooperative with Vitals, was becoming aggressive with Tech and placed in isolation room by [employer]. Provider notified

## 2019-12-25 NOTE — ED Notes (Signed)
Tech reported that patient had been banging his head against the door last shift. RN assessed patient's head and found old scab on head, no bleeding or drainage noted. Patient was alert and at his baseline behavior. Pupils equal and reactive to light. MD notified.

## 2019-12-25 NOTE — Consult Note (Signed)
  Dr. Fredderick Phenix requesting medications for agitation/episodes of behavior outburst.   Message sent to Dr. Fredderick Phenix informing the following.   Patient has been psychiatrically cleared.   Spoke with Dr. Lucianne Muss related to medication for agitation.  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  He is also non-verbal, so we are unable to understand him or what he wants and banging head, yelling and aggressiveness are typical behaviors.  Patient is currently taking multiple antipsychotic medications and need to be careful about that because there is a tendency to lower seizure threshold.  Instead of Geodon Dr. Lucianne Muss recommends Haldol 5 mg bid prn for agitation with Cogentin 0.5 mg Bid.  Also recommended Trazodone to be given only at bedtime.

## 2019-12-25 NOTE — ED Provider Notes (Signed)
Emergency Medicine Observation Re-evaluation Note  Paul Hendricks is a 29 y.o. male, seen on rounds today.  Pt initially presented to the ED for complaints of Seizures Currently, the patient is awaiting dispo  Physical Exam  BP 116/83 (BP Location: Left Arm)   Pulse 87   Resp 20   SpO2 96%  Physical Exam [place] today  ED Course / MDM  EKG:EKG Interpretation  Date/Time:  Sunday December 23 2019 23:37:23 EDT Ventricular Rate:  93 PR Interval:  142 QRS Duration: 70 QT Interval:  360 QTC Calculation: 447 R Axis:   66 Text Interpretation: Normal sinus rhythm Normal ECG No significant change since last tracing Confirmed by Paul Hendricks [phone]) on 12/23/2019 11:50:58 PM    I have reviewed the labs performed to date as well as medications administered while in observation.  Recent changes in the last 24 hours include Geodon for safety due to head-butting wall.  Plan  Current plan is for discharge to group home. Patient medically and psychiatrically clear. Has severe autism/nonverbal. Had seizures recently, meds adjusted at [facility] last week. Behavior seems to have been exacerbated by unfamiliar environment. SW working with his Group Home for a plan to return Patient is not under full IVC at this time.   Paul Savoy, MD 12/25/19 [phone]

## 2019-12-25 NOTE — ED Notes (Signed)
Pt eat 75% of his dinner the rest is in his room

## 2019-12-25 NOTE — Progress Notes (Addendum)
CSW updated pt's group home that pt had become agitated and was now insisting on lying on the floor after refusing meds all day.  [name] of the group home voiced understanding and states if D/C is delayed then they can only accept the pt in the morning.  [name], group home owner, states that Paul Hendricks will be awaiting return call from the 1st shift ED CSW/RN CM on the morning of 12/26/19, so transport via [employer] can be facilitated then.  [name] is asking that pt's AVS and his updated MAR are sent with the pt to the group home at D/C.  2nd shift ED CSW will leave handoff for 1st shift ED CSW.  CSW will continue to follow for D/C needs.  Dorothe Pea. Jazzmin Newbold  [name], LCSW, LCAS, CCS Transitions of Care Clinical Social Worker Care Coordination Department Ph: [phone]

## 2019-12-25 NOTE — ED Notes (Signed)
Patient was given lunch tray and ate two slices of pizza.

## 2019-12-25 NOTE — ED Notes (Signed)
Patient is alert and in NAD. Patient has a larger scab on front of head from banging head on isolation room. Patient is not currently aggressive or combative at this time. Patient removed from isolation room back to his room in [place]

## 2019-12-25 NOTE — ED Notes (Signed)
Per Dr. Christoper Fabian request to contact Psych for medication recommendation after being informed of Pt episode. Psych was notified and informed of Pt status. As charted Pt has been refusing PO meds during this shift.

## 2019-12-26 ENCOUNTER — Telehealth: Payer: Self-pay | Admitting: Neurology

## 2019-12-26 MED ORDER — AMOXICILLIN-POT CLAVULANATE 875-125 MG PO TABS
1.0000 | ORAL_TABLET | Freq: Two times a day (BID) | ORAL | 0 refills | Status: DC
Start: 2019-12-26 — End: 2020-01-30

## 2019-12-26 MED ORDER — VALTOCO 20 MG DOSE 10 MG/0.1ML NA LQPK: NASAL | 5 refills | Status: DC

## 2019-12-26 NOTE — Social Work (Signed)
@  10:21am TOC CSW has begun the process of dc.  CSW has spoken with Adria Dill (Mother)  [phone] and [name] ([facility] Owner) [phone] about Safe Transportation.  TOC CSW was given verbal for [employer] to be signed, both CSW and CM signed waiver and address for transport was verified ([address]  [place] [address]).    @12 :37pm CSW--[facility] was then called to assist pt with transportation.     @1 :05pm [name] Okonji ([facility] Owner) [phone] was then notified by CSW that pt was with [facility] and on his way to [address]  [place] [phone].  CSW also shared that the make and color of Safe [facility] ([place], [facility] , and label [id number]).   CSW will continue to follow for D/C needs.  Steffi Noviello Tarpley-Carter, [name], LCSW-A                  [employer] ED Transitions of CareClinical Social Worker [email or web address] [phone]

## 2019-12-26 NOTE — Telephone Encounter (Signed)
Spoke with [name] informed her that Dr Karel Jarvis sent in [place] script this morning

## 2019-12-26 NOTE — ED Provider Notes (Signed)
Prior to discharge, patient's nurse to inquire if he would need to have additional antibiotic prescription for the wound on his right hand that he has been receiving Augmentin during his hospital stay.  Since patient has had 4 days of this antibiotic, I will write a prescription for 3 additional days for a total of 6 tablets as treatment for his skin infection.   Fayrene Helper, PA-C 12/26/19 1129    Arby Barrette, MD 12/26/19 1322

## 2019-12-26 NOTE — ED Notes (Signed)
Paul Hendricks seems to understand [place]. It has been reported by two staff person's that he responds better to Spanish. Paul Hendricks seems to understand some English, as he responded appropriately with the assistance of hand motions.

## 2019-12-26 NOTE — Telephone Encounter (Signed)
[name] states that she spoke with Dr Karel Jarvis yesterday about patient. He is in the hospital and is going to be released today and brought home this morning. She states that Dr Karel Jarvis was to call him in a medication and the pharmacy does not have not heard anything from [place]. She would like call back ASAP   They use the [facility]

## 2019-12-26 NOTE — Telephone Encounter (Signed)
Rx sent, hopefully does not need prior auth. Thanks

## 2019-12-28 LAB — CULTURE, BLOOD (SINGLE): Culture: NO GROWTH

## 2020-01-03 ENCOUNTER — Encounter: Payer: Self-pay | Admitting: Neurology

## 2020-01-03 NOTE — Progress Notes (Signed)
Prior Approval # Confirmation # PA Type Recipient ID Recipient Submission Date Status Effective Dates Payer [id number] [facility] [id number] [facility] EVERY 01/03/2020 APPROVED 01/03/2020 - 01/02/2021 [employer]

## 2020-01-03 NOTE — Progress Notes (Signed)
LMOM for patient's mother to let her know that the medication has been approved. She had called in yesterday and was wanting an update when I had more information.

## 2020-01-07 ENCOUNTER — Other Ambulatory Visit: Payer: Self-pay | Admitting: Neurology

## 2020-01-07 MED ORDER — TOPIRAMATE 25 MG PO TABS: 25.0000 mg | ORAL_TABLET | Freq: Two times a day (BID) | ORAL | 11 refills | Status: DC

## 2020-01-21 MED FILL — Amoxicillin & K Clavulanate Tab 875-125 MG: ORAL | Qty: 1 | Status: AC

## 2020-01-21 MED FILL — Topiramate Tab 25 MG: ORAL | Qty: 25 | Status: AC

## 2020-01-21 MED FILL — Trazodone HCl Tab 100 MG: ORAL | Qty: 100 | Status: AC

## 2020-01-21 MED FILL — Melatonin Tab 5 MG: ORAL | Qty: 10 | Status: AC

## 2020-01-30 ENCOUNTER — Encounter: Payer: Self-pay | Admitting: Gastroenterology

## 2020-01-30 ENCOUNTER — Ambulatory Visit (INDEPENDENT_AMBULATORY_CARE_PROVIDER_SITE_OTHER): Payer: Medicaid Other | Admitting: Gastroenterology

## 2020-01-30 DIAGNOSIS — R634 Abnormal weight loss: Secondary | ICD-10-CM | POA: Diagnosis not present

## 2020-01-30 DIAGNOSIS — R630 Anorexia: Secondary | ICD-10-CM | POA: Diagnosis not present

## 2020-01-30 NOTE — Patient Instructions (Signed)
If you are age 29 or older, your body mass index should be between 23-30. Your Body mass index is 25.55 kg/m. If this is out of the aforementioned range listed, please consider follow up with your Primary Care Provider.  If you are age 77 or younger, your body mass index should be between 19-25. Your Body mass index is 25.55 kg/m. If this is out of the aformentioned range listed, please consider follow up with your Primary Care Provider.   You have been scheduled for an endoscopy. Please follow written instructions given to you at your visit today. If you use inhalers (even only as needed), please bring them with you on the day of your procedure.  Due to recent COVID-19 restrictions implemented by our local and state authorities and in an effort to keep both patients and staff as safe as possible, our hospital system now requires COVID-19 testing prior to any scheduled hospital procedure. Please go to [address] [place], [place], [place] [address] on 02-11-20 at  11:10am. This is a drive up testing site, you will not need to exit your vehicle.  You will not be billed at the time of testing but may receive a bill later depending on your insurance. The approximate cost of the test is $100. You must agree to quarantine from the time of your testing until the procedure date on 02-14-20 . This should include staying at home with ONLY the people you live with. Avoid take-out, grocery store shopping or leaving the house for any non-emergent reason. Failure to have your COVID-19 test done on the date and time you have been scheduled will result in cancellation of procedure. Please call our office at [phone] if you have any questions.   Due to recent changes in healthcare laws, you may see the results of your imaging and laboratory studies on MyChart before your provider has had a chance to review them.  We understand that in some cases there may be results that are confusing or concerning to you. Not all  laboratory results come back in the same time frame and the provider may be waiting for multiple results in order to interpret others.  Please give [place] 48 hours in order for your provider to thoroughly review all the results before contacting the office for clarification of your results.   Thank you for entrusting me with your care and choosing [facility].  Dr Christella Hartigan

## 2020-01-30 NOTE — Progress Notes (Signed)
HPI: This is a nonverbal, severely autistic 29 year old man whom I am meeting for the first time today.  He was referred by his primary care physician for weight loss and poor appetite.  With him today are to long-term care givers.  They have been for 15 years.  They tell me he can be violent.  He can bite and tear part of room if he gets very emotional.  He has not been eating well, including foods that he generally really likes.  They think he has lost about 20 pounds in the past year or so.  He moves his bowels pretty regularly  He was started on Megace 3 months ago by his primary care physician and has been taking it every day.  This has made no difference.  Old Data Reviewed: Blood work August 2021 CBC normal except for hemoglobin 11.7, complete metabolic profile normal except for AST 111, ALT 67.  INR normal, lipase normal  CT scan abdomen pelvis without IV contrast indication "abdominal pain, acute nonlocalized" findings essentially normal.  He underwent laparoscopic appendectomy December 2020 acute appendicitis with peritonitis.     Review of systems: Pertinent positive and negative review of systems were noted in the above HPI section. All other review negative.   Past Medical History:  Diagnosis Date  . Anxiety   . Autism disorder    WITH BEHAVIORAL ISSUES  . Seizures (HCC)    LAST SEIZURE 5-6 YRS AGO    Past Surgical History:  Procedure Laterality Date  . LAPAROSCOPIC APPENDECTOMY N/A 05/23/2019   Procedure: APPENDECTOMY LAPAROSCOPIC;  Surgeon: Kinsinger, De Blanch, MD;  Location: [facility] OR;  Service: General;  Laterality: N/A;  . TOOTH EXTRACTION N/A 12/23/2014   Procedure: EXTRACTION  OF TEETH 9,60,45,40;  Surgeon: Lincoln Brigham, DDS;  Location: [facility] OR;  Service: Oral Surgery;  Laterality: N/A;    Current Outpatient Medications  Medication Sig Dispense Refill  . ARIPiprazole (ABILIFY) 20 MG tablet Take 10 mg by mouth See admin instructions. At 1600 and 2000 for  aggression     . atorvastatin (LIPITOR) 20 MG tablet Take 20 mg by mouth daily.    . clonazePAM (KLONOPIN) 1 MG tablet Take 1 mg by mouth See admin instructions. At 0800, 1600 and 2000    . divalproex (DEPAKOTE ER) 500 MG 24 hr tablet Take 1 tablet in AM, 3 tablets in PM (Patient taking differently: Take 500-1,500 mg by mouth See admin instructions. Take 500mg  in the morning and 1500mg  in the evening.) 120 tablet 11  . guanFACINE (TENEX) 2 MG tablet Take 2 mg by mouth See admin instructions. At 1600 and 2000     . megestrol (MEGACE) 40 MG tablet Take 40 mg by mouth See admin instructions. At 0800, 1600, 2000    . Melatonin 5 MG SUBL Place 10 mg under the tongue at bedtime.    . naltrexone (DEPADE) 50 MG tablet Take 25 mg by mouth See admin instructions. At 1600 and 2000    . Omega-3 Fatty Acids (FISH OIL) 1000 MG CAPS Take 1,000 mg by mouth 2 (two) times daily.    2001 topiramate (TOPAMAX) 25 MG tablet Take 1 tablet (25 mg total) by mouth 2 (two) times daily. 60 tablet 11  . traZODone (DESYREL) 100 MG tablet Take 100 mg by mouth 2 (two) times daily. At 0800 and 2000 for aggression/sleep/anxiety    . ziprasidone (GEODON) 20 MG capsule Take 20 mg by mouth daily.     No current facility-administered medications  for this visit.    Allergies as of 01/30/2020  . (No Known Allergies)    Family History  Family history unknown: Yes    Social History   Socioeconomic History  . Marital status: Single    Spouse name: Not on file  . Number of children: Not on file  . Years of education: Not on file  . Highest education level: Not on file  Occupational History  . Not on file  Tobacco Use  . Smoking status: Never Smoker  . Smokeless tobacco: Never Used  Vaping Use  . Vaping Use: Never used  Substance and Sexual Activity  . Alcohol use: No    Alcohol/week: 0.0 standard drinks  . Drug use: No  . Sexual activity: Not on file  Other Topics Concern  . Not on file  Social History Narrative  .  Not on file   Social Determinants of Health   Financial Resource Strain:   . Difficulty of Paying Living Expenses: Not on file  Food Insecurity:   . Worried About Programme researcher, broadcasting/film/video in the Last Year: Not on file  . Ran Out of Food in the Last Year: Not on file  Transportation Needs:   . Lack of Transportation (Medical): Not on file  . Lack of Transportation (Non-Medical): Not on file  Physical Activity:   . Days of Exercise per Week: Not on file  . Minutes of Exercise per Session: Not on file  Stress:   . Feeling of Stress : Not on file  Social Connections:   . Frequency of Communication with Friends and Family: Not on file  . Frequency of Social Gatherings with Friends and Family: Not on file  . Attends Religious Services: Not on file  . Active Member of Clubs or Organizations: Not on file  . Attends Banker Meetings: Not on file  . Marital Status: Not on file  Intimate Partner Violence:   . Fear of Current or Ex-Partner: Not on file  . Emotionally Abused: Not on file  . Physically Abused: Not on file  . Sexually Abused: Not on file     Physical Exam: Pulse 84   Ht 6\' 2"  (1.88 m)   Wt 199 lb (90.3 kg)   BMI 25.55 kg/m  Constitutional: Autistic male, nonverbal.  Seems quite anxious, quite tall and fit appearing Psychiatric: alert and oriented x0 Eyes: extraocular movements intact I did not examine him more directly because he was becoming emotional and agitated and the caregiver said he could "rip apart the room" if this got out of control.   Assessment and plan: 29 y.o. male with severe autism, nonverbal.  He has been losing weight and not eating well  Explained to them that it would be very difficult to know what is causing his symptoms of weight loss and poor eating without some further testing.  I recommended we proceed with upper endoscopy at his soonest convenience.  This will have to be done at the hospital setting with anesthesia support.  He might  require general anesthesia given his autism and his aggressiveness at times.  I explained to the caregivers that one of his parents who live here in [place] will need to be present with him to give appropriate consent.    Please see the "Patient Instructions" section for addition details about the plan.   [place], MD [facility] 01/30/2020, 10:36 AM  Cc: 03/31/2020, PA-C  Total time on date of encounter was 77  minutes (this included time spent preparing to see the patient reviewing records; obtaining and/or reviewing separately obtained history; performing a medically appropriate exam and/or evaluation; counseling and educating the patient and family if present; ordering medications, tests or procedures if applicable; and documenting clinical information in the health record).

## 2020-02-11 ENCOUNTER — Telehealth: Payer: Self-pay

## 2020-02-11 ENCOUNTER — Inpatient Hospital Stay (HOSPITAL_COMMUNITY)
Admission: RE | Admit: 2020-02-11 | Discharge: 2020-02-11 | Disposition: A | Discharge status: Home / Self Care | Payer: Medicaid Other | Source: Ambulatory Visit

## 2020-02-11 NOTE — Progress Notes (Signed)
Pt unable to be tested for covid, as he could not follow commands. Even with the two men trying to help, the test could not be performed. Spoke with Lupita Leash from [facility] to inform her of the issue. Per Lupita Leash, the pt will have to arrive at 0830 on Thurs 9/23 to be tested prior to his procedure. Pt's caretakers given the information.

## 2020-02-11 NOTE — Telephone Encounter (Signed)
Dr Christella Hartigan per [facility] the pt is refusing to be tested for COVID.  He showed up at the appt today and 3 people had to try and hold him down for the COVID test and they were still not successful.  He is scheduled for EGD on 9/23.  Please advise

## 2020-02-11 NOTE — Telephone Encounter (Signed)
Please ask if there are any other testing options for cases done at [facility]?  IF not then the case will need to be cancelled. Let me know.

## 2020-02-12 NOTE — Telephone Encounter (Signed)
Pt's caregiver Okey Dupre is requesting a call back from a nurse to provide further info on the pt.

## 2020-02-12 NOTE — Telephone Encounter (Signed)
I spoke with the pt's caregiver Okey Dupre and she is bringing the pt in today and try to have him get tested successfully.  The pt is autistic and she is not sure she will be able to get this done.  She will call back later today and let us know if he was able to tolerate. I also called [facility] end[facility] and made them aware.

## 2020-02-12 NOTE — Telephone Encounter (Signed)
The pt caregiver has asked if the pt can swab himself for the testing.  I advised her that she needs to speak with the COVID testing center and discuss further options.  The pt has been advised of the information and verbalized understanding.

## 2020-02-13 ENCOUNTER — Telehealth: Payer: Self-pay

## 2020-02-13 DIAGNOSIS — R634 Abnormal weight loss: Secondary | ICD-10-CM

## 2020-02-13 NOTE — Telephone Encounter (Signed)
The pt caregiver Okey Dupre has been advised that the procedure has been cancelled and lab orders entered.  She states she will bring in next week.

## 2020-02-13 NOTE — Telephone Encounter (Signed)
-----   Message from Rachael Fee, MD sent at 02/13/2020  4:27 AM EDT ----- It looks like he has not had his Covid test for tomorrow's [facility] EGD yet.  We thought this may be a real barrier for him.  Please contact the caregivers, cancel the procedure.  Instead let's arrange H. Pylori stool antigen testing, send blood for ttG, total IgA, repeat CBC, CMET, TSH.  If all negative then we'll see if he can undergo UGI barium testing.  Thanks

## 2020-02-14 ENCOUNTER — Ambulatory Visit (HOSPITAL_COMMUNITY): Admission: RE | Admit: 2020-02-14 | Payer: Medicaid Other | Source: Home / Self Care | Admitting: Gastroenterology

## 2020-02-14 SURGERY — ESOPHAGOGASTRODUODENOSCOPY (EGD) WITH PROPOFOL
Anesthesia: General

## 2020-02-20 ENCOUNTER — Other Ambulatory Visit (INDEPENDENT_AMBULATORY_CARE_PROVIDER_SITE_OTHER): Payer: Medicaid Other

## 2020-02-20 DIAGNOSIS — R634 Abnormal weight loss: Secondary | ICD-10-CM

## 2020-02-20 LAB — CBC WITH DIFFERENTIAL/PLATELET
Basophils Absolute: 0 10*3/uL (ref 0.0–0.1)
Basophils Relative: 0.5 % (ref 0.0–3.0)
Eosinophils Absolute: 0.3 10*3/uL (ref 0.0–0.7)
Eosinophils Relative: 2.5 % (ref 0.0–5.0)
HCT: 41.1 % (ref 39.0–52.0)
Hemoglobin: 13.8 g/dL (ref 13.0–17.0)
Lymphocytes Relative: 57.2 % — ABNORMAL HIGH (ref 12.0–46.0)
Lymphs Abs: 5.8 10*3/uL — ABNORMAL HIGH (ref 0.7–4.0)
MCHC: 33.7 g/dL (ref 30.0–36.0)
MCV: 92 fl (ref 78.0–100.0)
Monocytes Absolute: 0.6 10*3/uL (ref 0.1–1.0)
Monocytes Relative: 5.5 % (ref 3.0–12.0)
Neutro Abs: 3.5 10*3/uL (ref 1.4–7.7)
Neutrophils Relative %: 34.3 % — ABNORMAL LOW (ref 43.0–77.0)
Platelets: 163 10*3/uL (ref 150.0–400.0)
RBC: 4.47 Mil/uL (ref 4.22–5.81)
RDW: 12.4 % (ref 11.5–15.5)
WBC: 10.2 10*3/uL (ref 4.0–10.5)

## 2020-02-20 LAB — COMPREHENSIVE METABOLIC PANEL
ALT: 46 U/L (ref 0–53)
AST: 40 U/L — ABNORMAL HIGH (ref 0–37)
Albumin: 4.5 g/dL (ref 3.5–5.2)
Alkaline Phosphatase: 47 U/L (ref 39–117)
BUN: 15 mg/dL (ref 6–23)
CO2: 26 mEq/L (ref 19–32)
Calcium: 9.4 mg/dL (ref 8.4–10.5)
Chloride: 106 mEq/L (ref 96–112)
Creatinine, Ser: 1.14 mg/dL (ref 0.40–1.50)
GFR: 92.06 mL/min (ref >60.00)
Glucose, Bld: 104 mg/dL — ABNORMAL HIGH (ref 70–99)
Potassium: 4.4 mEq/L (ref 3.5–5.1)
Sodium: 140 mEq/L (ref 135–145)
Total Bilirubin: 0.5 mg/dL (ref 0.2–1.2)
Total Protein: 7.4 g/dL (ref 6.0–8.3)

## 2020-02-20 LAB — TSH: TSH: 9.74 u[IU]/mL — ABNORMAL HIGH (ref 0.35–4.50)

## 2020-02-20 LAB — IGA: IgA: 250 mg/dL (ref 68–378)

## 2020-02-22 LAB — TISSUE TRANSGLUTAMINASE, IGA: (tTG) Ab, IgA: <1 U/mL

## 2020-02-27 ENCOUNTER — Other Ambulatory Visit: Payer: Medicaid Other

## 2020-03-05 ENCOUNTER — Other Ambulatory Visit: Payer: Medicaid Other

## 2020-03-05 DIAGNOSIS — R634 Abnormal weight loss: Secondary | ICD-10-CM

## 2020-03-06 LAB — HELICOBACTER PYLORI  SPECIAL ANTIGEN
MICRO NUMBER:: 11067062
SPECIMEN QUALITY: ADEQUATE

## 2020-08-16 ENCOUNTER — Emergency Department (HOSPITAL_COMMUNITY)
Admission: EM | Admit: 2020-08-16 | Discharge: 2020-08-16 | Disposition: A | Discharge status: Home / Self Care | Payer: Medicaid Other | Attending: Emergency Medicine | Admitting: Emergency Medicine

## 2020-08-16 ENCOUNTER — Other Ambulatory Visit: Payer: Self-pay

## 2020-08-16 ENCOUNTER — Encounter (HOSPITAL_COMMUNITY): Payer: Self-pay

## 2020-08-16 DIAGNOSIS — R609 Edema, unspecified: Secondary | ICD-10-CM | POA: Diagnosis not present

## 2020-08-16 DIAGNOSIS — R451 Restlessness and agitation: Secondary | ICD-10-CM | POA: Diagnosis not present

## 2020-08-16 DIAGNOSIS — R231 Pallor: Secondary | ICD-10-CM | POA: Diagnosis not present

## 2020-08-16 DIAGNOSIS — Z79899 Other long term (current) drug therapy: Secondary | ICD-10-CM | POA: Insufficient documentation

## 2020-08-16 DIAGNOSIS — F71 Moderate intellectual disabilities: Secondary | ICD-10-CM | POA: Insufficient documentation

## 2020-08-16 DIAGNOSIS — F840 Autistic disorder: Secondary | ICD-10-CM | POA: Insufficient documentation

## 2020-08-16 DIAGNOSIS — R21 Rash and other nonspecific skin eruption: Secondary | ICD-10-CM | POA: Diagnosis present

## 2020-08-16 LAB — CBC WITH DIFFERENTIAL/PLATELET
Abs Immature Granulocytes: 0.03 10*3/uL (ref 0.00–0.07)
Basophils Absolute: 0 10*3/uL (ref 0.0–0.1)
Basophils Relative: 0 %
Eosinophils Absolute: 0.2 10*3/uL (ref 0.0–0.5)
Eosinophils Relative: 2 %
HCT: 34.6 % — ABNORMAL LOW (ref 39.0–52.0)
Hemoglobin: 10.6 g/dL — ABNORMAL LOW (ref 13.0–17.0)
Immature Granulocytes: 0 %
Lymphocytes Relative: 37 %
Lymphs Abs: 3 10*3/uL (ref 0.7–4.0)
MCH: 31.5 pg (ref 26.0–34.0)
MCHC: 30.6 g/dL (ref 30.0–36.0)
MCV: 102.7 fL — ABNORMAL HIGH (ref 80.0–100.0)
Monocytes Absolute: 1.3 10*3/uL — ABNORMAL HIGH (ref 0.1–1.0)
Monocytes Relative: 16 %
Neutro Abs: 3.5 10*3/uL (ref 1.7–7.7)
Neutrophils Relative %: 45 %
Platelets: 289 10*3/uL (ref 150–400)
RBC: 3.37 MIL/uL — ABNORMAL LOW (ref 4.22–5.81)
RDW: 13.2 % (ref 11.5–15.5)
WBC: 8 10*3/uL (ref 4.0–10.5)
nRBC: 0 % (ref 0.0–0.2)

## 2020-08-16 LAB — VALPROIC ACID LEVEL: Valproic Acid Lvl: 73 ug/mL (ref 50.0–100.0)

## 2020-08-16 LAB — COMPREHENSIVE METABOLIC PANEL
ALT: 27 U/L (ref 0–44)
AST: 46 U/L — ABNORMAL HIGH (ref 15–41)
Albumin: 2.7 g/dL — ABNORMAL LOW (ref 3.5–5.0)
Alkaline Phosphatase: 69 U/L (ref 38–126)
Anion gap: 6 (ref 5–15)
BUN: 12 mg/dL (ref 6–20)
CO2: 24 mmol/L (ref 22–32)
Calcium: 8.8 mg/dL — ABNORMAL LOW (ref 8.9–10.3)
Chloride: 111 mmol/L (ref 98–111)
Creatinine, Ser: 1.02 mg/dL (ref 0.61–1.24)
GFR, Estimated: >60 mL/min (ref >60)
Glucose, Bld: 91 mg/dL (ref 70–99)
Potassium: 5.2 mmol/L — ABNORMAL HIGH (ref 3.5–5.1)
Sodium: 141 mmol/L (ref 135–145)
Total Bilirubin: 0.5 mg/dL (ref 0.3–1.2)
Total Protein: 5.9 g/dL — ABNORMAL LOW (ref 6.5–8.1)

## 2020-08-16 LAB — PROTIME-INR
INR: 1.1 (ref 0.8–1.2)
Prothrombin Time: 13.7 seconds (ref 11.4–15.2)

## 2020-08-16 LAB — BRAIN NATRIURETIC PEPTIDE: B Natriuretic Peptide: 120.6 pg/mL — ABNORMAL HIGH (ref 0.0–100.0)

## 2020-08-16 MED ORDER — FUROSEMIDE 20 MG PO TABS: 20.0000 mg | ORAL_TABLET | Freq: Every day | ORAL | 0 refills | Status: DC

## 2020-08-16 MED ORDER — FUROSEMIDE 20 MG PO TABS
20.0000 mg | ORAL_TABLET | Freq: Once | ORAL | Status: AC
  Administered 2020-08-16: 20 mg via ORAL
  Filled 2020-08-16: qty 1

## 2020-08-16 MED ORDER — ZIPRASIDONE MESYLATE 20 MG IM SOLR
20.0000 mg | Freq: Once | INTRAMUSCULAR | Status: AC
  Administered 2020-08-16: 20 mg via INTRAMUSCULAR
  Filled 2020-08-16: qty 20

## 2020-08-16 MED ORDER — STERILE WATER FOR INJECTION IJ SOLN
INTRAMUSCULAR | Status: AC
  Administered 2020-08-16: 1 mL
  Filled 2020-08-16: qty 10

## 2020-08-16 MED ORDER — LORAZEPAM 2 MG/ML IJ SOLN
2.0000 mg | Freq: Once | INTRAMUSCULAR | Status: AC
  Administered 2020-08-16: 2 mg via INTRAMUSCULAR
  Filled 2020-08-16: qty 1

## 2020-08-16 MED ORDER — FUROSEMIDE 20 MG PO TABS
20.0000 mg | ORAL_TABLET | Freq: Every day | ORAL | 0 refills | Status: DC
Start: 2020-08-16 — End: 2021-02-05

## 2020-08-16 MED ORDER — STERILE WATER FOR INJECTION IJ SOLN
INTRAMUSCULAR | Status: AC
  Administered 2020-08-16: 10 mL
  Filled 2020-08-16: qty 10

## 2020-08-16 NOTE — ED Notes (Signed)
This RN spoke with mother at bedside and caregiver on the phone about plan for pt to be DC, went over DC papers, and pt caregiver is picking up meds. [facility] will be called for pt.

## 2020-08-16 NOTE — ED Notes (Signed)
Family at bedside. 

## 2020-08-16 NOTE — ED Triage Notes (Signed)
Per caretaker pt had rash to the back of his legs was placed on antibiotics that caused another rash to the back of his legs. This AM caretaker noticed scrotum swelling and rash Was seen by PCP this am for that and caretaker was told to bring here to the ER

## 2020-08-16 NOTE — ED Notes (Signed)
Pt is refusing to let this RN get vitals. Pt mom informed this RN that there is someone else coming to pick pt up and that pt no longer needs [facility].

## 2020-08-16 NOTE — ED Notes (Signed)
Patient verbalizes understanding of discharge instructions. Opportunity for questioning and answers were provided. Armband removed by staff, pt discharged from ED via wheelchair.  

## 2020-08-16 NOTE — ED Notes (Signed)
Spoke with both mother and caretaker regarding pt...mother is currently OTW here.  Security at bedside and [employer] is on per mother to help calm the pt.  The pt continues to Bite his hand and is extremely agitated, pale and clammy at this time.  At this time pt will not allow for anyone to touch him and he continues to bite himself and bang his hands on his head arms and legs as well as the windows and the wall.  We are unable to obtain any vitals at this time I have filled out the triage as best as I can.  Will complete further when the mother arrives.  Security had T formation in place holding his arm when he turned his wrist and scratched Windy Fast, security.  MD is aware and is assessing his situation for further care

## 2020-08-16 NOTE — ED Notes (Signed)
Pt ride here. Pt mother given DC paperwork. Pt in wheelchair

## 2020-08-16 NOTE — ED Provider Notes (Signed)
[name] EMERGENCY DEPARTMENT Provider Note   CSN: [id number] Arrival date & time: 08/16/20  1409     History Chief Complaint  Patient presents with  . Agitation    Swelling     Paul Hendricks is a 30 y.o. male.  Level 5 caveat secondary to autism mental retardation.  Reportedly brought here by caregiver after he was at his PCPs office and noted to have rash on his back.  No other history available at this time.  Patient is agitated and assaultive to staff.  Unknown how long this rash is been there.  The history is provided by a caregiver. The history is limited by a developmental delay.  Rash Location:  Torso Torso rash location:  Upper back and lower back Onset quality:  Unable to specify Timing:  Unable to specify Progression:  Unable to specify Context: medications        Past Medical History:  Diagnosis Date  . Anxiety   . Autism disorder    WITH BEHAVIORAL ISSUES  . Seizures (HCC)    LAST SEIZURE 5-6 YRS AGO    Patient Active Problem List   Diagnosis Date Noted  . Acute appendicitis 05/23/2019  . Acute constipation 10/10/2018  . Atopic dermatitis 10/10/2018  . Hypertriglyceridemia 01/12/2017  . 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 Autistic disorder 04/22/2015  . Bipolar disorder (HCC) 03/05/2015  . Impacted teeth with abnormal position 12/23/2014    Past Surgical History:  Procedure Laterality Date  . LAPAROSCOPIC APPENDECTOMY N/A 05/23/2019   Procedure: APPENDECTOMY LAPAROSCOPIC;  Surgeon: Kinsinger, De Blanch, MD;  Location: [facility] OR;  Service: General;  Laterality: N/A;  . TOOTH EXTRACTION N/A 12/23/2014   Procedure: EXTRACTION  OF TEETH 7,85,88,50;  Surgeon: Lincoln Brigham, DDS;  Location: [facility] OR;  Service: Oral Surgery;  Laterality: N/A;       Family History  Family history unknown: Yes    Social History   Tobacco Use  . Smoking status: Never Smoker  .  Smokeless tobacco: Never Used  Vaping Use  . Vaping Use: Never used  Substance Use Topics  . Alcohol use: No    Alcohol/week: 0.0 standard drinks  . Drug use: No    Home Medications Prior to Admission medications   Medication Sig Start Date End Date Taking? Authorizing Provider  ARIPiprazole (ABILIFY) 20 MG tablet Take 10 mg by mouth in the morning and at bedtime. (1600 & 2000)    [provider]  atorvastatin (LIPITOR) 20 MG tablet Take 20 mg by mouth daily. (0800)    [provider]  clonazePAM (KLONOPIN) 1 MG tablet Take 1 mg by mouth in the morning, at noon, and at bedtime. (0800, 1600 & 2000)    [provider]  divalproex (DEPAKOTE ER) 500 MG 24 hr tablet Take 1 tablet in AM, 3 tablets in PM Patient taking differently: Take 500-1,500 mg by mouth See admin instructions. Take 1 tablet (500 mg) by mouth in the morning & take 3 tablets (1500 mg) by mouth in the evening. (0800 & 1800) 11/02/19   Van Clines, MD  guanFACINE (TENEX) 2 MG tablet Take 2 mg by mouth in the morning and at bedtime. (1600 & 2000)    [provider]  megestrol (MEGACE) 40 MG tablet Take 40 mg by mouth in the morning, at noon, and at bedtime. (0800, 1600, 2000) 12/10/19   [provider]  Melatonin 5 MG SUBL Place 10  mg under the tongue at bedtime. (2000)    [provider]  naltrexone (DEPADE) 50 MG tablet Take 25 mg by mouth in the morning and at bedtime. (1600 & 2000)    [provider]  Omega-3 Fatty Acids (FISH OIL) 1000 MG CAPS Take 1,000 mg by mouth 2 (two) times daily. (0800 & 2000)    [provider]  topiramate (TOPAMAX) 25 MG tablet Take 1 tablet (25 mg total) by mouth 2 (two) times daily. Patient taking differently: Take 25 mg by mouth 2 (two) times daily. (0800 & 2000) 01/07/20   Van Clines, MD  traZODone (DESYREL) 100 MG tablet Take 100 mg by mouth 2 (two) times daily. (0800&2000)    [provider]  ziprasidone  (GEODON) 20 MG capsule Take 20 mg by mouth daily as needed (agitation.).     [provider]    Allergies    Patient has no known allergies.  Review of Systems   Review of Systems  Unable to perform ROS: Patient nonverbal  Skin: Positive for rash.    Physical Exam Updated Vital Signs BP 94/72 (BP Location: Left Arm)   Pulse 91   SpO2 100%   Physical Exam Vitals and nursing note reviewed.  Constitutional:      Appearance: He is well-developed.  HENT:     Head: Normocephalic and atraumatic.  Eyes:     Conjunctiva/sclera: Conjunctivae normal.  Cardiovascular:     Rate and Rhythm: Normal rate and regular rhythm.     Heart sounds: No murmur heard.   Pulmonary:     Effort: Pulmonary effort is normal. No respiratory distress.     Breath sounds: Normal breath sounds.  Abdominal:     Palpations: Abdomen is soft.     Tenderness: There is no abdominal tenderness.  Genitourinary:    Penis: Normal.      Testes: Normal.     Comments: Scrotal wall slightly edematous.  No particular warmth or mass appreciated.  Testes nontender. Musculoskeletal:     Cervical back: Neck supple.     Right lower leg: Edema present.     Left lower leg: Edema present.  Skin:    General: Skin is warm and dry.     Coloration: Skin is pale.  Neurological:     General: No focal deficit present.     Mental Status: He is alert. Mental status is at baseline.     ED Results / Procedures / Treatments   Labs (all labs ordered are listed, but only abnormal results are displayed) Labs Reviewed  COMPREHENSIVE METABOLIC PANEL - Abnormal; Notable for the following components:      Result Value   Potassium 5.2 (*)    Calcium 8.8 (*)    Total Protein 5.9 (*)    Albumin 2.7 (*)    AST 46 (*)    All other components within normal limits  CBC WITH DIFFERENTIAL/PLATELET - Abnormal; Notable for the following components:   RBC 3.37 (*)    Hemoglobin 10.6 (*)    HCT 34.6 (*)    MCV 102.7 (*)     Monocytes Absolute 1.3 (*)    All other components within normal limits  BRAIN NATRIURETIC PEPTIDE - Abnormal; Notable for the following components:   B Natriuretic Peptide 120.6 (*)    All other components within normal limits  VALPROIC ACID LEVEL  PROTIME-INR    EKG None  Radiology No results found.  Procedures Procedures   Medications Ordered  in ED Medications  ziprasidone (GEODON) injection 20 mg (20 mg Intramuscular Given 08/16/20 1512)  sterile water (preservative free) injection (10 mLs  Given 08/16/20 1515)  LORazepam (ATIVAN) injection 2 mg (2 mg Intramuscular Given 08/16/20 1602)  ziprasidone (GEODON) injection 20 mg (20 mg Intramuscular Given 08/16/20 1801)  sterile water (preservative free) injection (1 mL  Given 08/16/20 1801)  furosemide (LASIX) tablet 20 mg (20 mg Oral Given 08/16/20 2055)    ED Course  I have reviewed the triage vital signs and the nursing notes.  Pertinent labs & imaging results that were available during my care of the patient were reviewed by me and considered in my medical decision making (see chart for details).  Clinical Course as of 08/17/20 1000  Sat Aug 16, 2020  1650 And given Geodon and then Ativan with some improvement in his agitation.  Still difficult to examine [MB]  1652 Patient CBC coming back with normal white count.  Hemoglobin low but has been down there before. [MB]  1701 I cannot see any clinic notes but it looks like he was recently on Bactrim and Flagyl [MB]  1704 Patient's mother is here but she does not know much about his daily care as he lives in a group home.  Were able to reach out to the caregiver and she said he within the last week he was at the primary care doctor's office and he had blood work and an ultrasound of his legs that did not show any blood clot.  They put him on some antibiotics for possible skin infection on his legs.  It sounds like he pulls at the hair and they were worried he got infected. [MB]  1720  Labs showing his total protein and albumin low.  This may fit with his peripheral edema [MB]  1847 Will cover with some Lasix back at his facility and recommended PCP follow-up for further nutritional work-up. [MB]    Clinical Course User Index [MB] Terrilee Files, MD   MDM Rules/Calculators/A&P                          This patient complains of edema, rash; this involves an extensive number of treatment Options and is a complaint that carries with it a high risk of complications and Morbidity. The differential includes peripheral edema, cellulitis, torsion DVT  I ordered, reviewed and interpreted labs, which included CBC with normal white count, hemoglobin slightly lower than baseline, chemistries mildly elevated potassium normal renal function, albumin and total protein low, BNP mildly elevated no priors to compare with I ordered medication Geodon Ativan Lasix Additional history obtained from patient's mother and patient's caregiver Previous records obtained and reviewed in epic, no recent admissions   After the interventions stated above, I reevaluated the patient and found patient to be in no distress.  Patient's mother states he is at his baseline.  Discussed results of lab work and exam concerning for peripheral edema.  Recommended trial of diuretics and close follow-up with PCP.  She is comfortable with him returning to his facility for continued management.  Return instructions discussed.  Final Clinical Impression(s) / ED Diagnoses Final diagnoses:  Peripheral edema  Moderate intellectual disabilities    Rx / DC Orders ED Discharge Orders         Ordered    furosemide (LASIX) 20 MG tablet  Daily,   Status:  Discontinued        08/16/20 1848  furosemide (LASIX) 20 MG tablet  Daily,   Status:  Discontinued        08/16/20 1910    furosemide (LASIX) 20 MG tablet  Daily        08/16/20 1911           Terrilee FilesButler, Dornell Grasmick C, MD 08/17/20 1004

## 2020-08-16 NOTE — ED Notes (Signed)
[facility] called  

## 2020-08-16 NOTE — Discharge Instructions (Signed)
You were seen in the emergency department for increased swelling of your legs and scrotum.  Your blood work showed you to be slightly more anemic than your baseline and your albumin was very low.  This is likely worsening your peripheral edema.  We are starting you on a diuretic.  It would be important for you to follow-up with your primary care doctor to have your labs rechecked and to have further investigation on your nutritional status.

## 2020-10-21 ENCOUNTER — Telehealth: Payer: Self-pay | Admitting: Neurology

## 2020-10-21 NOTE — Telephone Encounter (Signed)
Would she be able to have a Depakote level done on him?

## 2020-10-21 NOTE — Telephone Encounter (Signed)
Pt c/o: seizure Missed medications?  no Sleep deprived?  no Alcohol intake?  no Back to their usual baseline self?  Yes  no, advise go to ER Current medications prescribed by Dr. Karel Jarvis: no doses missed.

## 2020-10-21 NOTE — Telephone Encounter (Signed)
Not able to do, difficuty to get here.tomorrow is the deadline 60 days, he is suppose to leave his group home.May have to go to a behavioral admission in [place] by psychriast.

## 2020-10-21 NOTE — Telephone Encounter (Signed)
Patient's caregiver Okey Dupre called in and left a message stating the patient had a grand mal seizure this morning at 8:00 AM this morning.

## 2020-10-22 NOTE — Telephone Encounter (Signed)
F/u as scheduled 6/15

## 2020-10-23 ENCOUNTER — Telehealth: Payer: Self-pay | Admitting: Neurology

## 2020-10-23 ENCOUNTER — Encounter (HOSPITAL_COMMUNITY): Payer: Self-pay | Admitting: Emergency Medicine

## 2020-10-23 ENCOUNTER — Other Ambulatory Visit: Payer: Self-pay

## 2020-10-23 ENCOUNTER — Emergency Department (HOSPITAL_COMMUNITY)
Admission: EM | Admit: 2020-10-23 | Discharge: 2021-05-11 | Disposition: A | Discharge status: Home / Self Care | Payer: Medicaid Other | Attending: Emergency Medicine | Admitting: Emergency Medicine

## 2020-10-23 DIAGNOSIS — F840 Autistic disorder: Secondary | ICD-10-CM | POA: Diagnosis not present

## 2020-10-23 DIAGNOSIS — Z79899 Other long term (current) drug therapy: Secondary | ICD-10-CM | POA: Diagnosis not present

## 2020-10-23 DIAGNOSIS — R569 Unspecified convulsions: Secondary | ICD-10-CM

## 2020-10-23 DIAGNOSIS — Z20822 Contact with and (suspected) exposure to covid-19: Secondary | ICD-10-CM | POA: Diagnosis not present

## 2020-10-23 DIAGNOSIS — G40309 Generalized idiopathic epilepsy and epileptic syndromes, not intractable, without status epilepticus: Secondary | ICD-10-CM

## 2020-10-23 DIAGNOSIS — F72 Severe intellectual disabilities: Secondary | ICD-10-CM | POA: Diagnosis not present

## 2020-10-23 DIAGNOSIS — G40909 Epilepsy, unspecified, not intractable, without status epilepticus: Secondary | ICD-10-CM | POA: Insufficient documentation

## 2020-10-23 DIAGNOSIS — F39 Unspecified mood [affective] disorder: Secondary | ICD-10-CM | POA: Diagnosis not present

## 2020-10-23 MED ORDER — DIVALPROEX SODIUM ER 500 MG PO TB24
1500.0000 mg | ORAL_TABLET | Freq: Every day | ORAL | Status: DC
  Administered 2020-10-23 – 2020-10-28 (×5): 1500 mg via ORAL
  Filled 2020-10-23 (×11): qty 3

## 2020-10-23 MED ORDER — MIRTAZAPINE 7.5 MG PO TABS
15.0000 mg | ORAL_TABLET | Freq: Every day | ORAL | Status: DC
  Administered 2020-10-23 – 2021-05-10 (×179): 15 mg via ORAL
  Filled 2020-10-23 (×191): qty 2

## 2020-10-23 MED ORDER — LORAZEPAM 2 MG/ML IJ SOLN
2.0000 mg | Freq: Once | INTRAMUSCULAR | Status: AC
  Administered 2020-10-28: 2 mg via INTRAMUSCULAR

## 2020-10-23 MED ORDER — OMEGA-3-ACID ETHYL ESTERS 1 G PO CAPS
1.0000 g | ORAL_CAPSULE | Freq: Every day | ORAL | Status: DC
  Administered 2020-10-24 – 2021-05-10 (×77): 1 g via ORAL
  Filled 2020-10-23 (×202): qty 1

## 2020-10-23 MED ORDER — ONDANSETRON HCL 4 MG PO TABS
4.0000 mg | ORAL_TABLET | Freq: Three times a day (TID) | ORAL | Status: DC | PRN
  Administered 2021-03-30 – 2021-05-10 (×3): 4 mg via ORAL
  Filled 2020-10-23 (×4): qty 1

## 2020-10-23 MED ORDER — NALTREXONE HCL 50 MG PO TABS
25.0000 mg | ORAL_TABLET | Freq: Two times a day (BID) | ORAL | Status: DC
  Administered 2020-10-23 – 2021-05-10 (×332): 25 mg via ORAL
  Filled 2020-10-23 (×411): qty 1

## 2020-10-23 MED ORDER — MELATONIN 5 MG SL SUBL: 10.0000 mg | SUBLINGUAL_TABLET | Freq: Every day | SUBLINGUAL | Status: DC

## 2020-10-23 MED ORDER — FUROSEMIDE 20 MG PO TABS
20.0000 mg | ORAL_TABLET | Freq: Every day | ORAL | Status: DC
  Administered 2020-10-23 – 2021-05-11 (×167): 20 mg via ORAL
  Filled 2020-10-23 (×203): qty 1

## 2020-10-23 MED ORDER — POLYETHYLENE GLYCOL 3350 17 GM/SCOOP PO POWD: 17.0000 g | Freq: Every day | ORAL | Status: DC

## 2020-10-23 MED ORDER — MEGESTROL ACETATE 40 MG PO TABS
40.0000 mg | ORAL_TABLET | Freq: Three times a day (TID) | ORAL | Status: DC
  Administered 2020-10-23 – 2021-01-15 (×177): 40 mg via ORAL
  Filled 2020-10-23 (×258): qty 1

## 2020-10-23 MED ORDER — DIVALPROEX SODIUM 500 MG PO DR TAB
500.0000 mg | DELAYED_RELEASE_TABLET | Freq: Once | ORAL | Status: DC
  Filled 2020-10-23: qty 1

## 2020-10-23 MED ORDER — MELATONIN 5 MG PO TABS
10.0000 mg | ORAL_TABLET | Freq: Every day | ORAL | Status: DC
  Administered 2020-10-23 – 2021-05-10 (×175): 10 mg via ORAL
  Filled 2020-10-23 (×188): qty 2

## 2020-10-23 MED ORDER — TOPIRAMATE 25 MG PO TABS
25.0000 mg | ORAL_TABLET | Freq: Two times a day (BID) | ORAL | Status: DC
  Administered 2020-10-23 – 2021-05-11 (×343): 25 mg via ORAL
  Filled 2020-10-23 (×385): qty 1

## 2020-10-23 MED ORDER — GUANFACINE HCL 1 MG PO TABS
2.0000 mg | ORAL_TABLET | Freq: Every day | ORAL | Status: DC
  Administered 2020-10-23 – 2021-05-10 (×169): 2 mg via ORAL
  Filled 2020-10-23 (×206): qty 2

## 2020-10-23 MED ORDER — DOCUSATE SODIUM 100 MG PO CAPS
200.0000 mg | ORAL_CAPSULE | Freq: Every day | ORAL | Status: DC
  Administered 2020-10-24 – 2020-12-15 (×14): 200 mg via ORAL
  Filled 2020-10-23 (×25): qty 2

## 2020-10-23 MED ORDER — ALUM & MAG HYDROXIDE-SIMETH 200-200-20 MG/5ML PO SUSP: 30.0000 mL | Freq: Four times a day (QID) | ORAL | Status: DC | PRN

## 2020-10-23 MED ORDER — POLYETHYLENE GLYCOL 3350 17 G PO PACK
17.0000 g | PACK | Freq: Every day | ORAL | Status: DC
  Administered 2020-11-02 – 2020-12-16 (×16): 17 g via ORAL
  Filled 2020-10-23 (×57): qty 1

## 2020-10-23 MED ORDER — CLONAZEPAM 1 MG PO TABS
1.0000 mg | ORAL_TABLET | Freq: Three times a day (TID) | ORAL | Status: DC | PRN
  Administered 2020-10-23 – 2021-05-10 (×155): 1 mg via ORAL
  Filled 2020-10-23 (×49): qty 1
  Filled 2020-10-23: qty 2
  Filled 2020-10-23 (×71): qty 1
  Filled 2020-10-23: qty 2
  Filled 2020-10-23 (×14): qty 1
  Filled 2020-10-23: qty 2
  Filled 2020-10-23 (×15): qty 1
  Filled 2020-10-23: qty 2
  Filled 2020-10-23 (×16): qty 1
  Filled 2020-10-23: qty 2
  Filled 2020-10-23 (×4): qty 1
  Filled 2020-10-23: qty 2
  Filled 2020-10-23 (×3): qty 1

## 2020-10-23 MED ORDER — HYDROXYZINE HCL 25 MG PO TABS
25.0000 mg | ORAL_TABLET | Freq: Three times a day (TID) | ORAL | Status: DC | PRN
  Administered 2020-10-24 – 2020-10-28 (×3): 25 mg via ORAL
  Filled 2020-10-23 (×5): qty 1

## 2020-10-23 MED ORDER — DIVALPROEX SODIUM ER 500 MG PO TB24
500.0000 mg | ORAL_TABLET | Freq: Every day | ORAL | Status: DC
  Administered 2020-10-24 – 2020-11-03 (×5): 500 mg via ORAL
  Filled 2020-10-23 (×11): qty 1

## 2020-10-23 MED ORDER — ATORVASTATIN CALCIUM 10 MG PO TABS
20.0000 mg | ORAL_TABLET | Freq: Every day | ORAL | Status: DC
  Administered 2020-10-23 – 2021-02-20 (×103): 20 mg via ORAL
  Filled 2020-10-23 (×116): qty 2

## 2020-10-23 MED ORDER — ACETAMINOPHEN 325 MG PO TABS
650.0000 mg | ORAL_TABLET | ORAL | Status: DC | PRN
  Administered 2021-01-21 – 2021-04-25 (×39): 650 mg via ORAL
  Filled 2020-10-23 (×49): qty 2

## 2020-10-23 MED ORDER — DIVALPROEX SODIUM ER 500 MG PO TB24: 500.0000 mg | ORAL_TABLET | ORAL | Status: DC

## 2020-10-23 MED ORDER — ZIPRASIDONE HCL 20 MG PO CAPS
60.0000 mg | ORAL_CAPSULE | Freq: Two times a day (BID) | ORAL | Status: DC
  Administered 2020-10-23 – 2020-10-28 (×8): 60 mg via ORAL
  Filled 2020-10-23 (×19): qty 3

## 2020-10-23 NOTE — Progress Notes (Signed)
..   Transition of Care [facility]) - Emergency Department Mini Assessment   Patient Details  Name: Paul Hendricks MRN: [id number] Date of Birth: 1991-03-29  [facility]) CM/SW Contact:    Aydeen Blume C Tarpley-Carter, LCSWA Phone Number: 10/23/2020, 4:49 PM   Clinical Narrative: TOC CSW attempted to contact [name] Okonji/[facility].  No one answered, message stating that they aren't accepting calls right now.  CSW proceeded to contact [facility] to intervene and do a well check on Paul Hendricks and let her know that pt is at the hospital ready for dc.  [facility] agreed to follow up on call.  Gatlin Kittell Tarpley-Carter, [name], LCSW-A Pronouns:  She, Her, Hers                  [name] Transitions of CareClinical Social Worker [email or web address] [phone]   ED Mini Assessment: What brought you to the Emergency Department? : Seizure  Barriers to Discharge: ED Unable to Make Contact with Facility/Family  Barrier interventions: Contact [facility]  Means of departure: Not know  Interventions which prevented an admission or readmission: Transportation Screening,Other (must enter comment) (Return to Group Home ([facility]))    Patient Contact and Communications Key Contact 1: Marge Duncans Date: 10/23/20,     Contact Phone Number: [phone] and [phone] Call outcome: Paul Hendricks did not answer phone.  Patient states their goals for this hospitalization and ongoing recovery are:: Return back to [facility]   Choice offered to / list presented to : NA  Admission diagnosis:  seizure Patient Active Problem List   Diagnosis Date Noted  . Acute appendicitis 05/23/2019  . Acute constipation 10/10/2018  . Atopic dermatitis 10/10/2018  . Hypertriglyceridemia 01/12/2017  . 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 Autistic disorder 04/22/2015  . Bipolar disorder (HCC)  03/05/2015  . Impacted teeth with abnormal position 12/23/2014   PCP:  Marva Panda, NP Pharmacy:   [facility], [place] - [address] [place] [address] Phone: [phone] Fax: [phone]

## 2020-10-23 NOTE — Progress Notes (Signed)
TOC CSW spoke with Paul Hendricks/[facility] [phone].  Paul stated that she gave pt a 60 day notice on 08/22/2020, it ended on 10/22/2020.  Paul was required by [employer] to give a 90 days notice of dc.  This information was also stated to [facility] by CSW.  Paul Hendricks is stating she will take pt back once pt receives a higher level of care, placed somewhere like [place].  CSW disclosed to [facility] that pt was not seen here for his behaviors, but for his seizures.  Pt has been treated, medically cleared, and is ready for pick up.  Paul stated she was not coming to pick up pt.  Paul Hendricks, [name], LCSW-A Pronouns:  She, Her, Hers                  [name] Transitions of CareClinical Social Worker [email or web address] [phone]

## 2020-10-23 NOTE — ED Notes (Signed)
Patient refusing vitals, RN aware.

## 2020-10-23 NOTE — ED Notes (Signed)
Pt from [facility] in [place].

## 2020-10-23 NOTE — ED Notes (Signed)
Attempted to contact mother and group home, no response from either.

## 2020-10-23 NOTE — ED Notes (Signed)
Patient still refusing Depakote, pacing the room and being loud/verbal. Provided a sandwich and water, instead pt is hyper-focusing on picking his skin.

## 2020-10-23 NOTE — ED Notes (Signed)
Pt refusing vitals.

## 2020-10-23 NOTE — ED Provider Notes (Signed)
[facility]-EMERGENCY DEPT Provider Note   CSN: [id number] Arrival date & time: 10/23/20  1006     History No chief complaint on file.   Paul Hendricks is a 30 y.o. male.  Patient with history of autism, nonverbal, currently resides at group home, history of seizure disorder on Depakote --presents today after refusing medications and having a seizure "after a meltdown" per paperwork with patient.  He was postictal and combative, transported to the ED.  Appears to now be back at his baseline.  Level 5 caveat due to underlying autism, nonverbal.        Past Medical History:  Diagnosis Date  . Anxiety   . Autism disorder    WITH BEHAVIORAL ISSUES  . Seizures (HCC)    LAST SEIZURE 5-6 YRS AGO    Patient Active Problem List   Diagnosis Date Noted  . Acute appendicitis 05/23/2019  . Acute constipation 10/10/2018  . Atopic dermatitis 10/10/2018  . Hypertriglyceridemia 01/12/2017  . 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 Autistic disorder 04/22/2015  . Bipolar disorder (HCC) 03/05/2015  . Impacted teeth with abnormal position 12/23/2014    Past Surgical History:  Procedure Laterality Date  . LAPAROSCOPIC APPENDECTOMY N/A 05/23/2019   Procedure: APPENDECTOMY LAPAROSCOPIC;  Surgeon: Kinsinger, De Blanch, MD;  Location: [facility] OR;  Service: General;  Laterality: N/A;  . TOOTH EXTRACTION N/A 12/23/2014   Procedure: EXTRACTION  OF TEETH 9,67,89,38;  Surgeon: Lincoln Brigham, DDS;  Location: [facility] OR;  Service: Oral Surgery;  Laterality: N/A;       Family History  Family history unknown: Yes    Social History   Tobacco Use  . Smoking status: Never Smoker  . Smokeless tobacco: Never Used  Vaping Use  . Vaping Use: Never used  Substance Use Topics  . Alcohol use: No    Alcohol/week: 0.0 standard drinks  . Drug use: No    Home Medications Prior to Admission medications   Medication Sig  Start Date End Date Taking? Authorizing Provider  ARIPiprazole (ABILIFY) 20 MG tablet Take 10 mg by mouth in the morning and at bedtime. (1600 & 2000)    [provider]  atorvastatin (LIPITOR) 20 MG tablet Take 20 mg by mouth daily. (0800)    [provider]  clonazePAM (KLONOPIN) 1 MG tablet Take 1 mg by mouth in the morning, at noon, and at bedtime. (0800, 1600 & 2000)    [provider]  divalproex (DEPAKOTE ER) 500 MG 24 hr tablet Take 1 tablet in AM, 3 tablets in PM Patient taking differently: Take 500-1,500 mg by mouth See admin instructions. Take 1 tablet (500 mg) by mouth in the morning & take 3 tablets (1500 mg) by mouth in the evening. (0800 & 1800) 11/02/19   Van Clines, MD  furosemide (LASIX) 20 MG tablet Take 1 tablet (20 mg total) by mouth daily. 08/16/20   Terrilee Files, MD  guanFACINE (TENEX) 2 MG tablet Take 2 mg by mouth in the morning and at bedtime. (1600 & 2000)    [provider]  megestrol (MEGACE) 40 MG tablet Take 40 mg by mouth in the morning, at noon, and at bedtime. (0800, 1600, 2000) 12/10/19   [provider]  Melatonin 5 MG SUBL Place 10 mg under the tongue at bedtime. (2000)    [provider]  naltrexone (DEPADE) 50 MG tablet Take 25 mg by mouth in the morning and at bedtime. (  1600 & 2000)    [provider]  Omega-3 Fatty Acids (FISH OIL) 1000 MG CAPS Take 1,000 mg by mouth 2 (two) times daily. (0800 & 2000)    [provider]  topiramate (TOPAMAX) 25 MG tablet Take 1 tablet (25 mg total) by mouth 2 (two) times daily. Patient taking differently: Take 25 mg by mouth 2 (two) times daily. (0800 & 2000) 01/07/20   Van Clines, MD  traZODone (DESYREL) 100 MG tablet Take 100 mg by mouth 2 (two) times daily. (0800&2000)    [provider]  ziprasidone (GEODON) 20 MG capsule Take 20 mg by mouth daily as needed (agitation.).     [provider]    Allergies    Patient has  no known allergies.  Review of Systems   Review of Systems  Unable to perform ROS: Patient nonverbal    Physical Exam Updated Vital Signs Ht 6\' 2"  (1.88 m)   Wt 91 kg   BMI 25.76 kg/m   Physical Exam Vitals and nursing note reviewed.  Constitutional:      Appearance: He is well-developed.  HENT:     Head: Atraumatic.     Comments: Macrocephalic    Right Ear: External ear normal.     Left Ear: External ear normal.  Eyes:     General:        Right eye: No discharge.        Left eye: No discharge.     Conjunctiva/sclera: Conjunctivae normal.  Cardiovascular:     Rate and Rhythm: Normal rate and regular rhythm.     Heart sounds: Normal heart sounds.  Pulmonary:     Effort: Pulmonary effort is normal.     Breath sounds: Normal breath sounds.  Abdominal:     Palpations: Abdomen is soft.     Tenderness: There is no abdominal tenderness.  Musculoskeletal:        General: No deformity.     Cervical back: Normal range of motion and neck supple.  Skin:    General: Skin is warm and dry.     Comments: Patient has several old, minor bruises to the skin.  Neurological:     Mental Status: He is alert.     Comments: Patient is awake and alert.  He moans and makes noises, actions with his hands.  He is poorly redirectable.  Initially in bed, gets up and walks around the room, making noise, during exam.  He is able to walk and balance without difficulty.  He has moving all extremities and does not appear to have any focal weakness.     ED Results / Procedures / Treatments   Labs (all labs ordered are listed, but only abnormal results are displayed) Labs Reviewed - No data to display  EKG None  Radiology No results found.  Procedures Procedures   Medications Ordered in ED Medications - No data to display  ED Course  I have reviewed the triage vital signs and the nursing notes.  Pertinent labs & imaging results that were available during my care of the patient were  reviewed by me and considered in my medical decision making (see chart for details).  Patient seen and examined. He is non-verbal, uncooperative. He is up in room, walking, apparently at baseline. RN attempting to redirect.  Unable to obtain vital signs on arrival.  Patient with normal range pulse and normal respiratory rate on my exam.  Noncooperative with blood pressure and temperature.  He  does not feel warm.  He does not appear shocky.  Vital signs reviewed and are as follows: Ht 6\' 2"  (1.88 m)   Wt 91 kg   BMI 25.76 kg/m   RN unable to contact group home or legal guardian to this point.  Will continue attempts.  11:46 AM RN spoke with patient's caseworker.  Patient's discharge date from the group home was reportedly yesterday.  RN also spoke with the group home manager who refuses to take the patient back.  He is going to be a placement issue.  We will get TOC involved.  2:06 PM Pt continues to hold. Dr. aware and involved in care. Will likely be here boarding for placement. Med reconciliation not yet completed for home meds to be ordered.    4:20 PM home medication orders placed.  BP (!) 125/105 (BP Location: Right Wrist)   Pulse 86   Temp 97.6 F (36.4 C) (Oral)   Resp 20   Ht 6\' 2"  (1.88 m)   Wt 91 kg   SpO2 100%   BMI 25.76 kg/m   5:35 PM Social work involved. Pt is medically cleared at this time.    MDM Rules/Calculators/A&P                          Pending safe discharge.  Patient with isolated seizure today per report.  He is back to his baseline upon arrival.  No indications for further work-up unless status changes.    Final Clinical Impression(s) / ED Diagnoses Final diagnoses:  Seizure [facility])    Rx / DC Orders ED Discharge Orders    None       , PA-C 10/23/20 1736    Renne Crigler, MD 10/24/20 [phone]
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
## 2020-10-23 NOTE — ED Notes (Signed)
Attempted to give pt depakote again, still refusing.

## 2020-10-23 NOTE — Progress Notes (Signed)
TOC CSW has attempted to contact [name] Torres/pts mother [phone] and [phone].  Neither of these numbers are accepting calls at this time.  CSW attempted to contact the LME/[name] [name] [phone], [phone], and [phone].  CSW left HIPPA compliant message with my contact information at each of those numbers.  Kenitha Glendinning Tarpley-Carter, [name], LCSW-A Pronouns:  She, Her, Hers                  [name] Transitions of CareClinical Social Worker [email or web address] [phone]

## 2020-10-23 NOTE — ED Notes (Signed)
Pt has been cooperative in tcu. Pt sat on bed and ate his dinner with out issues.

## 2020-10-23 NOTE — ED Notes (Signed)
Pt refused Depakote, PA aware.

## 2020-10-23 NOTE — Progress Notes (Signed)
TOC CSW received a call from [name] [name]/LME [phone].  [name] states she has spoke with [name]/ [facility], and [facility] is refusing to accept pt back.  [name] also states she has been working with [name] since 08/22/2020 to find placement for pt, but has not been offered a bed yet due to pts behaviors.  [name] stated she would continue to search for placement.  CSW disclosed to [name] that pt can not stay in ED and will need to be picked up since he has been medically cleared and dc'd.  [name] states she will continue to seek placement.   Khambrel Amsden Tarpley-Carter, [name], LCSW-A Pronouns:  She, Her, Hers                  [name] Transitions of CareClinical Social Worker [email or web address] [phone]

## 2020-10-23 NOTE — Telephone Encounter (Signed)
Care giver called to let us know he refused his meds this am, and he refused to get blood drawn tuesday. He has had two seizures, and has been admitted into the hospital.

## 2020-10-23 NOTE — ED Notes (Addendum)
Patient still refusing vitals, RN aware.

## 2020-10-23 NOTE — ED Triage Notes (Signed)
BIB EMS from a group home, unsure which one. Patient had a witnessed seizure this morning by group home staff, lasted about 5 min, had refused his seizure meds earlier this AM. EMS unable to obtain vitals or blood draw or IV, pt is severe autistic and aggressive. EMS did not give any meds.

## 2020-10-23 NOTE — Progress Notes (Signed)
TOC CSW received a phone call from [facility] with the following information that was given to him by [name] owner.  Paul Hendricks, [name], LCSW-A Pronouns:  She, Her, Hers                  [name] Transitions of CareClinical Social Worker [name] [phone]

## 2020-10-23 NOTE — ED Notes (Signed)
Care coordinator gave updated contact info for mother, it is [phone]. Called [facility] and they are refusing to take patient back. PA updated.

## 2020-10-24 MED ORDER — STERILE WATER FOR INJECTION IJ SOLN
INTRAMUSCULAR | Status: AC
  Filled 2020-10-24: qty 10

## 2020-10-24 MED ORDER — ZIPRASIDONE MESYLATE 20 MG IM SOLR
20.0000 mg | Freq: Once | INTRAMUSCULAR | Status: AC
  Administered 2020-10-24: 20 mg via INTRAMUSCULAR
  Filled 2020-10-24: qty 20

## 2020-10-24 NOTE — ED Notes (Signed)
Pt refuse his vital signs at this time

## 2020-10-24 NOTE — ED Notes (Signed)
Patient refused vitals and became very agitated

## 2020-10-24 NOTE — ED Notes (Signed)
Patient sleeping rise and fall of chest breathing observed  Sitter at bedside  

## 2020-10-24 NOTE — ED Notes (Signed)
Patient received breakfast tray.  Sitter at bedside.

## 2020-10-24 NOTE — ED Notes (Signed)
Patient sitting calmly on bed making non-verbal language Sitter at bedside 

## 2020-10-24 NOTE — ED Notes (Signed)
Pt uncooperative with VS at this time. RR 18 even and unlabored. Pt cooperative while taking his meds in ice-cream. NADN, will continue to monitor.
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
## 2020-10-24 NOTE — ED Notes (Signed)
Patient became agitated  Patient hit his hand on the wall.  Sitter at bedside

## 2020-10-24 NOTE — Progress Notes (Signed)
TOC CSW attempted to contact [name] [name]/Care Coordinator [phone] in regards to an update.  CSW left HIPPA compliant message with my contact information.  Austin Pongratz Tarpley-Carter, [name], LCSW-A Pronouns:  She, Her, Hers                  [name] Transitions of CareClinical Social Worker [email or web address] [phone]

## 2020-10-24 NOTE — ED Notes (Signed)
Patient ambulating in room with burst of non-verbal language. Patient appears calm.  Sitter at bedside.

## 2020-10-24 NOTE — Progress Notes (Signed)
TOC CSW spoke with [facility] Coordinator [phone] for an update on placement.  [name] stated Ms. Small/[facility] was interested in accepting pt to began search for a level VI Group Home.  [name] has sent Ms. Small all information requested to make a decision if pt is a good fit for [facility].  [name] stated she would provide CSW with contact information at a later time.  Tamula Morrical Tarpley-Carter, [name], LCSW-A Pronouns:  She, Her, Hers                  [name] Transitions of CareClinical Social Worker [email or web address] [phone]

## 2020-10-24 NOTE — ED Notes (Signed)
Patient sitting calmly on bed making non-verbal language Sitter at bedside

## 2020-10-24 NOTE — ED Notes (Signed)
Pt resting, sleeping comfortably at this time.

## 2020-10-24 NOTE — ED Notes (Signed)
Try to get pt vital signs he refused again

## 2020-10-24 NOTE — ED Provider Notes (Signed)
Emergency Medicine Observation Re-evaluation Note  Paul Hendricks is a 30 y.o. male, seen on rounds today.  Pt initially presented to the ED for complaints of No chief complaint on file. Currently, the patient is holding.  Pt is nonverbal  Pt sitting on bed rocking   Physical Exam  BP (!) 125/105 (BP Location: Right Wrist)   Pulse 86   Temp 97.6 F (36.4 C) (Oral)   Resp 20   Ht 6\' 2"  (1.88 m)   Wt 91 kg   SpO2 100%   BMI 25.76 kg/m  Physical Exam General: wdwn 30 year old  Cardiac: normal rate Lungs: clear Psych:   ED Course / MDM  EKG:   I have reviewed the labs performed to date as well as medications administered while in observation.  Recent changes in the last 24 hours include none .  Plan  Current plan is for social work is trying to establish living arrangements  Patient is not under full IVC at this time.   37 10/24/20 12/24/20    [id number], MD 10/25/20 1030

## 2020-10-24 NOTE — ED Notes (Signed)
Patient refused to let me perform his vital signs this evening as well, could not even touch patient he is very irritable and is not letting me do it.

## 2020-10-24 NOTE — ED Notes (Signed)
Patient very agitated  Security had to hold the patient in the bed.  Sitter at bedside

## 2020-10-24 NOTE — ED Notes (Signed)
Patient awake sitting on side of bed alternating standing on side of door. Occasionally, patient makes non-verbal outbursts while patient remains calm. Sitter at bedside

## 2020-10-25 MED ORDER — ZIPRASIDONE MESYLATE 20 MG IM SOLR
20.0000 mg | Freq: Once | INTRAMUSCULAR | Status: AC
  Administered 2020-10-25: 20 mg via INTRAMUSCULAR
  Filled 2020-10-25: qty 20

## 2020-10-25 MED ORDER — STERILE WATER FOR INJECTION IJ SOLN
INTRAMUSCULAR | Status: AC
  Filled 2020-10-25: qty 10

## 2020-10-25 NOTE — ED Provider Notes (Signed)
Emergency Medicine Observation Re-evaluation Note  Paul Hendricks is a 30 y.o. male, seen on rounds today.  Pt initially presented to the ED for complaints of No chief complaint on file. Currently, the patient is pacing in room   Physical Exam  BP (!) 125/105 (BP Location: Right Wrist)   Pulse 86   Temp 97.6 F (36.4 C) (Oral)   Resp 18   Ht 6\' 2"  (1.88 m)   Wt 91 kg   SpO2 100%   BMI 25.76 kg/m  Physical Exam pacing, moves all extremities  General: normal  Cardiac:heart rate stable Lungs: normal resp Psych: unchanged since yesterday   ED Course / MDM  EKG:   I have reviewed the labs performed to date as well as medications administered while in observation.  Recent changes in the last 24 hours include none  Plan  Current plan is for Social work is looking for placemtn for pt Patient is not under full IVC at this time.   10/25/20 1014    12/25/20, MD 10/25/20 1253

## 2020-10-25 NOTE — ED Notes (Signed)
Patient is refusing vitals at this time. Respirations were 18. Patient appears to be in no distress, even breathing. Will continue to monitor.

## 2020-10-25 NOTE — ED Notes (Signed)
Pt is nonverbal. Pt makes loud noise. Walks around room.  Anxious.  Pt has period of agitation.  Biting hand, hit window on door.  PRN Geodon 20 mg given , effective

## 2020-10-25 NOTE — ED Notes (Signed)
Pt digging nail in forearm, making nail marks and breaking skin. Unable to redirect from pinching self.

## 2020-10-26 MED ORDER — ZIPRASIDONE MESYLATE 20 MG IM SOLR
INTRAMUSCULAR | Status: AC
  Administered 2020-10-26: 20 mg via INTRAMUSCULAR
  Filled 2020-10-26: qty 20

## 2020-10-26 MED ORDER — ZIPRASIDONE MESYLATE 20 MG IM SOLR: 20.0000 mg | Freq: Once | INTRAMUSCULAR | Status: AC

## 2020-10-26 NOTE — ED Notes (Signed)
Patient very agitated when trying to get VS. Unable to document VS at this time.

## 2020-10-26 NOTE — ED Notes (Signed)
Pt is up, seems to be agitated as evidenced by banging on the glass window. Pt at door moaning loudly, pacing back and forth.

## 2020-10-26 NOTE — ED Notes (Signed)
Patient very agitated this morning. EDP informed. Verbally ordered Geodon. Geodon given. Will continue to monitor the patient.

## 2020-10-26 NOTE — ED Provider Notes (Signed)
Emergency Medicine Observation Re-evaluation Note  Hugh Kamara is a 30 y.o. male, seen on rounds today.  Pt initially presented to the ED for complaints of No chief complaint on file. Currently, the patient is animated, awake, alert, speaking nonsensically.  He did receive Geodon overnight.  Physical Exam  BP (!) 134/120 (BP Location: Right Wrist)   Pulse 90   Temp 98 F (36.7 C) (Oral)   Resp 20   Ht 6\' 2"  (1.88 m)   Wt 91 kg   SpO2 100%   BMI 25.76 kg/m  Physical Exam General: No distress, awake, alert, ambulatory Cardiac: Regular rate and rhythm Lungs: No increased work of breathing Psych: Not insightful into his presentation, minimally directable, agitated  ED Course / MDM  EKG:   I have reviewed the labs performed to date as well as medications administered while in observation.  Recent changes in the last 24 hours include no notable.  Plan  Current plan is for placement. Patient is not under full IVC at this time.   , MD 10/26/20 [phone]

## 2020-10-26 NOTE — ED Provider Notes (Signed)
Patient refused meds per nursing staff.   Koleen Distance, MD 10/26/20 [phone]

## 2020-10-27 MED ORDER — LORAZEPAM 2 MG/ML IJ SOLN
1.0000 mg | Freq: Once | INTRAMUSCULAR | Status: AC
  Administered 2020-10-28: 1 mg via INTRAMUSCULAR
  Filled 2020-10-27: qty 1

## 2020-10-27 MED ORDER — ZIPRASIDONE MESYLATE 20 MG IM SOLR
20.0000 mg | Freq: Once | INTRAMUSCULAR | Status: AC
  Administered 2020-10-27: 20 mg via INTRAMUSCULAR
  Filled 2020-10-27: qty 20

## 2020-10-27 MED ORDER — STERILE WATER FOR INJECTION IJ SOLN
INTRAMUSCULAR | Status: AC
  Administered 2020-10-27: 10 mL
  Filled 2020-10-27: qty 10

## 2020-10-27 NOTE — ED Notes (Signed)
Patient refused vital signs 

## 2020-10-27 NOTE — ED Notes (Signed)
Patient has been up the entire night making inaudible sounds.

## 2020-10-27 NOTE — ED Provider Notes (Signed)
Patient is waiting placement for group home.  But was contacted by the social worker feeling that group homes are unlikely to take him requesting psychiatric consultation.  Which has been ordered.  In addition patient refusing to take his p.o. meds today.  P.o. meds include Geodon 60 mg twice daily.  We will order Geodon 20 mg IM as needed.   Vanetta Mulders, MD 10/27/20 [phone]

## 2020-10-27 NOTE — ED Provider Notes (Signed)
Patient cleared by behavioral health.  Social worker wanted behavioral health evaluation since there is been difficulty with getting group home to take him.   Vanetta Mulders, MD 10/27/20 1336
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
## 2020-10-27 NOTE — Progress Notes (Signed)
TOC CSW received a call from [name] [name]/LME.  [name] is in need of assistance with paperwork requested.    Derrick Tiegs Tarpley-Carter, [name], LCSW-A Pronouns:  She, Her, Hers                  [name] Transitions of CareClinical Social Worker [email or web address] [phone]

## 2020-10-27 NOTE — Consult Note (Signed)
Seen and evaluated face to face by this provider. Unengaged throughout interview. Nonverbal.  No eye contact. Moans randomly. Remains psychiatrically cleared. Will place [facility] consult for disposition determination.

## 2020-10-27 NOTE — ED Provider Notes (Addendum)
Emergency Medicine Observation Re-evaluation Note  Paul Hendricks is a 30 y.o. male, seen on rounds today.  Pt initially presented to the ED for complaints of No chief complaint on file. Currently, the patient is awaiting group home placement.  The group home is not willing to accept without psychiatric evaluation.  That has been ordered.  Patient currently not agitated.  But has been agitated.  Has needed Geodon.  Patient refused to take any of his meds today.  So IM Geodon was ordered and was given today as well.  Physical Exam  BP (!) 138/95 (BP Location: Right Wrist)   Pulse 88   Temp 98.8 F (37.1 C) (Oral)   Resp 20   Ht 1.88 m (6\' 2" )   Wt 91 kg   SpO2 100%   BMI 25.76 kg/m  Physical Exam General: Currently not agitated Cardiac: rrr Lungs: No acute respiratory distress Psych: Actively awaiting a group home placement.  But has had a lot of agitation.  Did require Geodon yesterday.  ED Course / MDM  EKG:   I have reviewed the labs performed to date as well as medications administered while in observation.  Recent changes in the last 24 hours include .  Plan  Current plan is for psychiatric eval since group home would not take him as is without psychiatric evaluation. Patient is not under full IVC at this time.   , MD 10/27/20 1234    12/27/20, MD 10/27/20 1234

## 2020-10-27 NOTE — Progress Notes (Signed)
TOC CSW was informed by Camellia Smith/DSS APS that she was unable to speak with pt, due to his disposition of being nonverbal and his behaviors.  Pt has been throwing things and has been agitated, therefore Camellia was not able to speak with pt.  CSW asked EDP for a TTS eval.  Pt will be difficult to place due to his behaviors, and previous group home stated she would consider taking pt back once he has been placed inpt somewhere for evaluation and treatment.  Mikolaj Woolstenhulme Tarpley-Carter, [name], LCSW-A Pronouns:  She, Her, Hers                  [name] Transitions of CareClinical Social Worker [email or web address] [phone]

## 2020-10-27 NOTE — Progress Notes (Signed)
TOC CSW spoke with Paul Hendricks/[place] [facility] APS.  Paul asked CSW for information on pts situation, and is currently coming here to do an assessment and visit with pt.  CSW brought Paul current on pts situation.  Paul Hendricks, [name], LCSW-A Pronouns:  She, Her, Hers                  [name] Transitions of CareClinical Social Worker [email or web address] [phone]

## 2020-10-27 NOTE — Progress Notes (Signed)
TOC CSW received an email from [employer].  See below:   From: Val Eagle  Sent: Monday, October 27, 2020 11:42 AM To: [email or web address]; [email or web address]; [email or web address] Cc: [email or web address]; Kristopher Oppenheim [email or web address]>; Dana Allan [email or web address]> Subject: FW: Information on GM  Great morning Team [name];  I am forwarding you the email I received from the provider [facility] regarding Zariah's residential placement.  I don't know what to do except to keep on contacting providers to see if there is an open placement and would they agree to provide [name] services.  I am in the process of making the referral to [facility] for a [employer] and developing and isp update for [employer].  I will continue to update Team Ahmari of my efforts.  Have a great blessed day!  Thanks!   [name] [name], QP I/DD Care [facility] [address] [place], [place]  [address] Office: [phone]  Fax: [phone]  Cell: [phone]  Email: [email or web address] Website: [email or web address] [employer] 24/7/365 Call Center: 760-563-6456 Toll-free Behavioral Health Crisis Line: [phone] For free, confidential and anonymous web-based screenings, visit: [email or web address]  From: [facility] [email or web address]>  Sent: Monday, October 27, 2020 10:43 AM To: Val Eagle [email or web address]> Subject: Fwd: Information on GM   Sent from my iPhone  Begin forwarded message: From: [facility] [email or web address]> Date: Oct 17, 2020 at 1:51:25 PM EDT To: "[name], [name]" [email or web address]> Subject: Re: Information on GM Good afternoon Miss [name] me and my team Eulah Citizen an opportunity to review the information you sent over. At this time it seems that the individual requires  a lot more than we're capable of handling and I do not think we would be able to support the individual in a way that he needs to be supported so at this time I would be denying the referral thank you so much please keep [place] in mind in the future case anything else comes across your desk I do not mind a individual that has some behaviors but to the extent of what this person has it just will be a lot more than we could handle thank you. Sent from my iPhone  On Oct 16, 2020, at 2:36 PM, [name], [name] [email or web address]> wrote:  Randie Heinz afternoon Ms. [place] Scrape,   Attached is a copy of my client's behavior and documentation leading up to referral to [facility].  Please get back with me asap once you have reviewed the documentation.  Have a great day!   Thanks!   [name] [name], QP I/DD Care [facility] [address] [place], [place]  [address] Office: [phone]  Fax: [phone]  Cell: [phone]  Email: [email or web address] Website: [email or web address] 24/7/365 Call Center: 306-781-3854 Toll-free Behavioral Health Crisis Line: (971)630-4627 For free, confidential and anonymous web-based screenings, visit: [email or web address]   Hildy Nicholl Tarpley-Carter, [name], LCSW-A Pronouns:  She, Her, Hers                  [name] Transitions of CareClinical Social Worker [email or web address] [phone]

## 2020-10-28 MED ORDER — LORAZEPAM 2 MG/ML IJ SOLN
1.0000 mg | Freq: Once | INTRAMUSCULAR | Status: AC | PRN
  Administered 2020-10-29: 1 mg via INTRAMUSCULAR
  Filled 2020-10-28 (×2): qty 1

## 2020-10-28 NOTE — ED Notes (Signed)
Pt rushed out of room at [facility]HT. MHT blocked pt from grabbing. Pt yelling. Pt redirected to bed. Pt began biting self and trying to hit staff when redirected. Pt continues to try to hit at staff and security.

## 2020-10-28 NOTE — Progress Notes (Signed)
TOC CSW received the following email from [facility].  Pt has been accepted to [facility] in [place], [place].  From: [name], [name] [email or web address]>  Sent: Tuesday, October 28, 2020 4:41 PM To: [email or web address]; [email or web address]; [email or web address] Cc: [email or web address]; Jarold Motto, [email or web address] [email or web address]>; Tarpley-Carter, Amine Adelson [email or web address]>; Kristopher Oppenheim [email or web address]>; Dana Allan [email or web address]> Subject: Hallelujah!!!! We have found a provider for [employer]!!   This message was sent securely using Zix    *Caution - External email - see footer for warnings* Great blessed afternoon Team [place];  I have great news.  We have a provider who is willing to give Alexandra a chance.  The provider's name is [facility] in [place], [place].  The owner is Dr. Elio Forget.  There is a day support program, [employer] in [place] area.  I will email you back tomorrow when Dr. Cheree Ditto emails me documentation of his acceptance of [facility]. We will have an emergency treatment team meeting to develop Chayce's isp update for all his services once we work out things with [facility].  This has made my day, month and possibly my year.  Have a great evening and restful night.    Thanks!  [name] [name], QP I/DD Care [facility] [address] [place], [place]  [address] Office: [phone]  Fax: [phone]  Cell: [phone]  Email: [email or web address] Website: www.[email or web address].[email or web address] Distance 24/7/365 Call Center: [phone] Toll-free Behavioral Health Crisis Line: [phone] For free, confidential and anonymous web-based screenings, visit: [email or web address].pl  CONFIDENTIALITY NOTICE: This message and any attachments included are from [facility] and are for sole use by the intended  recipient(s). The information contained herein may include confidential or privileged information. Unauthorized review, forwarding, printing, copying, distributing, or using such information is strictly prohibited and may be unlawful. If you received this message in error, or have reason to believe you are not authorized to receive it, please contact the sender by reply email and destroy all copies of the original message. Please be advised that any e-mail sent to and from this e-mail account is subject to the [facility] Public Records Law and may be disclosed to third parties. To report fraud, waste and abuse, call the Medicaid tip-line at1-877-DMA-TIP1 (661-542-6836). Your call will remain anonymous.  WARNING: This email originated outside of [facility]. Even if this looks like a [employer], it is not. Do not provide your username, password, or any other personal information in response to this or any other email. [employer] will never ask you for your username or password via email. DO NOT CLICK links or attachments unless you are positive the content is safe. If in doubt about the safety of this message, select the C[employer] Report Phishing button, which forwards to IT Security.   Lilliemae Fruge Tarpley-Carter, [name], LCSW-A Pronouns:  She, Her, Hers                  [name] Transitions of CareClinical Social Worker [email or web address] [phone]
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
## 2020-10-28 NOTE — ED Notes (Signed)
Threw lunch tray in trash.
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
## 2020-10-28 NOTE — ED Notes (Signed)
Patient refused vital signs tonight.
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
## 2020-10-28 NOTE — ED Notes (Signed)
Patient yelling and swinging at staff. Moved patient to a quite environment. Patient began hitting window with fist. Two nurses assisted to get patient to take medications. Security also assisted with med administration. Patient moved back to him room. Patient laid down and went to sleep.
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
## 2020-10-28 NOTE — ED Provider Notes (Signed)
Emergency Medicine Observation Re-evaluation Note  Paul Hendricks is a 30 y.o. male, seen on rounds today.  Pt initially presented to the ED for complaints of No chief complaint on file. Currently, the patient is at his baseline, intermittent episodes of agitation. Not coherent with speech.  Physical Exam  BP (!) 138/95 (BP Location: Right Wrist)   Pulse 88   Temp 98.8 F (37.1 C) (Oral)   Resp 20 Comment: sleeping  Ht 6\' 2"  (1.88 m)   Wt 91 kg   SpO2 100%   BMI 25.76 kg/m  Physical Exam General: Slightly restless, trying to come out of the room multiple times Cardiac: Regular rate Lungs: No respiratory distress Psych: Cooperative, intermittent burst of agitation, alert  ED Course / MDM  EKG:   I have reviewed the labs performed to date as well as medications administered while in observation.  Recent changes in the last 24 hours include : Patient requiring sedation on 2 separate occasions and few occasions of staff directing patient back to his room.  Intermittently, patient has gotten aggressive and swung at our staff.  Plan  Current plan is for for patient to be placed at a group home.  Psychiatry has cleared him. Patient is not under full IVC at this time.   , MD 10/28/20 [phone]

## 2020-10-29 MED ORDER — LORAZEPAM 2 MG/ML IJ SOLN
2.0000 mg | Freq: Once | INTRAMUSCULAR | Status: AC
  Administered 2020-10-29: 2 mg via INTRAMUSCULAR
  Filled 2020-10-29: qty 1

## 2020-10-29 MED ORDER — ZIPRASIDONE MESYLATE 20 MG IM SOLR
20.0000 mg | Freq: Once | INTRAMUSCULAR | Status: AC
  Administered 2020-10-29: 20 mg via INTRAMUSCULAR
  Filled 2020-10-29: qty 20

## 2020-10-29 MED ORDER — STERILE WATER FOR INJECTION IJ SOLN
INTRAMUSCULAR | Status: AC
  Filled 2020-10-29: qty 10

## 2020-10-29 MED ORDER — HYDROXYZINE HCL 50 MG/ML IM SOLN
100.0000 mg | INTRAMUSCULAR | Status: AC | PRN
Start: 2020-10-29 — End: 2020-10-30
  Administered 2020-10-29 – 2020-10-30 (×4): 100 mg via INTRAMUSCULAR
  Filled 2020-10-29 (×4): qty 2

## 2020-10-29 NOTE — ED Provider Notes (Signed)
Emergency Medicine Observation Re-evaluation Note  Paul Hendricks is a 30 y.o. male, seen on rounds today.  Pt initially presented to the ED for complaints of No chief complaint on file. Currently, the patient is awake, clapping hands, singing to self.  Physical Exam  BP (!) 138/95 (BP Location: Right Wrist)   Pulse 88   Temp 98.8 F (37.1 C) (Oral)   Resp 20 Comment: sleeping  Ht 6\' 2"  (1.88 m)   Wt 91 kg   SpO2 100%   BMI 25.76 kg/m  Physical Exam General: awake, clapping hands   ED Course / MDM  EKG:   I have reviewed the labs performed to date as well as medications administered while in observation.  Recent changes in the last 24 hours include intermittently aggressive per staff.  Plan  Current plan is for placement at group home. Patient is not under full IVC at this time.   , MD 10/29/20 [phone]

## 2020-10-29 NOTE — ED Notes (Signed)
Several attempts were made to give patient his medication. Unsuccessful patient physical with staff. Security helped with IM medication administration.

## 2020-10-29 NOTE — ED Notes (Signed)
Pt nonverbal, can not express needs. Call out, make noise continuously. Pt needs redirection.

## 2020-10-29 NOTE — ED Notes (Signed)
Vitals refused.

## 2020-10-29 NOTE — Progress Notes (Signed)
TOC CSW contacted [facility] [phone], [phone], and [phone] in regards to [facility].[name] is currently working on paperwork that will expedite pts admission to [facility].  [name] will continue to update CSW on her progress.  Paul Hendricks, [name], LCSW-A Pronouns:  She, Her, Hers                  [name] ED Transitions of CareClinical Social Worker [email or web address] [phone]

## 2020-10-30 MED ORDER — LORAZEPAM 2 MG/ML IJ SOLN: 2.0000 mg | Freq: Once | INTRAMUSCULAR | Status: DC

## 2020-10-30 MED ORDER — STERILE WATER FOR INJECTION IJ SOLN
INTRAMUSCULAR | Status: AC
  Administered 2020-10-30: 10 mL
  Filled 2020-10-30: qty 10

## 2020-10-30 MED ORDER — STERILE WATER FOR INJECTION IJ SOLN
INTRAMUSCULAR | Status: AC
  Filled 2020-10-30: qty 10

## 2020-10-30 MED ORDER — ZIPRASIDONE MESYLATE 20 MG IM SOLR
20.0000 mg | Freq: Once | INTRAMUSCULAR | Status: AC
  Administered 2020-10-30: 20 mg via INTRAMUSCULAR
  Filled 2020-10-30: qty 20

## 2020-10-30 MED ORDER — LORAZEPAM 2 MG/ML IJ SOLN
INTRAMUSCULAR | Status: AC
  Administered 2020-10-30: 2 mg
  Filled 2020-10-30: qty 1

## 2020-10-30 MED ORDER — LORAZEPAM 2 MG/ML IJ SOLN
2.0000 mg | Freq: Once | INTRAMUSCULAR | Status: AC
  Administered 2020-10-30: 2 mg via INTRAMUSCULAR
  Filled 2020-10-30: qty 1

## 2020-10-30 NOTE — ED Notes (Signed)
Patient placed in seclusion room for safety of patient and staff.
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
## 2020-10-30 NOTE — ED Notes (Signed)
Patient in room beating on window with closed fist.   MD notifed.

## 2020-10-30 NOTE — ED Provider Notes (Signed)
Emergency Medicine Observation Re-evaluation Note  Firas Guardado is a 30 y.o. male, seen on rounds today.  Pt initially presented to the ED for complaints of No chief complaint on file. Currently, the patient is awaiting group home placement.  Patient with severe autism.  Has been very agitated here in the last 24 hours.  Patient received Geodon yesterday during the daytime along with Ativan.  Require Geodon again at midnight.  Again agitated again this morning.  I have ordered Geodon again.  Patient refusing to take any of his oral medications.  Hopefully patient will do better in the group home environment..  Physical Exam  BP (!) 138/95 (BP Location: Right Wrist)   Pulse 88   Temp 98.8 F (37.1 C) (Oral)   Resp 20 Comment: sleeping  Ht 1.88 m (6\' 2" )   Wt 91 kg   SpO2 100%   BMI 25.76 kg/m  Physical Exam General: Patient is agitated but directable for now.  Still refusing to take medications. Cardiac:  Lungs: No acute respiratory distress Psych: Patient agitated  ED Course / MDM  EKG:   I have reviewed the labs performed to date as well as medications administered while in observation.  Recent changes in the last 24 hours include patient has required Geodon not 3 times since yesterday morning.  Due to agitation.  Patient not tolerating the setting very well due to his autism.  Patient was evaluated by behavioral health on Monday.  They felt there was no psychiatric indications.  And was cleared for social worker to find group home placement..  Plan  Current plan is for awaiting group home placement.. Patient is not under full IVC at this time.   Friday, MD 10/30/20 [phone]

## 2020-10-30 NOTE — Progress Notes (Signed)
TOC CSW contacted [facility] [phone], [phone], and [phone] in regards to an update on placement.  Paul Hendricks stated she was out of the office today, but would update CSW first thing on 10/31/2020.  Paul Hendricks, [name], LCSW-A Pronouns:  She, Her, Hers                  Paul [facility]ricks Long ED Transitions of CareClinical Social Worker [email or web address] [phone]

## 2020-10-30 NOTE — ED Notes (Signed)
Patient beating on window in room with closed fist.  Security at bedside.
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
## 2020-10-30 NOTE — ED Notes (Signed)
Pt reach in his butt and get some of his feces and put it all over the door, pt also put his feces all over his hands and lower parts of his body.

## 2020-10-30 NOTE — ED Notes (Addendum)
Patient continues to be physically aggressive toward staff.  He also continues to punch the glass window in his room.  Patient also punched a hole in the wall.

## 2020-10-30 NOTE — ED Notes (Signed)
Pt nonverbal . Medication non compliant. Pt has been agitated this shift. Needs redirection. Trying to hit at times Calling out.  Pt biting hand. Pt redirected and reassured .  PRN s given ( see MAR).

## 2020-10-30 NOTE — Progress Notes (Signed)
TOC CSW received an email from [facility] in regards to pts placement.  See the attached.    From: Val Eagle [email or web address]>  Sent: Wednesday, October 29, 2020 6:27 PM To: Torres, Myrta E [email or web address]>; Denton Ar [email or web address]> Cc: [email or web address]; [email or web address]; Jarold Motto, [email or web address] [email or web address]>; Tarpley-Carter, Alisan Dokes [email or web address]>; Kristopher Oppenheim [email or web address]> Subject: RE: Hallelujah!!!! We have found a provider for [employer]!!  This message was sent securely using Zix    *Caution - External email - see footer for warnings* Great evening Team [place];  All I can say is God is GOOD.  Just got a voicemail from Mr. [name] with [facility] and they are also willing to give [facility] chance. So we will have a option to choice fom.     [facility] with Dr. Cheree Ditto is in [place], [place]  [facility] I'm not sure where but he did say the placement he has is in the [place] catchment area.  I will have more details for Team [facility] tomorrow once I am able to talk with both of them tomorrow morning.  Have a great blessed evening!   Thanks!  [name] [name], QP I/DD Care [facility] [address] [place], [place]  [address] Office: [phone]  Fax: [phone]  Cell: [phone]  Email: [email or web address] Website: www.[email or web address] Distance 24/7/365 Call Center: [phone] Toll-free Behavioral Health Crisis Line: 6411118962 For free, confidential and anonymous web-based screenings, visit: www.[email or web address].pl   Peyson Postema Tarpley-Carter, [name], LCSW-A Pronouns:  She, Her, Hers                  [name] Transitions of CareClinical Social Worker [email or web address] [phone]

## 2020-10-30 NOTE — ED Notes (Signed)
Patient punching glass window with closed fist.  Patient able to be redirected by staff.

## 2020-10-30 NOTE — ED Notes (Signed)
Patient physically aggressive toward staff.  Patient refusing medications.  MD notified.

## 2020-10-31 DIAGNOSIS — F72 Severe intellectual disabilities: Secondary | ICD-10-CM | POA: Diagnosis not present

## 2020-10-31 DIAGNOSIS — R569 Unspecified convulsions: Secondary | ICD-10-CM

## 2020-10-31 DIAGNOSIS — F840 Autistic disorder: Secondary | ICD-10-CM | POA: Diagnosis not present

## 2020-10-31 DIAGNOSIS — F39 Unspecified mood [affective] disorder: Secondary | ICD-10-CM

## 2020-10-31 MED ORDER — CHLORPROMAZINE HCL 25 MG/ML IJ SOLN
50.0000 mg | Freq: Two times a day (BID) | INTRAMUSCULAR | Status: DC
  Administered 2020-10-31 – 2020-11-17 (×29): 50 mg via INTRAMUSCULAR
  Filled 2020-10-31 (×36): qty 2

## 2020-10-31 MED ORDER — DIPHENHYDRAMINE HCL 50 MG/ML IJ SOLN
25.0000 mg | Freq: Once | INTRAMUSCULAR | Status: AC
  Administered 2020-10-31: 25 mg via INTRAMUSCULAR
  Filled 2020-10-31: qty 1

## 2020-10-31 MED ORDER — DIPHENHYDRAMINE HCL 50 MG/ML IJ SOLN
25.0000 mg | Freq: Once | INTRAMUSCULAR | Status: AC
  Administered 2020-10-31: 25 mg via INTRAMUSCULAR

## 2020-10-31 MED ORDER — LORAZEPAM 2 MG/ML IJ SOLN
2.0000 mg | Freq: Once | INTRAMUSCULAR | Status: AC
  Administered 2020-10-31: 2 mg via INTRAMUSCULAR
  Filled 2020-10-31: qty 1

## 2020-10-31 MED ORDER — DIPHENHYDRAMINE HCL 50 MG/ML IJ SOLN
25.0000 mg | Freq: Two times a day (BID) | INTRAMUSCULAR | Status: DC
  Administered 2020-10-31 – 2020-11-17 (×30): 25 mg via INTRAMUSCULAR
  Filled 2020-10-31 (×32): qty 1

## 2020-10-31 MED ORDER — RISPERIDONE 0.5 MG PO TBDP
1.0000 mg | ORAL_TABLET | Freq: Two times a day (BID) | ORAL | Status: DC
  Administered 2020-11-01 – 2021-05-11 (×345): 1 mg via ORAL
  Filled 2020-10-31: qty 2
  Filled 2020-10-31 (×2): qty 1
  Filled 2020-10-31 (×2): qty 2
  Filled 2020-10-31: qty 1
  Filled 2020-10-31: qty 2
  Filled 2020-10-31: qty 1
  Filled 2020-10-31: qty 2
  Filled 2020-10-31: qty 1
  Filled 2020-10-31 (×3): qty 2
  Filled 2020-10-31 (×2): qty 1
  Filled 2020-10-31 (×2): qty 2
  Filled 2020-10-31: qty 1
  Filled 2020-10-31: qty 2
  Filled 2020-10-31: qty 1
  Filled 2020-10-31: qty 2
  Filled 2020-10-31: qty 1
  Filled 2020-10-31: qty 2
  Filled 2020-10-31: qty 1
  Filled 2020-10-31 (×2): qty 2
  Filled 2020-10-31: qty 1
  Filled 2020-10-31: qty 2
  Filled 2020-10-31: qty 1
  Filled 2020-10-31 (×2): qty 2
  Filled 2020-10-31: qty 1
  Filled 2020-10-31: qty 2
  Filled 2020-10-31 (×2): qty 1
  Filled 2020-10-31 (×3): qty 2
  Filled 2020-10-31: qty 1
  Filled 2020-10-31 (×2): qty 2
  Filled 2020-10-31: qty 1
  Filled 2020-10-31: qty 2
  Filled 2020-10-31 (×2): qty 1
  Filled 2020-10-31: qty 2
  Filled 2020-10-31 (×4): qty 1
  Filled 2020-10-31 (×2): qty 2
  Filled 2020-10-31: qty 1
  Filled 2020-10-31 (×3): qty 2
  Filled 2020-10-31: qty 1
  Filled 2020-10-31: qty 2
  Filled 2020-10-31: qty 1
  Filled 2020-10-31 (×2): qty 2
  Filled 2020-10-31: qty 1
  Filled 2020-10-31: qty 2
  Filled 2020-10-31 (×2): qty 1
  Filled 2020-10-31 (×2): qty 2
  Filled 2020-10-31: qty 1
  Filled 2020-10-31: qty 2
  Filled 2020-10-31: qty 1
  Filled 2020-10-31 (×3): qty 2
  Filled 2020-10-31 (×4): qty 1
  Filled 2020-10-31 (×3): qty 2
  Filled 2020-10-31: qty 1
  Filled 2020-10-31 (×2): qty 2
  Filled 2020-10-31 (×2): qty 1
  Filled 2020-10-31: qty 2
  Filled 2020-10-31: qty 1
  Filled 2020-10-31 (×5): qty 2
  Filled 2020-10-31: qty 1
  Filled 2020-10-31: qty 2
  Filled 2020-10-31 (×4): qty 1
  Filled 2020-10-31 (×2): qty 2
  Filled 2020-10-31: qty 1
  Filled 2020-10-31: qty 2
  Filled 2020-10-31: qty 1
  Filled 2020-10-31 (×8): qty 2
  Filled 2020-10-31 (×5): qty 1
  Filled 2020-10-31: qty 2
  Filled 2020-10-31 (×2): qty 1
  Filled 2020-10-31 (×8): qty 2
  Filled 2020-10-31: qty 1
  Filled 2020-10-31 (×3): qty 2
  Filled 2020-10-31 (×6): qty 1
  Filled 2020-10-31: qty 2
  Filled 2020-10-31 (×2): qty 1
  Filled 2020-10-31 (×2): qty 2
  Filled 2020-10-31 (×2): qty 1
  Filled 2020-10-31 (×2): qty 2
  Filled 2020-10-31: qty 1
  Filled 2020-10-31: qty 2
  Filled 2020-10-31 (×3): qty 1
  Filled 2020-10-31 (×2): qty 2
  Filled 2020-10-31: qty 1
  Filled 2020-10-31 (×3): qty 2
  Filled 2020-10-31: qty 1
  Filled 2020-10-31 (×2): qty 2
  Filled 2020-10-31 (×2): qty 1
  Filled 2020-10-31 (×3): qty 2
  Filled 2020-10-31 (×2): qty 1
  Filled 2020-10-31: qty 2
  Filled 2020-10-31: qty 1
  Filled 2020-10-31 (×2): qty 2
  Filled 2020-10-31 (×2): qty 1
  Filled 2020-10-31: qty 2
  Filled 2020-10-31 (×2): qty 1
  Filled 2020-10-31: qty 2
  Filled 2020-10-31: qty 1
  Filled 2020-10-31: qty 2
  Filled 2020-10-31 (×6): qty 1
  Filled 2020-10-31: qty 2
  Filled 2020-10-31: qty 1
  Filled 2020-10-31 (×5): qty 2
  Filled 2020-10-31: qty 1
  Filled 2020-10-31 (×2): qty 2
  Filled 2020-10-31: qty 1
  Filled 2020-10-31: qty 2
  Filled 2020-10-31 (×3): qty 1
  Filled 2020-10-31: qty 2
  Filled 2020-10-31: qty 1
  Filled 2020-10-31: qty 2
  Filled 2020-10-31: qty 1
  Filled 2020-10-31 (×4): qty 2
  Filled 2020-10-31 (×3): qty 1
  Filled 2020-10-31 (×2): qty 2
  Filled 2020-10-31: qty 1
  Filled 2020-10-31 (×4): qty 2
  Filled 2020-10-31: qty 1
  Filled 2020-10-31: qty 2
  Filled 2020-10-31: qty 1
  Filled 2020-10-31: qty 2
  Filled 2020-10-31: qty 1
  Filled 2020-10-31: qty 2
  Filled 2020-10-31: qty 1
  Filled 2020-10-31 (×3): qty 2
  Filled 2020-10-31: qty 1
  Filled 2020-10-31 (×2): qty 2
  Filled 2020-10-31 (×3): qty 1
  Filled 2020-10-31 (×3): qty 2
  Filled 2020-10-31 (×2): qty 1
  Filled 2020-10-31: qty 2
  Filled 2020-10-31 (×2): qty 1
  Filled 2020-10-31 (×3): qty 2
  Filled 2020-10-31 (×2): qty 1
  Filled 2020-10-31 (×2): qty 2
  Filled 2020-10-31 (×2): qty 1
  Filled 2020-10-31: qty 2
  Filled 2020-10-31 (×2): qty 1
  Filled 2020-10-31 (×3): qty 2
  Filled 2020-10-31 (×2): qty 1
  Filled 2020-10-31: qty 2
  Filled 2020-10-31: qty 1
  Filled 2020-10-31: qty 2
  Filled 2020-10-31: qty 1
  Filled 2020-10-31 (×2): qty 2
  Filled 2020-10-31: qty 1
  Filled 2020-10-31 (×2): qty 2
  Filled 2020-10-31: qty 1
  Filled 2020-10-31 (×5): qty 2
  Filled 2020-10-31 (×3): qty 1
  Filled 2020-10-31 (×2): qty 2
  Filled 2020-10-31 (×2): qty 1
  Filled 2020-10-31 (×2): qty 2
  Filled 2020-10-31: qty 1
  Filled 2020-10-31 (×2): qty 2
  Filled 2020-10-31: qty 1
  Filled 2020-10-31 (×4): qty 2
  Filled 2020-10-31: qty 1
  Filled 2020-10-31: qty 2
  Filled 2020-10-31: qty 1
  Filled 2020-10-31: qty 2
  Filled 2020-10-31: qty 1
  Filled 2020-10-31 (×4): qty 2
  Filled 2020-10-31: qty 1
  Filled 2020-10-31: qty 2
  Filled 2020-10-31: qty 1
  Filled 2020-10-31: qty 2
  Filled 2020-10-31 (×2): qty 1
  Filled 2020-10-31: qty 2
  Filled 2020-10-31: qty 1
  Filled 2020-10-31: qty 2
  Filled 2020-10-31: qty 1
  Filled 2020-10-31: qty 2
  Filled 2020-10-31 (×2): qty 1
  Filled 2020-10-31: qty 2
  Filled 2020-10-31: qty 1
  Filled 2020-10-31 (×4): qty 2
  Filled 2020-10-31: qty 1
  Filled 2020-10-31: qty 2
  Filled 2020-10-31: qty 1
  Filled 2020-10-31 (×2): qty 2
  Filled 2020-10-31: qty 1
  Filled 2020-10-31 (×2): qty 2
  Filled 2020-10-31: qty 1
  Filled 2020-10-31 (×2): qty 2
  Filled 2020-10-31: qty 1
  Filled 2020-10-31 (×3): qty 2
  Filled 2020-10-31 (×2): qty 1
  Filled 2020-10-31 (×3): qty 2
  Filled 2020-10-31: qty 1
  Filled 2020-10-31 (×2): qty 2
  Filled 2020-10-31: qty 1
  Filled 2020-10-31 (×4): qty 2
  Filled 2020-10-31: qty 1
  Filled 2020-10-31: qty 2
  Filled 2020-10-31: qty 1
  Filled 2020-10-31: qty 2
  Filled 2020-10-31: qty 1
  Filled 2020-10-31 (×4): qty 2
  Filled 2020-10-31: qty 1
  Filled 2020-10-31: qty 2
  Filled 2020-10-31 (×2): qty 1
  Filled 2020-10-31 (×2): qty 2
  Filled 2020-10-31 (×2): qty 1
  Filled 2020-10-31 (×10): qty 2
  Filled 2020-10-31: qty 1
  Filled 2020-10-31: qty 2
  Filled 2020-10-31 (×2): qty 1

## 2020-10-31 MED ORDER — HALOPERIDOL LACTATE 5 MG/ML IJ SOLN
5.0000 mg | Freq: Four times a day (QID) | INTRAMUSCULAR | Status: DC | PRN
  Administered 2020-10-31 – 2021-05-11 (×146): 5 mg via INTRAMUSCULAR
  Filled 2020-10-31 (×172): qty 1

## 2020-10-31 MED ORDER — ZIPRASIDONE MESYLATE 20 MG IM SOLR
10.0000 mg | Freq: Once | INTRAMUSCULAR | Status: AC
  Administered 2020-11-02: 10 mg via INTRAMUSCULAR
  Filled 2020-10-31: qty 20

## 2020-10-31 MED ORDER — DROPERIDOL 2.5 MG/ML IJ SOLN
5.0000 mg | Freq: Once | INTRAMUSCULAR | Status: AC
  Administered 2020-10-31: 5 mg via INTRAMUSCULAR
  Filled 2020-10-31: qty 2

## 2020-10-31 NOTE — BH Assessment (Addendum)
BHH Assessment Progress Note   At the request of Nelly Rout, MD, this writer called pt's [facility] Coordinator to obtain information about pt's history to assist with managing his behavior.  After reviewing pt's record I was unable to find the following documents under the Media tab:  1) Letter of Guardianship 2) Care Plan 3) Psychometric Testing with IQ  At 12:31 I called [name] [name] at [phone].  [name] reports that she will find these items as available, along with 60 day notice from pt's group home, and will send them to me and to Ricquita Tarpley-Carter, LCSWA by e-mail in about an hour.  Receipt is pending as of this writing.  Doylene Canning, [place] Behavioral Health Coordinator [phone]    Addendum:  This Clinical research associate has received documents noted above along with 60 day notice from group home dated 08/22/2020.  A copy has been placed on pt's chart and another copy has been labeled to send to medical records.  The following individuals have been notified: Nelly Rout, MD; Maxie Barb, NP; Isidoro Donning, RN; Ricquita Tarpley-Carter, LCSWA; Addison Naegeli, RN; and Tia Alert, RN.  Doylene Canning, [place] Behavioral Health Coordinator [phone]

## 2020-10-31 NOTE — ED Provider Notes (Signed)
Emergency Medicine Observation Re-evaluation Note  Paul Hendricks is a 30 y.o. male, seen on rounds today.  Pt initially presented to the ED for complaints of No chief complaint on file. Currently, the patient is agitated, pacing the room, banging head on door.  Physical Exam  BP (!) 133/107 (BP Location: Right Arm)   Pulse 85   Temp 98.8 F (37.1 C) (Oral)   Resp (!) 22   Ht 6\' 2"  (1.88 m)   Wt 91 kg   SpO2 100%   BMI 25.76 kg/m  Physical Exam General: Awake, alert Lungs: even and unlabored Psych: agitated, banging head on wall  ED Course / MDM  EKG:   I have reviewed the labs performed to date as well as medications administered while in observation.  Recent changes in the last 24 hours include multiple calls for agitation, has not been taking oral meds despite multiple attempts to put them in his food. Has refused to eat.   Plan  Current plan is for Group Home placement. Will consult Psych for medication recommendations. Patient is not under full IVC at this time.   , MD 10/31/20 [phone]

## 2020-10-31 NOTE — ED Notes (Addendum)
Patient naked in room beating on the window with closed fist.

## 2020-10-31 NOTE — Progress Notes (Signed)
TOC CSW was informed by [name] [name]/[facility] that pts placement committee will meet with prospective group home in regards to specific date of placement on 11/03/2020.    Paul Hendricks, [name], LCSW-A Pronouns:  She, Her, Hers                  [name] ED Transitions of CareClinical Social Worker [email or web address] [phone]

## 2020-10-31 NOTE — ED Notes (Signed)
Pt tachycardic at 138. Unable to successfully obtain oral or axillary temp at this time. Pt increasingly agitated. Provider made aware

## 2020-10-31 NOTE — ED Notes (Signed)
Patient banging head against glass door.  Redirected successfully by [employer]

## 2020-10-31 NOTE — ED Notes (Signed)
Unable to get vital signs due to pts violent behavior at this time

## 2020-10-31 NOTE — BH Assessment (Signed)
This Probation officer met with patient earlier this date to assess current mental health status. Patient was residing in a group home and was dropped off after a witnessed seizure on 10/23/20. Patient is autistic and non verbal. Patient is observed to be agitated and difficult to redirect. Patient per nurse has been extremely agitated the last 24 hours with episodes of head banging in his room and acting out towards staff. Per notes patient will be evaluated for further medication management as patient contnues to be reviewed by facilities for placement.

## 2020-10-31 NOTE — Consult Note (Signed)
  Paul Hendricks is a 30 year old male who presented to the ED after a witnessed seizure. Apparently his previous group home dropped him off to the ED, with no intentions of picking him back up after being medically cleared. Chart review shows they submitted a 60 day notice, and advised multiple providers this date was coming up in which they were advised to bring him to the ED. Patient has a history of severe intellectual deficits, autism spectrum disorder, mood disorder, and seizures since childhood. He also has a history of head banging, and language deficits since a child and can become very aggressive. He is predominantly non verbal, and can point and sign a little bit to indicate his needs. Chart review indicates he was with his previous group home for about 15 years. 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   Psych consult placed for medication management as he does not appear to be responding to traditional medications.  -Will adjust medications to include discontinuation of Geodon. WIll start Risperdal M tab 1mg  po BID. At this time due to his aggressive nature and increasingly self harm will start THorazine 50mg  IM BID and Benadryl 50mg  IM BID to be administered with Thorazine.  -Will order labs, and have TTS provider to begin following patient daily to prevent further complications or risk associated with ASD, self injuries and ED hospitalization.  -Patient is now medically cleared and appears to be housing in the ED until further placement is found.  -Due to worsening symptoms as evident by patient smearing feces on the wall, requiring seclusion, multiple psychotropic medications with little to no response patient will likely benefit from long term hospitalization at a state facility.   -Recommend social work attempt to identify his behavioral health action plan to help reduce his behaviors  during an expected prolong hospitalization stay.  -Continue initiating contact daily with LME and guardian, to advise increased risks that are associated with prolong hospitalization stay. Consider making referral to [facility] as previous provider served a 60 day notice, and no additional placement was found within that time.

## 2020-10-31 NOTE — ED Notes (Signed)
Patient sticking fingers in rectum to retrieve feces.  Patient smearing feces on room wall.  Patient removed from seclusion and showered.

## 2020-10-31 NOTE — Progress Notes (Signed)
TOC CSW received an email from [facility].    From: [name], [name] [email or web address]>  Sent: Friday, October 31, 2020 1:51 PM To: [email or web address] Cc: Torres, Myrta E [email or web address]>; [id number]>; [email or web address]; [email or web address]; Tarpley-Carter, Quientin Jent [email or web address]>; Denton Ar [email or web address]>; Doylene Canning [email or web address]>; Kristopher Oppenheim [email or web address]> Subject: Documentation of acceptance and to schedule Team Demonie's treatment team meeting on Monday  This message was sent securely using Zix    *Caution - External email - see footer for warnings* Great afternoon Dr. Saturday;  Thank you once again for L and J agreeing to provide Residential Level 4 services to [facility].  When I spoke with you on Tuesday evening, you agreed to send me documentation on company letterhead agreeing to provide Residential Level 4 services for [facility].  I know that things have been hectic and you possibly forgot to email me the acceptance documentation.  Can you please send me Khaidyn's acceptance documentation asap.  I would like to schedule a treatment team telephone conference meeting on Monday, 11/03/20 with Team 11/05/20 to discuss Chrisopher's start date, begin the placement process and to develop Rylend's isp update for Residential Level 4 services. Team Vernor needs to be able to provide [facility] with an approximate time Madeline will be able be discharged.   If you have any questions and need to contact me please call me on my cell phone below.   I am working from home today and Monday.  I will be back in the office on Tuesday.  Have a great day and weekend!  Thanks once again!   [name] Saturday, QP I/DD Care [facility] [address] [place], [place]  [place] Office: [phone]  Fax: [phone]  Cell:  [phone]  Email: [email or web address] Website: [email or web address]   Americus Perkey Tarpley-Carter, [name], LCSW-A Pronouns:  She, Her, Hers                  [phone] Long ED Transitions of CareClinical Social Worker [email or web address] [phone]

## 2020-11-01 MED ORDER — LORAZEPAM 2 MG/ML IJ SOLN
2.0000 mg | Freq: Once | INTRAMUSCULAR | Status: AC
  Administered 2020-11-01: 2 mg via INTRAMUSCULAR
  Filled 2020-11-01: qty 1

## 2020-11-01 MED ORDER — STERILE WATER FOR INJECTION IJ SOLN
INTRAMUSCULAR | Status: AC
  Administered 2020-11-01: 1.2 mL
  Filled 2020-11-01: qty 10

## 2020-11-01 MED ORDER — LORAZEPAM 2 MG/ML IJ SOLN
1.0000 mg | Freq: Once | INTRAMUSCULAR | Status: AC
  Administered 2020-11-01: 1 mg via INTRAMUSCULAR
  Filled 2020-11-01: qty 1

## 2020-11-01 MED ORDER — ZIPRASIDONE MESYLATE 20 MG IM SOLR
20.0000 mg | Freq: Once | INTRAMUSCULAR | Status: AC
  Administered 2020-11-01: 20 mg via INTRAMUSCULAR
  Filled 2020-11-01: qty 20
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
## 2020-11-01 NOTE — ED Notes (Signed)
Writer attempted to crush and place po meds in oatmeal. Pt did not eat much. Provider made aware of pt not taking po medications. Pt is yelling and becomes aggressive when writer attempted to give IM medications

## 2020-11-01 NOTE — ED Notes (Addendum)
Pt continues to pace room and bang on doors and windows. Pt repeatedly taking of clothes, requiring constant redirection. Pt HR shows increase at 143, provider made aware

## 2020-11-01 NOTE — ED Provider Notes (Signed)
Emergency Medicine Observation Re-evaluation Note  Paul Hendricks is a 30 y.o. male, seen on rounds today.  Pt initially presented to the ED for complaints of No chief complaint on file. Currently, the patient is becoming increasingly agitating him according to his nurse.  Physical Exam  BP 115/85 (BP Location: Right Arm)   Pulse (!) 110   Temp 98.6 F (37 C) (Axillary)   Resp (!) 22   Ht 6\' 2"  (1.88 m)   Wt 91 kg   SpO2 99%   BMI 25.76 kg/m  Physical Exam He is continually vocalizing, unintelligible words, and is hyperactive at this time.  ED Course / MDM  EKG:   I have reviewed the labs performed to date as well as medications administered while in observation.  Recent changes in the last 24 hours include was evaluated yesterday by psychiatry who made some medication changes including initiation of Thorazine treatment, but IM injection twice a day, Resporal orally twice daily, and Benadryl 50 mg IM twice daily with Thorazine injections, Geodon was discontinued because it does not appear efficacious.  I will order a single dose of as needed Ativan 2 mg IM x1, if needed.  I encouraged nursing to contact psychiatry for any additional medication needs.  They have not yet seen him today but are planning daily contact with the patient to assess his medication needs.  Documentation from [facility] indicates that patient may have a bed placement, on 11/03/2020.  However, that has not been confirmed.  Plan  Current plan is for placement In a residential facility. Patient is not under full IVC at this time.   11/05/2020, MD 11/01/20 1108

## 2020-11-01 NOTE — ED Notes (Signed)
Pt continues to pace room and bang on glass window and door. Pt can be redirected for small spurts of time. Still unable to obtain another full set of vitals as pt refuses and becomes combative. Provider made aware

## 2020-11-01 NOTE — ED Notes (Signed)
Pt refuses to let staff take vitals

## 2020-11-01 NOTE — ED Notes (Signed)
Patient restring after multiple outbursts and needing redirection. Vital signs obtained without disturbing the patient.
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
## 2020-11-01 NOTE — ED Notes (Addendum)
Pt. Refused vitals. Will re-attempt later. Will continue to monitor.

## 2020-11-01 NOTE — ED Notes (Signed)
Pt in room very agitated and trying to get out of the room. Provider aware and meds given per [facility]

## 2020-11-02 MED ORDER — LORAZEPAM 2 MG/ML IJ SOLN
2.0000 mg | Freq: Once | INTRAMUSCULAR | Status: AC
  Administered 2020-11-02: 2 mg via INTRAMUSCULAR
  Filled 2020-11-02: qty 1

## 2020-11-02 MED ORDER — STERILE WATER FOR INJECTION IJ SOLN
INTRAMUSCULAR | Status: AC
  Administered 2020-11-02: 1.2 mL
  Filled 2020-11-02: qty 10

## 2020-11-02 NOTE — ED Notes (Addendum)
Patient is awake and yelling/moaning loudly, visibly appears agitated, and thrashing his arms around. Patient is disturbing the unit.

## 2020-11-02 NOTE — ED Notes (Signed)
Pt alert. Cooperative at times.  Pt ate breakfast. Pt continues to make repetitive calling out.

## 2020-11-02 NOTE — ED Provider Notes (Signed)
Emergency Medicine Observation Re-evaluation Note  Paul Hendricks is a 30 y.o. male, seen on rounds today.  Pt initially presented to the ED for complaints of No chief complaint on file. Currently, the patient is continues to be agitated pacing in room.  Talking incoherently.  This is unchanged from yesterday.  Physical Exam  BP (!) 122/101 (BP Location: Left Arm)   Pulse (!) 124   Temp 98.6 F (37 C) (Axillary)   Resp 18   Ht 1.88 m (6\' 2" )   Wt 91 kg   SpO2 99%   BMI 25.76 kg/m  Physical Exam General: Pacing in the room patient has some scratch marks on his arms.  Patient takes his scrubs off. Skin: With some scratch marks on his upper extremities.  Has some redness to the bicep area on the left.  But patient is moving arm fine.  It is not firm or tense.  Does not appear to cause any pain.  It is not up around the deltoid area where he has been receiving the IM injections.  But we will need to watch this. Psych: Patient talking incoherently pacing in room.  Somewhat agitated.  ED Course / MDM  EKG:   I have reviewed the labs performed to date as well as medications administered while in observation.  Recent changes in the last 24 hours include patient's behavior without any significant change in the last 24 hours.  Plan  Current plan is for TOC placement.  We have requested that behavioral health may be provide some additional medication recommendations patient is receiving IM Ativan and IM Geodon frequently.  In addition patient has some mild redness to the bicep area of the left upper extremity does not appear to be causing any pain.  He moves his arm well.  It is not firm or tense like a compartment syndrome.  But this will need to be continued to be observed. Patient is not under full IVC at this time.   , MD 11/02/20 1721

## 2020-11-02 NOTE — ED Notes (Signed)
Patient out of room, physically aggressive toward staff.

## 2020-11-03 NOTE — Progress Notes (Signed)
TOC CSW spoke with [facility] and other placement team members in regards to pts acceptance to a facility.  John Clapps and Gloris Manchester will be visiting with pt tonight at 6pm.  Jonny Ruiz and Glee Arvin is coming from the prospective facility.  CSW has also informed Buyer, retail, Charity fundraiser of visitors arrival.  Insurance underwriter, [name], LCSW-A Pronouns:  She, Her, Hers                  [name] ED Transitions of CareClinical Social Worker [email or web address] [phone]

## 2020-11-03 NOTE — ED Provider Notes (Signed)
Emergency Medicine Observation Re-evaluation Note  Paul Hendricks is a 30 y.o. male, seen on rounds today.  Pt initially presented to the ED for complaints of No chief complaint on file. Currently, the patient is sitting in bed comfortably.  Physical Exam  BP (!) 122/101 (BP Location: Left Arm)   Pulse (!) 124   Temp 98.6 F (37 C) (Axillary)   Resp 18   Ht 1.88 m (6\' 2" )   Wt 91 kg   SpO2 99%   BMI 25.76 kg/m  Physical Exam Left shoulder with slight erythema without cellulitis ED Course / MDM  EKG:   I have reviewed the labs performed to date as well as medications administered while in observation.  Recent changes in the last 24 hours include still awaiting placement  Plan  Current plan is for placement Patient is under full IVC at this time.   , MD 11/03/20 [phone]

## 2020-11-03 NOTE — ED Notes (Signed)
Pt alert this shift.  Nonverbal. Pt making noise and movements continuously. Restless. Pacing in room. Difficult to redirect. Pt ate breakfast and has been drinking fluids.

## 2020-11-03 NOTE — ED Notes (Signed)
Pt received lunch tray 

## 2020-11-03 NOTE — ED Provider Notes (Addendum)
Patient is now more relaxed.  He is now in his room and out of seclusion.  He is resting comfortably.   Zadie Rhine, MD 11/03/20 Kendal Hymen    Zadie Rhine, MD 11/03/20 0201

## 2020-11-03 NOTE — ED Notes (Signed)
Pt refused VS  

## 2020-11-03 NOTE — ED Notes (Signed)
Patient awakened physically aggressive toward staff and beating on unit door.  Staff unable to redirect patient.

## 2020-11-03 NOTE — ED Notes (Signed)
Patient is resting and not able to vitals on him. RN is aware.

## 2020-11-03 NOTE — ED Notes (Addendum)
Pt alert and laying in bed at this time. He is continuously making noise and moving around.

## 2020-11-03 NOTE — ED Notes (Signed)
Pt received breakfast tray 

## 2020-11-03 NOTE — Progress Notes (Signed)
TOC CSW received the following email update from [facility].   From: [name], [name] [email or web address]>  Sent: Saturday, November 01, 2020 5:58 PM To: Torres, Myrta E [email or web address]>; [email or web address]; [email or web address] Cc: Denton Ar [email or web address]>; Kristopher Oppenheim [email or web address]>; Dana Allan [email or web address]>; Tarpley-Carter, Lalaine Overstreet @[email or web address]>; Doylene Canning @[email or web address]>; Camillie Smith [email or web address]>; [email or web address] Subject: GM's Treatment team meeting on Monday, 11/03/20 at 3:30pm  This message was sent securely using Zix    *Caution - External email - see footer for warnings* Great evening Team 11/05/20;  I have talked to Mrs. [name], Youssef's guardian, to see when she was available for Allene Dillon treatment team telephone conference meeting to discuss and develop Jotham's isp update For Residential Level 4 services.  She indicated she would be available on Monday, 11/03/20 at 3:30pm.  Please email me to let me know if you will be able to participate.  The following is the telephone number and password to get into the meeting.  [phone], dial [phone]# and then [phone]# .  I look forward to meeting to make the next step toward the new chapter of Arhan's life.  Have a great weekend!  Thank you!  [name] [phone], QP I/DD Care [facility] [address] [place], [place]  [place] Office: [phone]  Fax: [phone]  Cell: [phone]  Email: [email or web address] Website: [email or web address]

## 2020-11-03 NOTE — ED Notes (Signed)
Patient wandered out of seclusion into room 42.  He is currently sleeping in bed.  Respirations even and unlabored.

## 2020-11-03 NOTE — Progress Notes (Signed)
TOC CSW received the following email update from [facility].  From: [name], [name] [email or web address]>  Sent: Friday, October 31, 2020 5:25 PM To: Casimiro Needle "[name]" [name] [email or web address]> Cc: Kristopher Oppenheim [email or web address]>; Dana Allan [email or web address]>; Conard Novak E [email or web address]>; Denton Ar @[email or web address]>; Tarpley-Carter, Medea Deines @[email or web address]>; Doylene Canning @[email or web address]> Subject: GM's Guardian decision on provider for GM  This message was sent securely using Zix    *Caution - External email - see footer for warnings* Great evening Mr. [name];  Since speaking with you regarding Johnathon's placement, I was contacted by another provider agency that agreed to accept providing Residential Level 4 services for [facility].  I discussed both provider agencies with Rashied's guardian this afternoon and she choose the other one agency which is local.  She has decided to go with the other agency that is in [facility] and [place]mer would still be in the [facility] catchment area instead of [facility] [employer] [facility].  Deuce's guardian wanted me to convey to you her thanks and appreciation for your agency agreeing to provide [facility] Level 4 services.    Mr. [name] if you don't mind, I would like to pass your name and agency to three of my fellow Care Coordinators who are also looking for Residential Level 4 services with clients in need of placement for agencies willing to accept clients with difficult behaviors (not as severe as Gaynor's behaviors).  Please email me back to let me know if it will be alright to pass on your information to my fellow Care Coordinators.  Thank you for your time and help with Valmore's situation.  Have a great evening and weekend.     Thanks!  [name] [name], QP I/DD Care [facility] [address] [place], [place]  [address] Office: [phone]  Fax: [phone]  Cell: [phone]  Email: [email or web address] Website: [email or web address]   Matei Magnone Tarpley-Carter, [name], LCSW-A Pronouns:  She, Her, Hers                  [name] ED Transitions of CareClinical Social Worker [email or web address] [phone]

## 2020-11-03 NOTE — ED Notes (Signed)
As I Observe the patient as he walks back and forward which he has been very quiet. For the moment he is laying down but has refuse to do his vitals from the last report from the Nurse [employer] .

## 2020-11-03 NOTE — ED Notes (Signed)
Pt given meal tray, however he started gagging just looking at the chicken, rice and green beans. Pt did tolerate eating the mixed fruit and drinking water. Pt not interested in side salad.

## 2020-11-04 MED ORDER — LORAZEPAM 2 MG/ML IJ SOLN
2.0000 mg | Freq: Two times a day (BID) | INTRAMUSCULAR | Status: DC | PRN
  Administered 2020-11-04 – 2021-05-11 (×184): 2 mg via INTRAMUSCULAR
  Filled 2020-11-04 (×203): qty 1

## 2020-11-04 NOTE — Progress Notes (Signed)
TOC CSW received an email from [facility] asking previous group home for reconsideration for acceptance of pt.  From: [name], [name] [email or web address]>  Sent: Monday, November 03, 2020 4:23 PM To: Casimiro Needle "[name]" [name] [email or web address]> Cc: Allene Dillon, Myrta E @[email or web address]>; Kristopher Oppenheim [email or web address]>; Denton Ar @[email or web address]>; Tarpley-Carter, Oswell Say @[email or web address]>; Camillie Smith @[email or web address]>; Orvan Falconer, Tena @[email or web address]>; Wanda [email or web address]> Subject: RE: GM's Guardian decision on provider for GM  This message was sent securely using Zix    *Caution - External email - see footer for warnings* Great evening Mr. [name];  Team [name] just meet with the other agency and it appears they are having second thoughts.  They are now saying they need more time think and discuss [place] and see Rishit face to face.   They are willing to get back together with Team [name] on Thursday at 3:30pm.  I would like to know if [employer] [employer] is willing to reconsider Yehuda if it this doesn't workout.  I hate to have to do this but I don't know what else to do.  Mrs. Allene Dillon, Anthonee's guardian was torn up and just broke down.  She is so afraid for [facility].  Please get back with me with an answer.  Have a great rest of your afternoon.   Thanks! [name] [name], QP I/DD Care [facility] [address] [place], [place]  [address] Office: [phone]  Fax: [phone]  Cell: [phone]  Email: [email or web address] Website: [email or web address] 24/7/365 Call Center: 630-197-4805 Toll-free Behavioral Health Crisis Line: (810)639-1827 For free, confidential and anonymous web-based screenings, visit: [email or web address]  Zaydin Billey Tarpley-Carter, [name], LCSW-A Pronouns:   She, Her, Hers                  [name] ED Transitions of CareClinical Social Worker [email or web address] [phone]

## 2020-11-04 NOTE — ED Notes (Addendum)
Pt given apple sauce and water as he has only eaten a couple pieces of fruit during my shift. Pt drank a couple sips of the water and then threw the cup of water on the floor. Pt did eat all of the apple sauce.

## 2020-11-04 NOTE — Progress Notes (Signed)
TOC CSW received this updated email from [facility] this morning.  Sent: Tuesday, November 04, 2020 9:03 AM To: Wanda Plump [email or web address]> Cc: Torres, Myrta E [email or web address]>; Denton Ar @[email or web address]>; Kristopher Oppenheim [email or web address]>; Dana Allan [email or web address]>; [email or web address]; [email or web address]; Hendricks, Angell Honse @[email or web address]>; Camillie Smith @[email or web address]> Subject: RE: GM's Guardian decision on provider for GM  This message was sent securely using Zix    *Caution - External email - see footer for warnings* Great morning Paul Hendricks;  Sorry that I am just getting back to you.  Things have been so hectic with trying finding Paul Hendricks a residential placement (not to mention talking with the [place] [employer] development officer, ER Social Worker, Adult [employer] and everyone connected to [employer] case), working on my annual isps and my notes, I have not had a chance to work on it.  I plan to add [employer] services to [employer] update for Residential Level 4 services. Hopefully that will be at the end of this week when I am able to lock down a provider agency for Residential Services.  I apologize that it is taking me so time with this but Paul Hendricks Residential Placement has been my top priority.  I will get to this as so as I can.  Have a great day!  Thanks!  Paul Hendricks, QP I/DD Care [facility] [address] [place], [place]  [address] Office: [phone]  Fax: [phone]  Cell: [phone]  Email: [email or web address] Website: [email or web address] Distance 24/7/365 Call Center: (828)426-8796 Toll-free Behavioral Health Crisis Line: 9128409640 For free, confidential and anonymous web-based screenings, visit: [email or web address]   Paul Hendricks, [name],  LCSW-A Pronouns:  She, Her, Hers                  Paul Hendricks Long ED Transitions of CareClinical Social Worker [email or web address] [phone]

## 2020-11-04 NOTE — ED Notes (Signed)
(  3) Tax adviser, RN, Comptroller assist for RN medication administration  Patient calm and resting on bed now

## 2020-11-04 NOTE — ED Provider Notes (Signed)
Emergency Medicine Observation Re-evaluation Note  Paul Hendricks is a 30 y.o. male, seen on rounds today.  Pt initially presented to the ED for complaints of No chief complaint on file. Currently, the patient is comfortably.  Physical Exam  BP (!) 154/111 (BP Location: Left Arm)   Pulse (!) 111   Temp 98.6 F (37 C) (Axillary)   Resp 20   Ht 6\' 2"  (1.88 m)   Wt 91 kg   SpO2 99%   BMI 25.76 kg/m  Physical Exam General: Nontoxic Cardiac: Mild tachycardia Lungs: Normal respiratory rate Psych: Calm at this time  ED Course / MDM  EKG:   I have reviewed the labs performed to date as well as medications administered while in observation.  Recent changes in the last 24 hours include none.  Plan  Current plan is for placement in a group home. Patient is under full IVC at this time.   , MD 11/04/20 [phone]

## 2020-11-04 NOTE — ED Notes (Signed)
Patient banging his fists on the glass window and door. (2) Security officers present

## 2020-11-04 NOTE — ED Notes (Signed)
Patient awake and hitting his hands on the glass. Security officers (2) present

## 2020-11-04 NOTE — Progress Notes (Signed)
TOC CSW received this email via [facility].   From: Wanda Plump [email or web address]>  Sent: Monday, November 03, 2020 10:47 PM To: Elveria Royals, [place] [email or web address]> Cc: Casimiro Needle "[name]" [name] [email or web address]>; Allene Dillon, Myrta E [email or web address]>; Kristopher Oppenheim [email or web address]>; Denton Ar [email or web address]>; [email or web address]; Camillie Smith [email or web address]>; Dana Allan [email or web address]> Subject: Re: GM's Guardian decision on provider for [facility]  Hey Team!    [name] I wanted to check to see when my authorization to participate in helping the guardian. I left you another message. I'm going to try to get out to get paperwork done with guardian this week   Riven Mabile Tarpley-Carter, [name], LCSW-A Pronouns:  She, Her, Hers                  [name] ED Transitions of CareClinical Social Worker [email or web address] [phone]

## 2020-11-04 NOTE — ED Notes (Signed)
Patient using non-verbal language attempting to leave his room (3) security officers present

## 2020-11-05 ENCOUNTER — Ambulatory Visit: Payer: Medicaid Other | Admitting: Neurology

## 2020-11-05 MED ORDER — DIVALPROEX SODIUM 125 MG PO CSDR
1000.0000 mg | DELAYED_RELEASE_CAPSULE | Freq: Two times a day (BID) | ORAL | Status: DC
  Administered 2020-11-05 – 2020-11-06 (×4): 1000 mg via ORAL
  Administered 2020-11-07: 875 mg via ORAL
  Administered 2020-11-08 – 2021-05-11 (×336): 1000 mg via ORAL
  Filled 2020-11-05 (×383): qty 8

## 2020-11-05 NOTE — ED Notes (Signed)
Pt refusing VS at this time.

## 2020-11-05 NOTE — ED Notes (Signed)
Pt refuse his vitals

## 2020-11-05 NOTE — ED Notes (Signed)
Pt refuses VS at this time.

## 2020-11-05 NOTE — ED Notes (Addendum)
Pt able to tolerate Depakote sprinkled into breakfast. Tolerate IM injections with arm held.
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
## 2020-11-05 NOTE — ED Notes (Signed)
Pt seen to be punching his buttocks. Red area noted to buttock. Pt asked to stop hitting himself.

## 2020-11-05 NOTE — ED Notes (Signed)
Pt refuse his vitals 

## 2020-11-05 NOTE — ED Notes (Signed)
Pt is getting manic RN was informed waiting on meds for the pt.

## 2020-11-06 NOTE — ED Notes (Signed)
Patient just jumped up out of bed and starting banging the glass window with his hands. Security spoke with patient and patient returned back to bed

## 2020-11-06 NOTE — ED Notes (Signed)
Patient hitting glass with hand/fist security at bedside

## 2020-11-06 NOTE — ED Notes (Signed)
Patient coming out of room and hitting door. Redirected by security. patient back in room yelling.

## 2020-11-06 NOTE — Progress Notes (Signed)
TOC CSW spoke with [facility] for an update.  [name] was in the process of contacting Dr. Beverly Milch and Dr. Larrie Kass [facility] in regards to pts future placement.  [name] will keep CSW updated.  Paul Hendricks, [name], LCSW-A Pronouns:  She, Her, Hers                  [name] ED Transitions of CareClinical Social Worker [email or web address] [phone]

## 2020-11-06 NOTE — ED Notes (Signed)
Patient is resting at this time.

## 2020-11-06 NOTE — ED Notes (Signed)
Patient awakened yelling, punching window, and beating head on glass.

## 2020-11-06 NOTE — ED Notes (Signed)
Patient in room beating on walls with closed fist.

## 2020-11-06 NOTE — ED Notes (Signed)
Patient awakened yelling.

## 2020-11-06 NOTE — ED Notes (Signed)
Patient banging head on bed in room. Patient asked to stop and repeatedly doing it. Medication administered.

## 2020-11-07 MED ORDER — LORAZEPAM 2 MG/ML IJ SOLN
1.0000 mg | Freq: Once | INTRAMUSCULAR | Status: AC
  Administered 2020-11-10: 1 mg via INTRAMUSCULAR

## 2020-11-07 NOTE — ED Notes (Addendum)
Attempted to give pt morning medications that were mixed in grape juice, again. This was the fifth attempt. He opened the cup and threw contents on the floor.

## 2020-11-07 NOTE — ED Provider Notes (Signed)
Emergency Medicine Observation Re-evaluation Note  Elena Cothern is a 30 y.o. male, seen on rounds today.  Pt initially presented to the ED for complaints of No chief complaint on file. Currently, the patient is standing in his room.  Physical Exam  BP (!) 154/111 (BP Location: Left Arm)   Pulse (!) 111   Temp 98.6 F (37 C) (Axillary)   Resp 20   Ht 6\' 2"  (1.88 m)   Wt 91 kg   SpO2 99%   BMI 25.76 kg/m  Physical Exam General: no distress Lungs: no distress Psych: calm  ED Course / MDM  EKG:   I have reviewed the labs performed to date as well as medications administered while in observation.  Recent changes in the last 24 hours include none.  Plan  Current plan is for ongoing attempts to secure permanent placement. Patient is not under full IVC at this time.   , MD 11/07/20 [phone]

## 2020-11-07 NOTE — ED Notes (Signed)
Attempted to get pts vitals. Vitals were almost complete and pt ripped off bp cuff from being too tight. Pt became aggravated afterwards and started biting his hand and hitting the top of his head with his other hand. Will try and attempt vitals at a late time.
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
## 2020-11-07 NOTE — Progress Notes (Signed)
TOC CSW received an update from [facility] see attached.   From: Val Eagle [email or web address]>  Sent: Friday, November 07, 2020 9:47 AM To: Conard Novak E [email or web address]>; [email or web address]; '[email or web address]' [email or web address]> Cc: Denton Ar [email or web address]>; Tarpley-Carter, Halina Asano @[email or web address]>; Camillie Smith [email or web address]>; Kristopher Oppenheim @[email or web address]>; Dana Allan [email or web address]> Subject: Fw: GM  This message was sent securely using Zix    *Caution - External email - see footer for warnings*   From: [name]" Owens [email or web address]> Sent: Friday, November 07, 2020 7:40 AM To: [name], [name] [email or web address]> Subject: Re: GM    Good morning to you, Ms. [name] and I hope all is blessed and well.  I should have an update for you later this afternoon regarding the status of our new potential AFLs that are located in the [facility] area.   Was there anything else you needed from me regarding our young man?  On Tue, Nov 04, 2020 at 3:43 PM [name], [name] [email or web address]> wrote: [place] afternoon Mr. [name];   Mrs. Allene Dillon would like to keep Mikail in the [facility] area therefore she will be able to visit him on a regular basis and if by chance an emergency would come up, she would be close by.  Have a great blessed evening!   Thanks!   [name] [name], QP I/DD Care [facility] [address] [place], [place]  [address] Office: [phone]  Fax: [phone]  Cell: [phone]  Email: [email or web address] Website: [email or web address] Distance 24/7/365 Call Center: 669-188-8832 Toll-free Behavioral Health Crisis Line: [phone] For free, confidential and anonymous web-based screenings, visit: [email or web address]    From: [email or web address]>  Sent: Tuesday, November 04, 2020 3:39 PM To: [name], [name] [email or web address]> Subject: GM   This message was sent securely using Zix     Hey Ms. [name].    Side question...is the guardian wanting him to stay in the [place] area?  Is that correct?  If so, we have 2 AFLs that are being onboarded and should be ready to go sometime next week.     Just wanted to let you know about that.   Thanks. Harriet Masson. Wayna Chalet, [facility]  Regional Director  [facility]  [phone] ext. [phone]  [email or web address]  [email or web address]  "We will faithfully honor one another's gifts by serving each other in a spirit of humility and unity."    Insurance underwriter, [name], LCSW-A Pronouns:  She, Her, Hers                  [name] ED Transitions of CareClinical Social Worker [email or web address] [phone]

## 2020-11-07 NOTE — ED Notes (Signed)
Pt enjoys apple juice

## 2020-11-09 NOTE — ED Notes (Signed)
Pt alert to self was able to take his meds at this time. I will continue to monitor.

## 2020-11-09 NOTE — ED Notes (Signed)
Patient refused vital signs at this time 

## 2020-11-09 NOTE — Progress Notes (Signed)
TOC CSW received this update from [employer].  From: [name], [name] [email or web address]>  Sent: Friday, November 07, 2020 3:44 PM To: Tarpley-Carter, Lyncoln Maskell [email or web address]> Cc: Doylene Canning @[email or web address]> Subject: Copy of 5 day ER Report with Provider List  This message was sent securely using Zix    *Caution - External email - see footer for warnings* Great afternoon Nakoma Gotwalt;  Attached is a copy of Tra[facility]on' 5 day ER report.  I am still awaiting a call from Mr. [name] with [facility] to let me know if he has found a placement for [facility] in [facility].   After their visit to the ER, seeing how Rook's room was, and speaking with the ER nurse and CNA staff, [facility] declined placing [facility] indicating Perrin was not a good fit with the other clients at the group home.  As soon as I hear something from back from [facility] [employer], I will email you.  If you have any questions or concerns please feel free to contact me at the numbers below.  Have a great weekend!  Thanks!  [name] Wynona Canes [facility] IDD Care Coordinator [phone]  office [phone]   Pasha Broad Tarpley-Carter, [name], LCSW-A Pronouns:  She, Her, Aleda [facility] ED Transitions of CareClinical Social Worker [email or web address] [phone]

## 2020-11-09 NOTE — ED Notes (Signed)
Patient has episode of loud moaning and jumping out of bed and hits glass window

## 2020-11-10 NOTE — ED Notes (Signed)
Patient is very difficult to redirect and is unwilling to follow commands. Patient currently refusing vitals.

## 2020-11-10 NOTE — ED Notes (Signed)
Pt is difficult to redirect will get vitals next time there up per RN. 

## 2020-11-10 NOTE — ED Notes (Addendum)
Gave pt lunch. He ate the french fries and threw away the pita bread, cucumbers, carrots, and chicken salad. Pt is making loud noises, pacing around his room, banging his feet on the floor, and flapping his hands. I am concerned that the pt is constipated. Will provide this information in shift report.   I offered him a sandwich, and he indicated that he didn't want it. I handed him orange juice with miralax mixed in. He smelled it and promptly poured it on the floor. I turned off the lights in his room and closed his door to provide a lower stimulation environment.

## 2020-11-10 NOTE — ED Notes (Signed)
Lunch tray given to pt.

## 2020-11-10 NOTE — ED Notes (Signed)
Breakfast given to pt

## 2020-11-10 NOTE — ED Notes (Addendum)
From [id number], calmly sat in his room or rested in his bed. At 17:06 pt banged the glass in his door and went back to bed.

## 2020-11-10 NOTE — ED Notes (Signed)
Pt is biting his hand and flapping his hands. He is banging his head against the glass in his door. We opened the door to prevent accidental self harm.

## 2020-11-10 NOTE — ED Notes (Signed)
Pt was calm and stayed in his room all day. He took all of his Depakote sprinkles in a chocolate brownie.
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
## 2020-11-10 NOTE — Progress Notes (Signed)
TOC CSW received attached email from [facility] as a update of pts placement.  From: Paul Hendricks [email or web address]>  Sent: Monday, November 10, 2020 11:42 AM To: Paul Hendricks E [email or web address]>; [email or web address]; 'Paul Hendricks' [email or web address]> Cc: Paul Hendricks @[email or web address] .com>; Paul Hendricks [email or web address]>; Paul Hendricks @[email or web address]>; Paul Hendricks [email or web address]>; [email or web address]; Paul Hendricks [email or web address]>; Paul Hendricks @[email or web address]> Subject: FW: Status on GM's residential placement  This message was sent securely using Zix    *Caution - External email - see footer for warnings* Great morning Team Paul Hendricks;  I am forwarding you the [facility] I received from Mr. Paul Hendricks from [employer] [facility] regarding [name]'s [name] placement.  [facility] agreed to accept Paul Hendricks they are just waiting for the AFLs, that have been identified, to receive their Health and Safety inspections and to be placed under [facility] contract.  Mr. Paul Hendricks has stated in the email that he would let Team Paul Hendricks know as soon as this is completed.  I would like to schedule Paul Hendricks's treatment team meeting as soon as possible after Paul Hendricks's [name] placement has been secured to develop Paul Hendricks's isp update for Residential Level 4 placement and Paul Hendricks.    I attempted to contact Ms. Paul Hendricks this morning to see if her team had a chance to review Paul Hendricks's case and if [facility] has an open placement and if not will [facility] place [name] on their waiting list.  It is my intention to contact Ms. Paul Hendricks once a week to check on the status of Paul Hendricks's application when/if Paul Hendricks is placed on [place].  If you have any questions or concerns, please feel free to  contact me by phone or email.  Have a great day.  Thanks!  Hendricks D. [name], QP IDD Care Coordinator, [facility] [phone] (office) [phone] (cell) [phone] (fax)  From: Paul Hendricks" Paul Hendricks [email or web address]>  Sent: Friday, November 07, 2020 4:57 PM To: Paul Hendricks @[email or web address]> Subject: Re: Status on GM's residential placement  Hey Ms. [name].  This is to let you know that we have 3 [name] recruits in the [place] area.  Some AFLs aren't ready just yet... they're just getting a few more things completed before moving forward.  I'll keep you in the loop.    Thanks. Paul Hendricks   On Fri, Nov 07, 2020 at 9:46 AM Paul Hendricks @[email or web address]> wrote: Paul Hendricks morning Team Paul Hendricks;   I am emailing to you to let you know I received an email from Mr. Paul Hendricks, with [facility], this morning, regarding the status of an open placement for [facility].  He stated that he should have an answer for me later on today regarding an open placement for [facility] in the [place] area later on this afternoon.  When I hear back from Mr. Paul Hendricks, I will email Team Hadley to let you know what he says.  Have a great day!  Thanks!  Hendricks D. [name], BSW, QP IDD Care Coordinator, [facility], [name], LCSW-A Pronouns:  She, Her, Hers                  Paul Hendricks Long ED Transitions of CareClinical Social Worker [email or web address] [phone]

## 2020-11-11 NOTE — Progress Notes (Signed)
TOC CSW received an update from [facility] in regards to pts placement search.    From: [name], Paul [email or web address]>  Sent: Tuesday, November 11, 2020 2:30 PM To: Allene Dillon, [place] E [email or web address]>; '[email or web address]' [email or web address]>; [email or web address] Cc: Denton Ar @[email or web address]>; Hendricks, Lonia Roane @[email or web address]>; Camillie Smith @[email or web address]>; Kristopher Oppenheim @[email or web address]>; Dana Allan @[email or web address]>; Paul Hendricks @[email or web address]> Subject: Status on GM's [place] Application  This message was sent securely using Zix    *Caution - External email - see footer for warnings* Great afternoon Team Paul Hendricks;  Just wanted to update you all on the conversation I had with Paul Hendricks with [facility].  Ms. Paul Hendricks indicated Paul Hendricks's case will go before the [employer] on Thursday, 11/13/21 at 2pm - 2:30pm.  I have made myself available to attend the meeting to be able to answer any questions or concerns the [facility] board my have.  Ms. Paul Hendricks stated to me that this first meeting will be focused on the community resources, responses I received and who agreed to assist with providing [place] services.  The next meeting will focus on Lisandro's clinical aspect.  I will continue to keep Team Dimitri up to date on my efforts regarding Lazarus.  Have a great rest of your afternoon.  Stay safe and keep cool.  Thanks!  Paul Hendricks, QP IDD Care Coordinator, [facility] [phone] (office) [phone] (cell) [phone] (fax)  Paul Hendricks, [name], LCSW-A Pronouns:  She, Her, Hers                  Paul Hendricks Long ED Transitions of CareClinical Social Worker [email or web address] [phone]

## 2020-11-11 NOTE — ED Notes (Signed)
Pt awakened around 0420 and began verbalizing in the room. NT offered juice. Pt continues to verbalize. NT offered warm blanket and pt laid down on the bed.

## 2020-11-11 NOTE — ED Notes (Signed)
Pt alert this shift. Nonverbal. Pt eating and drinking this shift.  Medication compliant this AM.  Pt became upset, biting self, hitting door, difficult to redirect.  PRN Klonopin 1 mg PO given at 1617 PRN Ativan 2 mg IM given at 1721.  Medication effective, pt resting comfortably, no s/s of distress.  Sitter at bedside

## 2020-11-11 NOTE — ED Provider Notes (Signed)
Emergency Medicine Observation Re-evaluation Note  Young Mulvey is a 30 y.o. male, seen on rounds today.  Pt initially presented to the ED for complaints of No chief complaint on file. Currently, the patient is sleeping.  Physical Exam  BP (!) 126/95 (BP Location: Left Arm)   Pulse (!) 104   Temp 98.6 F (37 C) (Axillary)   Resp 18   Ht 6\' 2"  (1.88 m)   Wt 91 kg   SpO2 99%   BMI 25.76 kg/m  Physical Exam General: no acute distress, sleeping wrapped up in the covers Cardiac: mild tachycardia. Lungs: clear, no tachypnea Psych: cooperative at this time but can become agitated  ED Course / MDM  EKG:   I have reviewed the labs performed to date as well as medications administered while in observation.  Recent changes in the last 24 hours include intermittent need for re-direction.  Plan  Current plan is for placement.  [facility] accepted for waiting for AFLs to go through.  No new word today.    , MD 11/11/20 [phone]

## 2020-11-11 NOTE — ED Notes (Signed)
Pt refused Vitals at this time will try again later.

## 2020-11-12 NOTE — ED Notes (Signed)
Pt given two sandwiches, 2 juices, and a banana. Pt ate and drank all items.

## 2020-11-12 NOTE — ED Provider Notes (Signed)
Emergency Medicine Observation Re-evaluation Note  Paul Hendricks is a 30 y.o. male, seen on rounds today at 26.  Pt initially presented to the ED for complaints of No chief complaint on file. Currently, the patient is asleep.  Physical Exam  BP 111/79   Pulse (!) 108   Temp 97.8 F (36.6 C)   Resp 16   Ht 6\' 2"  (1.88 m)   Wt 91 kg   SpO2 100%   BMI 25.76 kg/m  Physical Exam General: asleep, comfortable    ED Course / MDM  EKG:   I have reviewed the labs performed to date as well as medications administered while in observation.  Recent changes in the last 24 hours include no acute events overnight.  Plan  Current plan is for placement.    , MD 11/12/20 1023

## 2020-11-12 NOTE — ED Notes (Signed)
Pt is agitated and banging on walls. Pt is not able to be calmed or re-directed.

## 2020-11-12 NOTE — ED Notes (Signed)
Pr Ann with pharmacy, thorazine is compatible with benadryl for IM injection.

## 2020-11-13 NOTE — ED Provider Notes (Signed)
Emergency Medicine Observation Re-evaluation Note  Paul Hendricks is a 30 y.o. male, seen on rounds today.  Pt initially presented to the ED for complaints of No chief complaint on file. Currently, the patient is asleep.  Physical Exam  BP 111/79   Pulse (!) 125   Temp 97.8 F (36.6 C)   Resp 16   Ht 6\' 2"  (1.88 m)   Wt 91 kg   SpO2 98%   BMI 25.76 kg/m  Physical Exam General: no distress Lungs: no respiratory distress Psych: calm  ED Course / MDM  EKG:   I have reviewed the labs performed to date as well as medications administered while in observation.  Recent changes in the last 24 hours include none.  Plan  Current plan is for placement. Patient is not under full IVC at this time.   , MD 11/13/20 [phone]

## 2020-11-13 NOTE — ED Notes (Signed)
Pt given 2 sandwiches and juices. Pt ate and drank all items.
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
## 2020-11-13 NOTE — ED Notes (Signed)
Pt in bed resting, respirations even and unlabored. 
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
## 2020-11-14 NOTE — ED Notes (Signed)
Patient asleep rise and fall of chest breathing observed.

## 2020-11-14 NOTE — ED Notes (Signed)
Patients behavior is escalating and non-verbal language louder. Patient hitting the bed

## 2020-11-14 NOTE — ED Notes (Signed)
Patient received breakfast tray Sitter playing music for patient Patient is vocalizing non-verbal language
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
## 2020-11-14 NOTE — ED Notes (Signed)
Pt given 2 sandwiches and juices. Pt ate and drank all that was given.

## 2020-11-14 NOTE — Progress Notes (Signed)
CSW spoke with [facility], about an update for placement. Ms Elveria Royals stated the placement that agreed to accept pt, is working on an health and safety exception and waiting for [name] to be added on to the agency contract.    [name].Talor Desrosiers, [name], LCSWA [name]  Transitions of Care Clinical Social Worker I Direct Dial: [phone]  Fax: [phone] [name]

## 2020-11-14 NOTE — ED Notes (Signed)
Pt alert this shift. Pt mostly calm. Calling out. Pt eating and drinking.

## 2020-11-14 NOTE — ED Notes (Signed)
Patient is getting calmer. Resting on bed with an occasional non-verbal moan

## 2020-11-15 NOTE — ED Notes (Signed)
Pt alert this shift. Nonverbal. Pt eating and drinking . No s/s of distress. No aggression noted.

## 2020-11-15 NOTE — ED Notes (Addendum)
Pt. Restless, agitated and yelling out loud. Pt. Given sandwich and pushed away. RN, Huntley Dec offering popsicle for comfort to calm patient. Pt. Is alert, sitter at bedside.

## 2020-11-15 NOTE — ED Provider Notes (Addendum)
Emergency Medicine Observation Re-evaluation Note  Paul Hendricks is a 30 y.o. male, seen on rounds today.  Pt initially presented to the ED for complaints of No chief complaint on file. Currently, the patient is patient resting comfortably in bed..  Physical Exam  BP (!) 134/106   Pulse (!) 143   Temp 97.8 F (36.6 C)   Resp 18   Ht 1.88 m (6\' 2" )   Wt 91 kg   SpO2 99%   BMI 25.76 kg/m  Physical Exam General: No acute distress  Psych: More calm and cooperative today  ED Course / MDM  EKG:   I have reviewed the labs performed to date as well as medications administered while in observation.  Recent changes in the last 24 hours include no new changes .  Plan  Current plan is for placement next week Patient is under full IVC at this time.   , MD 11/15/20 11/17/20    6808, MD 11/15/20 [phone]

## 2020-11-15 NOTE — ED Notes (Addendum)
Pt. Refused vital signs with sitter at bedside. Pt. Oxygen level 93% on room air.

## 2020-11-16 LAB — COMPREHENSIVE METABOLIC PANEL
ALT: 26 U/L (ref 0–44)
AST: 29 U/L (ref 15–41)
Albumin: 3.5 g/dL (ref 3.5–5.0)
Alkaline Phosphatase: 75 U/L (ref 38–126)
Anion gap: 10 (ref 5–15)
BUN: 11 mg/dL (ref 6–20)
CO2: 22 mmol/L (ref 22–32)
Calcium: 9.3 mg/dL (ref 8.9–10.3)
Chloride: 108 mmol/L (ref 98–111)
Creatinine, Ser: 0.92 mg/dL (ref 0.61–1.24)
GFR, Estimated: >60 mL/min (ref >60)
Glucose, Bld: 115 mg/dL — ABNORMAL HIGH (ref 70–99)
Potassium: 4.3 mmol/L (ref 3.5–5.1)
Sodium: 140 mmol/L (ref 135–145)
Total Bilirubin: 0.9 mg/dL (ref 0.3–1.2)
Total Protein: 7.3 g/dL (ref 6.5–8.1)

## 2020-11-16 LAB — VALPROIC ACID LEVEL: Valproic Acid Lvl: 35 ug/mL — ABNORMAL LOW (ref 50.0–100.0)

## 2020-11-16 NOTE — ED Provider Notes (Addendum)
Emergency Medicine Observation Re-evaluation Note  Paul Hendricks is a 30 y.o. male, seen on rounds today.  Pt initially presented to the ED for complaints of No chief complaint on file. Currently, the patient is sleeping and resting comfortably.  Physical Exam  BP (!) 163/136 (BP Location: Left Arm)   Pulse (!) 138   Temp 97.8 F (36.6 C)   Resp (!) 24   Ht 1.88 m (6\' 2" )   Wt 91 kg   SpO2 100%   BMI 25.76 kg/m  Physical Exam General: No acute distress Psych: Calm cooperative   ED Course / MDM  EKG:   I have reviewed the labs performed to date as well as medications administered while in observation.  Recent changes in the last 24 hours include patient has become more tachycardic..  Informed by nursing that patient requires restraint while getting vital signs which is likely etiology of his tachycardia.  Electrolytes are within normal limits.  Plan  Current plan is for we will check laboratory studies and EKG and reassess.. Patient is under full IVC at this time.   , MD 11/16/20 11/18/20    1791, MD 11/16/20 1020

## 2020-11-16 NOTE — ED Notes (Signed)
Pt received dinner tray.
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
## 2020-11-17 MED ORDER — DIPHENHYDRAMINE HCL 25 MG PO CAPS
25.0000 mg | ORAL_CAPSULE | Freq: Two times a day (BID) | ORAL | Status: DC
  Administered 2020-11-17 – 2021-05-11 (×262): 25 mg via ORAL
  Filled 2020-11-17 (×293): qty 1

## 2020-11-17 MED ORDER — LORAZEPAM 2 MG/ML IJ SOLN
2.0000 mg | Freq: Once | INTRAMUSCULAR | Status: AC | PRN
  Administered 2020-11-17: 2 mg via INTRAMUSCULAR
  Filled 2020-11-17: qty 1

## 2020-11-17 MED ORDER — DIPHENHYDRAMINE HCL 50 MG/ML IJ SOLN
25.0000 mg | Freq: Two times a day (BID) | INTRAMUSCULAR | Status: DC
  Administered 2020-12-18 – 2021-05-10 (×53): 25 mg via INTRAMUSCULAR
  Filled 2020-11-17 (×70): qty 1

## 2020-11-17 MED ORDER — CHLORPROMAZINE HCL 25 MG PO TABS
50.0000 mg | ORAL_TABLET | Freq: Three times a day (TID) | ORAL | Status: DC
  Administered 2020-11-17 – 2021-05-10 (×421): 50 mg via ORAL
  Filled 2020-11-17 (×465): qty 2

## 2020-11-17 NOTE — ED Provider Notes (Signed)
Emergency Medicine Observation Re-evaluation Note  Paul Hendricks is a 30 y.o. male, seen on rounds today.  Pt initially presented to the ED for complaints of No chief complaint on file. Currently, the patient is standing in his room.  Physical Exam  BP (!) 142/90 (BP Location: Right Arm)   Pulse (!) 108   Temp 97.8 F (36.6 C)   Resp 18   Ht 6\' 2"  (1.88 m)   Wt 91 kg   SpO2 98%   BMI 25.76 kg/m  Physical Exam General: no distress Lungs: no distress Psych: cooperative  ED Course / MDM  EKG:   I have reviewed the labs performed to date as well as medications administered while in observation.  Recent changes in the last 24 hours include none.  Patient received multiple IM injections per day.  Nursing staff has wondered whether or not a long-acting antipsychotic would be effective.  Plan  Current plan is for placement. Patient is under full IVC at this time. Psychiatric consult placed for consideration of long-acting antipsychotics.   , MD 11/17/20 [phone]

## 2020-11-17 NOTE — ED Notes (Signed)
Patient banging on the the glass door with his fists and head, IM

## 2020-11-17 NOTE — Progress Notes (Signed)
CSW received this this e-mail from [facility] regarding placement.   11/14/20 This message was sent securely using Zix    *Caution - External email - see footer for warnings* Great afternoon [place];  I have just copied you on an email, Osha's 5 day ER Report.  As soon as I hear anything regarding Matisse's Residential Level 4 placement I will let you know.  Have a great afternoon and weekend.  Thanks!  [name] [name], QP IDD Care Coordinator, [facility] [phone] (office) [phone] (cell) [phone] (fax)  This message was sent securely using Zix    *Caution - External email - see footer for warnings* Great afternoon [place];  Attached is a copy of Amaury Folson's 5 Day ER Report.  As soon as I hear from Mr. Orvan July regarding [name] inspection approval and Darek's move in date, I will inform you and Team [name] Have a great afternoon and weekend.    Thanks!  [name] [name], QP IDD Care Coordinator, [facility] [phone] (office) [phone] (cell) [phone] (fax)   CONFIDENTIALITY NOTICE: This message and any attachments included are from [facility] and are for sole use by the intended recipient(s).   [name].Conchita Truxillo, [name], LCSWA [name]  Transitions of Care Clinical Social Worker I Direct Dial: [phone]  Fax: [phone] [name]

## 2020-11-17 NOTE — Consult Note (Signed)
Receive no psych consult from medication recommendations.  Psych consult is as follows" evaluate whether Depo injection antipsychotic would be appropriate.  Patient is receiving 2 injections/day."  Chart review indicates patient is receiving chlorpromazine 50 mg IM twice daily for severe agitation, aggressive behaviors.  At this time we will convert chlorpromazine intramuscular injection to chlorpromazine tablet.  Patient also has Haldol 5 mg IM every 6 hours as needed for agitation.  Chart review indicates patient has received 1 dose of Haldol 5 mg IM in the past 4 days.  He is showing some improvement in his disruptive behaviors, aggression, as noted by decrease in IM injections being administered.  New orders to follow.  -Discontinue chlorpromazine IM 50 mg twice daily -Initiate chlorpromazine 50 mg p.o. 3 times daily for severe agitation, aggression, and behavioral disturbances.  Chlorpromazine does not come in long acting Depo. -Continue Ativan 2 mg IM twice daily as needed for sedation, agitation, EPS like symptoms.  -Continue working with social work to facilitate appropriate placement of patient with severe autism, as the emergency room remains an unsafe place for presents with mild to severe cognitive impairment.

## 2020-11-18 NOTE — ED Notes (Signed)
Patient sleeping/rise and fall of chest breathing observed 

## 2020-11-18 NOTE — ED Notes (Signed)
Patient received breakfast tray/ holding breakfast tray untill patient is awake

## 2020-11-19 NOTE — Progress Notes (Signed)
CSW called, LM, with:  [name] [name], QP IDD Care Coordinator, [facility] [phone] (office)   Requested daily updates on placement progress.  Daleen Squibb, [name], LCSW 6/29/20225:24 PM

## 2020-11-20 NOTE — Progress Notes (Signed)
TOC CM received call from [facility], states they have locate a [name] for patient and he has been accepted. Waiting for facility to complete their Health and Safety and paperwork to service pt. Isidoro Donning RN CCM, [facility] ED TOC CM [phone]   Please see following emails.    Caution - External email - see footer for warnings*  Great afternoon [facility] Social Workers; Can you email([email or web address]) or [phone]) me a copy of Vitali's case notes for Le's file.  Have a great day! Thanks!  [name] [name], QP IDD Care Coordinator, [facility] [phone] (office) Tuesday and Thursday (in the office) [phone] (cell)  Monday, Wednesday, and Friday (working from home) [phone] (fax)     From: [email or web address]>  Sent: Thursday, November 20, 2020 1:00 PM To: Armandina Gemma [email or web address]>; Lenetta Quaker @[email or web address]>; Jules Husbands @[email or web address]>; [name], [name] @[email or web address]>; Pearletha Alfred @[email or web address]>; Cyndee Brightly, LaShawn @[email or web address]>; Donnetta Simpers @[email or web address] .[email or web address]>; [email or web address]; [email or web address]; Rogers,Tia @[email or web address] .[email or web address]>; [email or web address]; Terrell, Dollie @[email or web address]>; Orson Aloe, Tiera @[email or web address] .[email or web address]>; [email or web address]; Stanley-Underwood, Larita Fife @[email or web address] .[email or web address]>; Kristopher Oppenheim @[email or web address] .[email or web address]>; Rennie Natter @[email or web address] .[email or web address]>; Allen Derry @[email or web address] .[email or web address]>; [email or web address]; Medley, Toccaro @[email or web address] .[email or web address]>; Arita Miss, Alla @[email or web address] .[email or web address]>; [email or web address]; [email or web address]; [email or web address]; [email or web address]; [email or web address]; [email or web address];  [email or web address]; Curatolo, Philip @[email or web address] .[email or web address]>; Doss-Jarrett, Denise @[email or web address] .[email or web address]>; Deretha Emory, Aisha @[email or web address] .[email or web address]>; Lisa Roca @[email or web address] .[email or web address]> Subject: Available [name] homes  Hello, I hope each one of you are having a great Thursday! Here is a current list of available homes that we currently have. We are working to add more homes within our agency. I have included a short parent profile telling you a small amount about our wonderful care providers. If you have any consumers you feel will be match for the available care providers, please feel free to email [place] at [email or web address] or call us at [phone].   As a reminder we do provide CLS services, predominantly parents who are providing services to their adult child. If you know of anyone needing a new agency please tell them to consider [place]. In the 23 years we have been providing service, our mission is to make sure consumers are placed in a home they can call their own. Longevity is our goal. We strive to make sure our placements are well matched. Again, if you have any questions please email or call us.   Sincerely,     This message was secured by Zix.    Great afternoon [facility] Social Workers; I am forwarding you an email I received back from Pasty Arch with [facility] [employer] on Monday, 11/17/20. Thanks! [name] [name], QP IDD Care Coordinator, [facility] [phone] (office) Tuesday and Thursday (in the office) [phone] (cell)  Monday, Wednesday, and Friday (working from home) [phone] (fax)  From: Casimiro Needle "[name]" [name] [email or web address]>  Sent: Monday, November 17, 2020 1:13 PM To: Torres, Myrta E @[email or web address]> Cc: [name], [name] @[email or web address]>; [email or web address]; Christovale, Christina @[place] .com>; [email or web address]  [email or web address]>; Doylene Canning @[place] .com>; Sharlee Blew @[email or web address] .gov>; Denton Ar @outlook .com>; Kristopher Oppenheim @[email or web address] .org>; Dana Allan @[email or web address] .org>; Virgia Land @[email or web address] .[email or web address]> Subject: Re: FW: Status on GM's residential placement  Hello Ms. [name] and Orthoptist.  I pray all is well with you, too.  Let me get with my team to see where we are and get back to you.  Thank you.  Nolon Bussing. Wayna Chalet, [name] Regional Director  [facility]   On Mon, Nov 17, 2020, 1:08 PM Allene Dillon, [place] E @[email or web address]> wrote: Thank you for following up Ms. [name],   Good day to all   From: [name], [name] @[email or web address]>  Sent: Monday, November 17, 2020 12:05 PM To: Casimiro Needle "[name]" [name] @[email or web address]> Cc: Torres, Myrta E @[email or web address]>; [email or web address]; Christovale, Christina @[place] .com>; [email or web address] @[email or web address] .[email or web address]>; Doylene Canning @[place] .com>; Sharlee Blew @[email or web address] .gov>; Denton Ar @outlook .com>; Kristopher Oppenheim @[email or web address] .org>; Dana Allan @[email or web address] .org>; Virgia Land @[email or web address] .[email or web address]> Subject: RE: FW: Status on GM's residential placement      Great afternoon Mr. [name];   I pray all is well with you.  I am just checking in with you to see what the status of Sotero's [name] placement.  Do you have a scheduled date for [facility] to perform the Health and Safety Inspections for your [name] placements for [place]?  Give me a call or email me to give me the status.  I am copying Team Zeyad to let them know that I am checking the status of Renji's [name] with [facility] [employer].  Have a great day!   Thanks     [name] [name], QP IDD Care Coordinator, [facility] [phone] (office)  Tuesday and Thursday (working in the office) [phone] (cell)  Monday, Wednesday and Friday (working from home) [phone] (fax)   From: [name]" [name] @[email or web address]>  Sent: Monday, November 10, 2020 4:03 PM To: [name], [name] @[email or web address]> Cc: Torres, Myrta E @[email or web address]>; [email or web address]; [email or web address]>; Tarpley-Carter, Ricquita @[place] .com>; Doylene Canning @[place] .com>; Camillie Smith @[email or web address] .gov>; Denton Ar @outlook .com>; Kristopher Oppenheim @[email or web address] .org>; Dana Allan @[email or web address] .org>; Virgia Land @[email or web address] .[email or web address]> Subject: Re: FW: Status on GM's residential placement   A pleasant Monday to everyone.  I hope you all are doing well.  I have copied Virgia Land on this email.  She is our Marine scientist.  She is also an LPN. Ms. Elveria Royals, moving forward, would you kindly add her to all emails regarding Kwadwo?  She'll be providing additional support to [employer].     Jacquelynn...would you like to add anything?   Thank you kindly. [name]    On Mon, Nov 10, 2020 at 11:41 AM [name], [name] @[email or web address]> wrote: Randie Heinz morning Team [name];   I am forwarding you the [facility] I received from Mr. [name] from [facility] regarding Byrd's [name] placement.  [facility] [employer] [facility] has agreed to accept Macaulay they are just waiting for the AFLs, that have been identified, to receive their Health and Safety inspections and to be placed under [employer]'s contract.  Mr. [name] has stated in the email that he would let Team Aulden know as soon as this is completed.  I would like to schedule Gil's  treatment team meeting as soon as possible after Biagio's [name] placement has been secured to develop Kiyoto's isp update for Residential Level 4 placement and [employer].     I attempted to contact Ms. [name] this morning to see if her team had a chance to review Laydon's case and if [facility] has an open placement and if not will [facility] place [place] on their waiting list.  It is my intention to contact Ms. [name] once a week to check on the status of Undrea's application when/if Grason is placed on [place].   If you have any questions or concerns, please feel free to contact me by phone or email.  Have a great day.   Thanks!   [name] [name], QP IDD Care Coordinator, [facility] [phone] (office) [phone] (cell) [phone] (fax)   From: [name]" [name] @[email or web address]>  Sent: Friday, November 07, 2020 4:57 PM To: [name], [name] @[email or web address]> Subject: Re: Status on GM's residential placement   Hey Ms. [name].  This is to let you know that we have 3 [name] recruits in the [place] area.  Some AFLs aren't ready just yet... they're just getting a few more things completed before moving forward.  I'll keep you in the loop.     Thanks. [name]    On Fri, Nov 07, 2020 at 9:46 AM [name], [name] @[email or web address]> wrote: Peri Jefferson morning Team [name];    I am emailing to you to let you know I received an email from Mr. [name], with [employer], this morning, regarding the status of an open placement for [facility].  He stated that he should have an answer for me later on today regarding an open placement for [facility] in the [place], [facility] area later on this afternoon.  When I hear back from Mr. [name], I will email Team Aizen to let you know what he says.  Have a great day!   Thanks!   [name], BSW, QP IDD Care Coordinator, [facility]      --  Nolon Bussing. Wayna Chalet, [facility] Regional Director [facility] [phone]. [phone] [email or web address] "We will faithfully honor one another's gifts by serving each other in a spirit of humility and unity."   CONFIDENTIALITY: The contents of this e-mail message and any attachments are confidential and are intended solely for addressee. The information may also be legally privileged. This transmission is sent in trust, for the sole purpose of delivery to the intended recipient. If you have received this transmission in error, any use, reproduction or dissemination of this transmission is strictly prohibited. If you are not the intended recipient, please immediately notify the sender by reply e-mail or phone and delete this message and its attachments, if any.     --  Nolon Bussing. Wayna Chalet, [facility] Regional Director [facility] [phone]. [phone] [email or web address] "We will faithfully honor one another's gifts by serving each other in a spirit of humility and unity."   CONFIDENTIALITY: The contents of this e-mail message and any attachments are confidential and are intended solely for addressee. The information may also be legally privileged. This transmission is sent in trust, for the sole purpose of delivery to the intended recipient. If you have received this transmission in error, any use, reproduction or dissemination of this transmission is strictly prohibited. If you are not the intended recipient, please immediately notify the sender by reply e-mail or phone and delete this message and its attachments, if any.      This message was secured by Zix.

## 2020-11-20 NOTE — ED Notes (Signed)
Pt resting comfortable. Pt has been eating and drinking this shift. Pt had medication. No aggression or agitation noted.

## 2020-11-20 NOTE — ED Notes (Signed)
Pt resting this shift. No s/s of distress. Pt received scheduled medication.

## 2020-11-21 DIAGNOSIS — Z20822 Contact with and (suspected) exposure to covid-19: Secondary | ICD-10-CM | POA: Diagnosis not present

## 2020-11-21 DIAGNOSIS — Z79899 Other long term (current) drug therapy: Secondary | ICD-10-CM | POA: Diagnosis not present

## 2020-11-21 DIAGNOSIS — F840 Autistic disorder: Secondary | ICD-10-CM | POA: Diagnosis not present

## 2020-11-21 DIAGNOSIS — G40909 Epilepsy, unspecified, not intractable, without status epilepticus: Secondary | ICD-10-CM | POA: Diagnosis not present

## 2020-11-21 DIAGNOSIS — R569 Unspecified convulsions: Secondary | ICD-10-CM | POA: Diagnosis present

## 2020-11-21 NOTE — ED Notes (Signed)
Patient beating on window with closed fist.

## 2020-11-21 NOTE — ED Notes (Signed)
Pt refusing vital signs. Blood pressure cuff was on the pts arm and he jerked it off. Pt has been calm and cooperative all day. Pt ate his breakfast and dinner. Pt washed his hands and face this morning as well.

## 2020-11-21 NOTE — ED Notes (Signed)
Patient sleeping rise and fall of chest breathing observed Breakfast tray arrived holding till patient is awake

## 2020-11-21 NOTE — Progress Notes (Signed)
TOC CSW received an update from [facility].  From: [name], [name] @[email or web address]>  Sent: Friday, November 21, 2020 11:35 AM To: Conard Novak E @[email or web address]>; [email or web address]; '[email or web address]' [email or web address]> Cc: Marylou Mccoy @[email or web address] .[email or web address]>; Kristopher Oppenheim @[email or web address] .[email or web address]>; Dana Allan @[email or web address] .org>; Ashok Cordia @[email or web address] .[email or web address]>; Doylene Canning @[place] .com>; Toben, Stacey @[place] .com>; Sharol Roussel @[place] .com>; Tarpley-Carter, Stephon Weathers @[place] .com>; Shavis, Alesia @[email or web address] .com>; Tarpley-Carter, Erianna Jolly @[place] .com>; [email or web address]; Jacquelynn Moody @[email or web address] .[email or web address]> Subject: FW: FW: Status on GM's residential placement  This message was sent securely using Zix    *Caution - External email - see footer for warnings* Great morning Team Paul Hendricks; I have some disappointing news.  I am forwarding the email I received from Pasty Arch from [place] [facility] which states that [facility] [employer] does not have an [name] placement for [facility].  I will start call providers on Tuesday.  I will keep you posted! Thanks! [name] [name], QP  IDD Care Coordinator, [facility] [phone] (office) Tuesday and Thursday (in the office) [phone] (cell)  Monday, Wednesday, and Friday (working from home) [phone] (fax)  From: Casimiro Needle "[name]" [name] @[email or web address]>  Sent: Thursday, November 20, 2020 3:50 PM To: [name], [name] [email or web address]>; Virgia Land @[email or web address]> Subject: Re: FW: Status on GM's residential placement  Hello Ms. [name] and happy Thursday.  We do not have a placement ready to  support Paul Hendricks.  I do pray that a suitable placement is found for him and his family.  Please keep [place] in mind for any other potential residential placement referrals. I do apologize for this disappointing news.    Thank you kindly. [name]  On Thu, Nov 20, 2020 at 2:06 PM [name], [name] @[email or web address]> wrote: Paul Hendricks afternoon Mr. [name] and Ms. Moody; I am just checking in with you both for status of [name] [name] home.  I received a call and email from the [facility] Social Workers asking me of the status.  Can you email me the status of [name] [name] Health and Facilities manager by [facility] and adding Carlson's [name] to [employer] [facility] [employer]'s contract?  Feel free to contact at the numbers below if you need to speak with me.  Have a great day! Thanks!   [name] [name], QP IDD Care Coordinator, [facility] [phone] (office) Tuesday and Thursday (in the office) [phone] (cell)  Monday, Wednesday, and Friday (working from home) [phone] (fax)   Zohar Maroney Tarpley-Carter, [name], LCSW-A Pronouns:  She, Her, Hers                  [name] Transitions of CareClinical Social Worker [email or web address] [phone]

## 2020-11-22 NOTE — Progress Notes (Signed)
Per chart, [facility] is still seeking placement for Pt and will continue her efforts of Tuesday July 5.

## 2020-11-22 NOTE — ED Notes (Signed)
Patient in room yelling.   

## 2020-11-23 NOTE — ED Notes (Signed)
Pt alert. Pt ate lunch.  No sitter for pt,  No sitter provided for pt.

## 2020-11-23 NOTE — ED Notes (Signed)
Pt ate breakfast. Pt alert.   No sitter for pt.  Pt unsafe.

## 2020-11-23 NOTE — ED Notes (Signed)
Pt asleep, pt autistic, pt not provided a sitter. Pt unsafe. Charge and [facility] aware.

## 2020-11-24 NOTE — ED Provider Notes (Signed)
Emergency Medicine Observation Re-evaluation Note  Paul Hendricks is a 30 y.o. male, seen on rounds today.  Pt initially presented to the ED for complaints of No chief complaint on file. Currently, the patient is patient is a awaiting placement.  Physical Exam  BP 133/82 (BP Location: Left Arm)   Pulse 94   Temp 98.1 F (36.7 C) (Axillary)   Resp 20   Ht 1.88 m (6\' 2" )   Wt 91 kg   SpO2 98%   BMI 25.76 kg/m  Physical Exam Sleeping in no distress  ED Course / MDM  EKG:   I have reviewed the labs performed to date as well as medications administered while in observation.  Recent changes in the last 24 hours include more cooperative  Plan  Current plan is for placement    , MD 11/24/20 [phone]

## 2020-11-25 NOTE — ED Notes (Signed)
Pt very agitated and loud, prn IM haldol given into pt's left deltoid.  Producer, television/film/video at bedside.

## 2020-11-25 NOTE — ED Notes (Signed)
Patient refuse V/S at this time. Will try again later.

## 2020-11-25 NOTE — ED Notes (Signed)
per previous RN (shannon V) pt agitated prior to this writer taking over care of pt.  Will attempt vitals prior to end of shift (0700).

## 2020-11-26 NOTE — ED Notes (Signed)
Pt alert this shift. Pt making noises during shift. No aggression noted. Pt eating and drinking.  Pt took medication .

## 2020-11-26 NOTE — Progress Notes (Signed)
TOC CM received communication from [place] [facility] [place]. Isidoro Donning RN CCM, [facility] ED TOC CM [phone]      Randie Heinz afternoon [place];  I have been in meetings and Webinars today and have not had a chance to work on [employer].  I will give you an update on tomorrow afternoon once I have had a chance to make some contacts with prospective providers.  Have a great afternoon!  Thanks!  [name] [name], QP IDD Care Coordinator, [facility] [phone] (office) Tuesday and Thursday (in the office) [phone] (cell)  Monday, Wednesday, and Friday (working from home) [phone] (fax)

## 2020-11-26 NOTE — ED Provider Notes (Signed)
Emergency Medicine Observation Re-evaluation Note  Paul Hendricks is a 30 y.o. male, seen on rounds today.  Pt initially presented to the ED for complaints of No chief complaint on file. Currently, the patient is taking a shower.  Physical Exam  BP (!) 141/89 (BP Location: Right Arm)   Pulse 83   Temp 98.1 F (36.7 C) (Axillary)   Resp 20   Ht 6\' 2"  (1.88 m)   Wt 91 kg   SpO2 100%   BMI 25.76 kg/m  Physical Exam General: Awake and alert Lungs: Singing in the shower Psych: Calm and cooperative  ED Course / MDM  EKG:   I have reviewed the labs performed to date as well as medications administered while in observation.  Recent changes in the last 24 hours include none.  Plan  Current plan is for placement. Patient is not under full IVC at this time.   , MD 11/26/20 [phone]

## 2020-11-27 MED ORDER — ZIPRASIDONE MESYLATE 20 MG IM SOLR: 10.0000 mg | INTRAMUSCULAR | Status: DC

## 2020-11-27 NOTE — Progress Notes (Addendum)
TOC CM received updated on pt's placement. Pt will reconsidered for placement at [facility] psych facility.   Isidoro Donning RN CCM, [facility] ED TOC CM [phone]      From: Val Eagle [email or web address]>  Sent: Wednesday, November 26, 2020 6:58 PM To: Marylou Mccoy [email or web address]>; [email or web address]; [email or web address]; '[email or web address]> Cc: Kristopher Oppenheim [email or web address]>; Dana Allan [email or web address]>; Ashok Cordia @[email or web address]>; Denton Ar @[email or web address]>; Doylene Canning @[place] .com>; Haze Justin.[email or web address]>; Toben, Stacey @[email or web address] .[email or web address]>; Tarpley-Carter, Ricquita @[email or web address]>; Christovale, Christina @[email or web address] .[email or web address]>; Camillie Smith @[email or web address]> Subject: RE: Kristopher's referral to [employer] evening Team [name]; I wanted to forward this email I received from Marylou Mccoy with [facility] to [facility].  Felicity Pellegrini is reconsidereda second assessment due to Jkai's residential placement fell through.  I will be contacting Lanora Manis tomorrow morning once I get into the office.  Thank you so much Lanora Manis for [employer] for [place] placement. I will continue to update Team [place] on when I find out anything. Have a great blessed evening! [name] [name], QP IDD Care Coordinator, [facility] [phone] (office) Tuesday and Thursday (in the office) [phone] (cell)  Monday, Wednesday, and Friday (working from home) [phone] (fax)  From: Marylou Mccoy @[email or web address]>  Sent: Wednesday, November 26, 2020 6:17 PM To: [email or web address] Cc: [name], [name] [email or web address]> Subject: Baden's referral to [facility]  This message was sent securely using Zix    Ms. Allene Dillon - Thank you for the e-mail that you  sent to me on June 29th.  I am very sorry to learn that an appropriate community placement has not been secured for [employer].  I'm sure that this is a very stressful time for Shakai, his team members, as well as his father and you.  It is certainly understandable that Geoge's current emergency placement in the [facility] ED is not a suitable setting for him.      One of the requirements for eligibility for placement at [facility] is that a reasonable exploration of community resources has been completed.  If that exploration has not been thorough or if a provider(s) is willing to offer placement to an individual, then admission to [facility] would not be warranted.  When the admissions committee reviewed Suraj's case on June 23rd, our understanding was that the Administration of [facility] had agreed to serve [place], and their staff was searching for a suitable [name] host family for him.  Therefore, he was not eligible for admission to [facility].  A response letter was mailed to you (with a copy to [facility] staff) regarding this, which you should receive today, or if not, by tomorrow.  However, since the time of our meeting, I have received information from [employer] that the [place] provider has rescinded their offer to serve [place] and, while the search for providers continues, no other options are available.  Today, I received a message from [name], requesting that, in light of these new developments, Delos's case be reconsidered.  I plan to schedule his second review during [facility] Review meeting on July 28th.    I hope that this information is helpful to you.  Please feel free to contact me if you have any questions about this information or the letter that I sent to you.    Daiva Huge Admissions/Discharge Coordinator [facility] [place] Department of Health and Human Services   Office: [phone] Fax:  [phone] [email or web address]   [address] [place], [place] [address]
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
## 2020-11-28 NOTE — ED Notes (Signed)
Patients breakfast tray held until patient awakens

## 2020-11-28 NOTE — ED Notes (Signed)
Patient took pants off and continues to [employer]

## 2020-11-28 NOTE — ED Notes (Signed)
Sitter turned off the patients lights and directed the patient to lay down.  Patient complied and is laying down on bed quietly

## 2020-11-28 NOTE — ED Notes (Signed)
Patient turned on the lights and is moaning and hitting the wall occasionally

## 2020-11-28 NOTE — ED Notes (Signed)
Pt noted to be walking/pacing around room and loudly moaning.  Pt remains in room.

## 2020-11-28 NOTE — ED Notes (Signed)
Patient awake ambulating in room making non-verbal sounds

## 2020-11-28 NOTE — ED Notes (Signed)
Patient awake / lights on / moaning

## 2020-11-28 NOTE — ED Notes (Signed)
Patient took shower.
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
## 2020-11-28 NOTE — ED Notes (Signed)
Patient received lunch tray 

## 2020-11-28 NOTE — ED Notes (Signed)
Patient calmly turned his light off in room and layed down on bed

## 2020-11-28 NOTE — ED Notes (Signed)
Patient stirring quietly on bed

## 2020-11-28 NOTE — ED Notes (Addendum)
Patient turned on lights in room  Patient moaning non/verbal language, pacing and hitting the wall while staying in the patient room

## 2020-11-28 NOTE — ED Notes (Addendum)
Pt took medication in a cupcake w/o issue and went back to sleep.

## 2020-11-28 NOTE — ED Notes (Signed)
Patient received dinner tray 

## 2020-11-28 NOTE — ED Notes (Signed)
Pt remains calm and cooperative.  Pt took medications in an oatmeal creme pie w/o issue.

## 2020-11-28 NOTE — ED Notes (Signed)
Patient is sleeping / lights out in room

## 2020-11-29 NOTE — ED Notes (Signed)
Pt took meds in cupcake icing with no issues.

## 2020-11-29 NOTE — ED Notes (Signed)
Pt in bed resting, respirations even and unlabored, no s/s of distress

## 2020-11-30 NOTE — ED Notes (Signed)
Pt got up and went into shower room and took a shower. Fresh sheets placed on bed and room cleaned by Tech. Pt went back to bed after he finished his shower.

## 2020-11-30 NOTE — ED Notes (Signed)
Pt in bed sleeping, respirations even and unlabored

## 2020-11-30 NOTE — ED Notes (Signed)
Pt sitting up in bed eating breakfast

## 2020-11-30 NOTE — ED Notes (Signed)
Pt getting agitated in room, meds given per mar
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
## 2020-11-30 NOTE — ED Notes (Signed)
Pt had a shower and  Bed changed

## 2020-12-01 NOTE — ED Provider Notes (Signed)
Emergency Medicine Observation Re-evaluation Note  Paul Hendricks is a 30 y.o. male, seen on rounds today.  Pt initially presented to the ED for complaints of No chief complaint on file. Currently, the patient is sleeping.  Physical Exam  BP (!) 141/89 (BP Location: Right Arm)   Pulse (!) 101   Temp 98.1 F (36.7 C) (Axillary)   Resp 20   Ht 6\' 2"  (1.88 m)   Wt 91 kg   SpO2 99%   BMI 25.76 kg/m  Physical Exam General: no distress Lungs: Resp even and unlabored Psych: sleeping soundly  ED Course / MDM  EKG:   I have reviewed the labs performed to date as well as medications administered while in observation.  Recent changes in the last 24 hours include none.  Plan  Current plan is for placement in LTCF. Patient is not under full IVC at this time.   , MD 12/01/20 0730

## 2020-12-01 NOTE — Progress Notes (Signed)
CSW [name] Care Coordinator [facility], in regards to placement.   This message was sent securely using Zix    *Caution - External email - see footer for warnings* Great afternoon Team BerwynGabriel; Attached is a copy of the Betzalel's weekly 5 day ER report.  Please review the attached document and let me know if you have any questions.  I will continue to email Team CubaGabriel with the results of my prospective providers contacted to assist TempletonGabriel with Residential Level 4 Residential Services.    [name] Ubaldo GlassingWorthy, BSW, QP IDD Care Coordinator, [facility] [phone] (office) Tuesday and Thursday (in the office) [phone] (cell)  Monday, Wednesday, and Friday (working from home) [phone] (fax)  CONFIDENTIALITY NOTICE: This message and any attachments included are from [facility] LME and are for sole use by the intended recipient(s). The information contained herein may include confidential or privileged information. Unauthorized review, forwarding, printing, copying, distributing, or using such information is strictly prohibited and may be unlawful. If you received this message in error, or have reason to believe you are not authorized to receive it, please contact the sender by reply email and destroy all copies of the original message. Please be advised that any e-mail sent to and from this e-mail account is subject to the [facility] Public Records Law and may be disclosed to third parties. To report fraud, waste and abuse, call the Medicaid tip-line at1-877-DMA-TIP1 ([phone]). Your call will remain anonymous.  WARNING: This email originated outside of [facility]. Even if this looks like a [facility] email, it is not. Do not provide your username, password, or any other personal information in response to this or any other email. Potter will never ask you for your username or password via email. DO NOT CLICK links or attachments unless you are  positive the content is safe. If in doubt about the safety of this message, select the C[employer] Report Phishing button, which forwards to IT Security. 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 GM's meltdowns and behavior became more challenging starting on in October 2020. 02-22-19 -refused to be seen by his dentist -fighting and tried to bite.  05-06-19   -911 was called due to meltdown.  05-15-19 -   911 called again due to meltdown.  05-23-19 -   911 called because he refused to eat and take his medication.   During this visit, they had to take out his appendix which was ready to rapture any minute according to the Doctor..  All Documentations sent already to Care Coordinator  12-16-19 -Had 2 grand mall seizures and we were asked to take him to ER if it continues.  The seizure stopped and was not taken to ER.  01/30/20 -taken to GI Dr. -referral by PCP.  02/11/20 -taken for Covid-19 test before procedure -Refused 02/12/20-Another visit for Covid-19 test before procedure -Refused 07/23/20 -Psychiatrists  adjusted his meds.  08/16/20  Visit to ER - due to swelling in his testicles -adjusted his meds also  Meltdowns and behaviors continues as of this moment including loud vocalization banging on the wall, breaking window in his room with his fist.  SIB -Biting his palm, pinching his body, hitting head on wall, door, floor, car window, refuse to take his medication (including  seizure meds), biting and attacking staff and other clients. 08/20/20 - Psychiatrist recommended a higher level of care  Current Active Diagnoses (according to FL-2):  Roman is diagnosed with Autism Spectrum Disorder; Mood Disorder; Moderate Intellectual Disability; Insomnia; Seizure Disorder and PICA  Medications Prescribed (according to FL-2):    Current Medication:  Aripiprnzole 20 Mg Tablet SIG: Give 1/2 tablet twice a day for aggression  Guanfacine 2 Mg Tablet SIG: Give 1 tablet twice a day for aggression/ODD 3. Trazodone 100 Mg Tablet SIG: Give 1 tablet twice a day(8am+8pm) for aggression/sleep/anxiety  Naltrexone 50 Mg Tablet SIG: Give 1/2 tablet twice a day for self harming behavior  Melatonin 5 Mg Fast Dissolve SIG: Give 2 tablets by mouth at bedtime as needed for sleep  Geodon 20 l\tg Capsule SIG: Give l capsule by mouth daily as needed for agitation  Clonazepam 1 Mg Tablet SIG: Take 1 tablet at (8am +4pm+ 8pm for agitation/anxiety/sleep  Mirtazapine 15 Mg Tablet  SIG: Give I tablet by mouth at bedtime for sleep/anxiety/appetite 9. Ziprasidone Hcl 60 Mg Capsule SIG: Give 1 capsule by mouth twice a day(8am+8pm) for aggression  Other MD:  Atorvnstatin 20 Mg Tablet SIG: Take 1 tablet by mouth once daily  Divalprocx Sod Er 500 Mg Tab SIG: Take 1 tablet every morning, 3 tablets by mouth at bedtime  Fish Oil 1,000 Mg Capsule 340-1,000 SIG: Take l capsule by mouth twice a day  Ondansetron Hcl 4 Mg Tablet SIG: Take 1 daily as needed for nausea  S. Topamax 25 Mg Tablet SIG: Give 1 tablet by mouth twice a day for seizure    Current Placement: Daymen is currently at [facility] He is technically homeless.  [name] officially discharged Rodriguez Camp on 10/22/20 but call 911 on Thursday, 10/23/20, due to Fue's seizures and dehydration.  [name] refused to pickup [place] when the ER was ready to release him.  Team Gillis has been looking since Care  Coordinator received Ashford's 60- day discharge notice from [name] on 08/22/20 without any success.    Guardianship Status: Makoa's parents, Conard Novak and Ed Boen are currently Erland's guardians but they are relinquishing guardianship to [facility] Department of Social Services due Essex's father recently had a stroke. Mr. Peschke is currently disable himself and Dietrick's mother indicated she is unable to provide the higher level of care he so desperately needs nor a safe place to live. Ajmal's parents are awaiting a court date for Duwane's guardianship process to be reviewed by a judge.  Nehal's parents are no longer able to continue providing the responsibility of legal guardianship for Jhovanny because neither of [place] are no longer in a position to provide a safe place for [place] to live and to provide the higher level of care Sacha requires to meet his needs.   Agencies Involved: [facility], [facility], [place], [facility] [place] ([place], [place]), [facility] Department of Social Services Adult Management consultant,  and Financial trader.   IDD Care Coordination: [name], BSW,  QP, I/DD [place] [employer] Care Coordinator  Mental Health Care Coordination:  None at this time  Acute Clinical Care Specialty Unit:  None  History of mental health treatment: Jekhi has had numerous trips to the Emergency Department due to his severe behaviors.    Case Summary Update:   Discharge notices received on 08/22/20 from [name]: 60 days Exit Notification for [employer] (GM)    With pain in our hearts, we are notifying you that [name] can no longer  provide the care that Chalfant needs at this time. We have provided quality care to [facility] since 2004 and came to this decision  due to numerous concerns that we have (refer to all updates). According to [facility]  psychiatrist Dr. Gerre Couch needs a higher level of care and  we want this to be addressed. We look forward to serving GM and his family in the future if there is a need.    [name] received written notice from one of Gorje's group home neighbors on 10/01/20, indicating that they have tried to be patience with all the noise at all time of the night but they have had enough and is going to call 911 if they are awaken in the middle of the night again by all of Aki's noise.   Meeting with Dr. Jannifer Franklin on 08/20/20 via video is as follows:  Dr. Jannifer Franklin (psychiatrist) observed [name] during video visit despite his aggression (loud vocalization, tried to bite staff -Marilu Favre, hitting and banging head on the wall, biting his fingers.  Dr. Jannifer Franklin said - "Abigail needs higher level of care at this time. The team need to start working on getting Milo a higher level of care. He is willing to provide progress notes/other info as needed when one is secured "  * Additional Info: Liz Beach ' s behaviors continue to be of concern even with increase in PRN meds and all tests came out fairly good.  Notes received from fax by Dr. Jannifer Franklin on 09/16/20 :   Chief Complaint: "His behavior has not improved even though you adjusted medications.". Patient exhibits persistent deficits in social communication and social interaction across multiple contexts as manifested by:  has poor sense of boundaries and poor language development, patient frequently his speech is incomprehensible at times and he is unable to hold age appropriate conversation.  Patient has restricted, repetitive patterns of behavior, interests, or activities as manifested by: Stereotyped or repetitive motor movements, use of objects, or speech difficulties with transitions repeating other people's words or sentences extreme distress at small changes flaps his hands often rocks back and forth walking on toes. Associated symptoms include Aggressive behaviors.  significant social anxiety.    Progress Notes: Patient is assessed via tele health for medication management follow up appointment in the presence of care giver/director of group home who reports that patient's behavior has continued to be out of contro1 even with medication adjustment. She reports that patient remains disruptive, easily agitated, aggressive, anxious, fidgety, restless and impulsive. She a]so reports that patient is also dealing with some medical issues-swollen testic1e and lower extremities. She reports that patient has had many blood test done which only revealed low iron level. Per care giver, patient's room is still padded due to engagement in self banning behaviors such as head banging, punching wall and biting himself, all of which seems to have been getting worse even on current medications. Patient is severe1y autistic, non-verbal, unable to process information or tell care giver how he feels. Care giver  has not observed any adverse reactions to his medications but says her facility can no longer take care of the patient and will discuss higher level of care with patient's family, case worker and Child psychotherapist.  According to [name] QP:  Jameson is now attacking staff and other clients which is becoming a liability and [name] can not continue to place [name]' staff and clients at risk.    Clearence's behaviors have increased and difficult to manage.  Santino is attacking direct care staff, group home staff and recently began attacking group home housemates and peers at the day support program.   Millie's direct care staff is getting burnt out and does not want to continue to work with [name] due to [employer] attacks on him.  Other [name] direct care staff refuse to work with [name] due to afraid of Alfonzia's attacks.  Johnta requires 3/2 to 1 direct care staffing in constant monitoring to prevent self-injurious behaviors and from engaging in unacceptable social  interactions such as property destruction, fecal smearing, pica, elopement, self-stimulation, aggression towards others, and loud unpredictable vocalizations.  Alec barely get 3 hours a sleep a night, otherwise Selah is awake all night.    Discharge planning: Currently Plato is at [facility]. Franklyn has been at [facility] as of today, Thursday, 11/27/20, for over a month, since 10/23/20 at 9:15 am after a meltdown and refused to take all medications including seizure medications, Jaycee had a grand mal seizure.  During seizure, 911 was called and GM is now at the Hospital.   On Tuesday, 11-25-20, Care Coordinator emailed Marylou Mccoy with [facility] and Doylene Canning with [name] Social Worker requesting their community agencies reconsider their decision of not providing Quashaun's inpatient placement due to agencies that were identified to provide [place] an [name] Residential Level 4 placement, declined at the last minute before Ithiel's move in placement date was scheduled.  The [name] potential providers indicated Mordche would not be a good fit with their other residentials and Eziah would increase the triggers of their participants.  This would be detrimental to the progress of current participants in the [name].     On Thursday, 11/20/20,  Care Coordinator received response from Pasty Arch with [facility] indicating that [facility] does not have a placement ready to support [name] but prays that a suitable placement is found for him and his family.  Jamaree needs long-term psychiatric treatment at a state hospital - Team Dejuan has felt this is necessary for the past nine months. [facility] submitted [employer] Application and Referral on Friday, 10/17/20.    [name] Emergency Room Physician states that Jerusalem appears to be baseline  (even with aggression and property destruction) and that these behaviors are directly related to his Intellectual/ Developmental Disabilities, not his Mental Health/ Illness.   Despite Team's concerns regarding community placement, 58 contacts have been made on Tajah's behalf to locate Residential Level 4 placement since 08/22/20 without success.   Due to the inability of having [name] placed at [facility], Care Coordinator will continue to contact other provider agencies to check to see if someone would be willing to provide Residential Level placement for [name] and placing [name] on [place] until a vacancy become available.  from [facility] Emergency Room department in getting him into a state hospital  for psychiatric treatment.  Residential Placement Explored:  At this point, technically Herman is homeless.  The Treatment Team at [facility] feels that community placement would be not appropriate.  It appears that due to the likelihood that he will not be admitted to [facility], community placement may be the only option.  Care Coordinator has contacted 58 providers.  (See attachment)  The Team is working diligently and as fast as possible toward getting the necessary paperwork to these agencies and keeping in contact with them to ensure that a placement is secured and he can be discharged smoothly.                           Provider Contact Number/ Email Y/N Reason Given Date         [facility] Marylou Mccoy [phone] P CC received voicemail from [place] asking CC to contact her by telephone regarding request CC made.  11/26/2020  [facility] Marylou Mccoy [phone] P CC attempted to return [employer].  No answer.  CC left message for Lanora Manis to contact CC. 11/25/2020  [facility] Marylou Mccoy [phone] P CC received email  and voicemail asking CC to contact Ms. [name] regarding Smayan's reconsideration 11/21/2020  [facility] Doylene Canning [email or web address] p  11/18/2020  [facility] Marylou Mccoy [phone] n CC emailed [facility] to request a reconsideration due to [name] decline 11/18/2020  [facility] (P) Pasty Arch [phone] N CC received email informed CC that Graylon's potential [name] declined [facility]'s placement 11/17/2020  [facility] Marylou Mccoy [phone] n [facility]'s Review Team declined [name]'s placement due to possible [name] placement 11/17/2020  [facility] (P) Pasty Arch [phone] P Emailed [facility] to check on the status of [facility] Residential Level 4 placement 11/17/2020  [facility] Review Team Marylou Mccoy [phone] P Awaiting decision from [facility] Review Team's decision on Treyvion's acceptance 11/14/2020  [facility] Review Team Marylou Mccoy [phone] P CC participated in [facility]'s Review Meeting 11/13/2020  [facility] Marylou Mccoy [phone] P Check on status of Alf's [place] Application 11/11/2020  [facility] (P) Pasty Arch [phone] y Awaiting Health & Safety Inspection on AFLs and add site to contract 11/12/2020  [facility] (P) Pasty Arch [phone] y Have located 3 [name]'s in the [place] area 11/10/2020  [facility] (P) Pasty Arch [phone] y Awaiting residential site placement for [place] area 11/07/2020  [facility] (P) Pasty Arch [phone] y Agreed to reconsider Ramari for Residential Level 4 Placement 11/06/2020  [facility] Gloris [place] [phone] N After visiting [name] in the ER & consulting Kymere's Nurse and CNA, decided Keir would not be a good fit with the  [name] home. 11/07/2020  [facility] Gloris [place] [phone] P Pending until face to face meeting with [name] at the ER 11/06/2020  [facility] Gloris [place] [phone] P Agreed to meet Team [name] to discuss residential placement for [facility] 11/03/2020  [facility] Gloris [place] [phone] N Pending until face to face meeting with [name] at the ER 11/06/2020  [facility] (P) Pasty Arch [phone] P [name] waiting to hear from [facility] (pending) 10/28/2020  [facility] (P) Pasty Arch [phone] y Agreed to consider Alf  10/29/2020  [facility] (P) Pasty Arch [phone] Liliane Bade contacted Mr. Marvelous, Bouwens guardian chose other provider 10/30/2020      [email or web address]        [facility] (P)  Theotis Barrio [phone] no No vacancies 10/25/2020  [facility] [place], [place] Chantay Jimmey Ralph [phone] No No more male beds available 10/24/2020  [facility] [place], [place]   left voicemail 10/24/2020  [facility] [place], [place] Chantay Jimmey Ralph [phone] No No more male beds available 10/24/2020      [email or web address]        [name] [place] [name] [phone] * He was just [facility] from [facility] 10/22/2020  [facility]   [phone] no No vacancies 10/22/2020  [facility] Dallie Piles [phone] no not right now 10/24/2020  [facility] Lynnell Grain [phone]   left a message 10/24/2020  [facility] Susy Frizzle [phone]   2 openings in [facility] 10/24/2020  [facility] Akinyele Igunmuyiwa [phone] no No staffing for severe behavior  10/25/2020      [email or web address] p 2 openings in [facility] 10/24/2020  [facility]   [phone]   no openings right now.   10/24/2020    Hansel Starling [phone] no   no vacancies. Can follow up 10/24/2020  [facility] Freda Munro [phone] no No vacancies 10/25/2020  [facility] [place], [place] Freda Munro [phone] no No vacancies 10/25/2020  [facility] Daisy Blossom [phone]   left a voicemail 10/24/2020    Jeral Fruit [phone]   left a voicemail 10/24/2020  [facility] Daisy Blossom [phone]   left a voicemail 10/24/2020    Jeral Fruit [phone]   left a voicemail 10/24/2020  [facility] Chelsey [email or web address] No Unable to provide staff for Croix's severe behaviors 10/23/2020      [email or web address]        [facility] ([facility]) Dahlia Client [email or web address] No Unable to provide staff for Alain's severe behaviors 10/23/2020  [place] [employer] Residential Facility [facility] [phone]            [phone] No No.  Only have a co-ed placement 10/23/2020  [facility] Loni Maness [phone] No Unable to provide staff for Marsh's severe behaviors 10/23/2020            [facility] [phone] No Lyla Son-  We don't do Residential 10/23/2020  [facility]. Lamar Thomasec [phone] No Olegario Messier- Currently, have no available spots 10/23/2020  [facility] Jefferey Pica [phone] No No available placement 6./06/2020  [facility] Jocelyn Lamer [phone] No Jocelyn Lamer, not at this time. 10/23/2020  [facility] Jimmey Ralph [phone] No They only have a male bed. 10/23/2020  [facility] (T) Danah [place], [place] [phone] 45 days No placement now.  New home in 45 days 10/23/2020    Ellard Artis [phone]   Ellard Artis.  10/23/2020  [facility] (T) [place], [place] [phone] No  Not a good match for anyone presently 10/23/2020   [facility] (T) Vidya Persad [email or web address] No No available placement 10/23/2020  [facility] [place] [phone] No  No vacancies 10/23/2020  [facility] Lindley [facility]us [phone] No extreme behaviors 09/12/2020  [facility] [place] [phone]     10/23/2020    Theresia Lo [email or web address]   emailed. Asked for behavior plan.  No response to [facility] email 10/23/2020  [facility] [place]) Kellie Simmering [phone] No Nothing available right now 10/24/2020  [employer]. Sheran Luz [phone] No Not interested 10/24/2020    Laurier Nancy [phone]   left voicemail 10/24/2020    Meryl Dare [phone]            [phone] X [phone]   left voicemail 10/24/2020  [facility] (P) April Evans [phone]   We have a level 4 group home. Have another aggressive  10/24/2020      [email or web address]   I sent information. Opening in late June or July 10/24/2020  [employer]. (P) Christin Bach [phone]     10/24/2020    America Brown     She has two openings. I sent information. 10/24/2020  [facility] (P) Ronald Pippins [phone]   left voicemail 10/24/2020      [phone]            [email or web address] sent e-mail 10/24/2020  Vesta Mixer  (P) [name] [phone]   voicemail full.  E-mail sent from within 10/24/2020  [facility] [place], [place] Ferdinand Lango [phone]   left voicemail 10/24/2020   [name].Briasia Flinders, [name], LCSWA [name]  Transitions of Care Clinical Social Worker I Direct Dial: [phone]  Fax: [phone] [name]

## 2020-12-02 NOTE — ED Notes (Signed)
Pt has been calm, cooperative, and quiet since 7:10 AM.

## 2020-12-02 NOTE — ED Provider Notes (Signed)
Emergency Medicine Observation Re-evaluation Note  Paul Hendricks is a 30 y.o. male, seen on rounds today.  Pt initially presented to the ED for complaints of No chief complaint on file. Currently, the patient is sleeping.  Physical Exam  BP (!) 141/89 (BP Location: Right Arm)   Pulse (!) 107   Temp 98.1 F (36.7 C) (Axillary)   Resp 16   Ht 6\' 2"  (1.88 m)   Wt 91 kg   SpO2 100%   BMI 25.76 kg/m  Physical Exam General: No acute distress Cardiac: Well-perfused Lungs: Nonlabored Psych: Sleeping  ED Course / MDM  EKG:   I have reviewed the labs performed to date as well as medications administered while in observation.  Recent changes in the last 24 hours include TOC working on placement.  Plan  Current plan is for placement. Patient is not under full IVC at this time.   , MD 12/02/20 [phone]

## 2020-12-02 NOTE — Progress Notes (Signed)
CSW emailed [name] [name]- Care Coordinator [facility], requested notes on pt behaviors.    [name].Katsumi Wisler, [name], LCSWA [name]  Transitions of Care Clinical Social Worker I Direct Dial: [phone]  Fax: [phone] [name]

## 2020-12-02 NOTE — ED Notes (Signed)
Since 7:10 AM, pt has been calm, cooperative, and resting in bed.

## 2020-12-03 NOTE — ED Notes (Signed)
Pt has been calm and cooperative since 7 am.

## 2020-12-03 NOTE — ED Notes (Signed)
Pt has been calm and cooperative since 7am. Pt quietly rested in his bed for most of the day.

## 2020-12-03 NOTE — ED Provider Notes (Signed)
Emergency Medicine Observation Re-evaluation Note  Paul Hendricks is a 30 y.o. male, seen on rounds today.  Pt initially presented to the ED for complaints of No chief complaint on file. Currently, the patient is awaiting placement they are looking at placement at [facility].  Physical Exam  BP (!) 141/89 (BP Location: Right Arm)   Pulse (!) 107   Temp 98.1 F (36.7 C) (Axillary)   Resp 16   Ht 1.88 m (6\' 2" )   Wt 91 kg   SpO2 100%   BMI 25.76 kg/m  Physical Exam General: No acute distress Cardiac: Regular rate and rhythm Lungs: No acute respiratory distress Psych: Resting  ED Course / MDM  EKG:   I have reviewed the labs performed to date as well as medications administered while in observation.  Recent changes in the last 24 hours include they are [facility]  Plan  Current plan is for him will be a behavioral health admission they are looking for placement. Patient is under full IVC at this time.   , MD 12/03/20 [phone]

## 2020-12-04 NOTE — ED Notes (Addendum)
Patient with increased agitation.  In room stomping and yelling.  Staff unable to redirect.

## 2020-12-04 NOTE — Progress Notes (Signed)
CSW received e-mail from [facility], in regards to placement.  This message was sent securely using Zix    *Caution - External email - see footer for warnings* Great morning Team [name]: I am emailing you my discussion I had on yesterday, Wednesday, 12-03-20, with  Paul Hendricks of [name] regarding providing Residential Level 4 care to Paul Hendricks once Paul Hendricks had been assessed and his medication had stabilized [name]    [name] indicated that the parents of the clients who stayed downstairs in the group home didn't want to place their family members at risk (of being harmed or triggering their family members' severe behaviors) with Paul Hendricks living and having access to their family members at the group home.    [name] also indicated that she had spoken to Paul Hendricks and they indicated they were burnt out and didn't want to work with [name] again.    [name] stated that a group home setting was not appropriate for Paul Hendricks and suggested Paul Hendricks be placed in an [name] placement.  [name] has spoken to a few [name] providers to see if they would care for Paul Hendricks if [name] would place their [name] under [name] but she has not found anyone willing to care for Paul Hendricks in their home.    CC stated CC would inform [name] that [name] is not willing to provide Paul Hendricks with Residential Level 4 in one of their group homes but would consider contracting with an [name] provider who is willing to provide [name]  Residential Level 4 services to [place].    CC asked [name] to keep me updated if she is able to find someone to provide [name] Residential Level 4 placement for Paul Hendricks.  If any member of [name] have any suggestions that would help CC locate a possible [name] Residential Level 4 placement, it would be greatly appreciated.  Have a great day! Thanks! Paul Hendricks, QP IDD Care Coordinator, [name] [phone] (office) Tuesday and Thursday (in the  office) [phone] (cell)  Monday, Wednesday, and Friday (working from home) [phone] (fax)  CONFIDENTIALITY NOTICE: This message and any attachments included are from [name] and are for sole use by the intended recipient(s). The information contained herein may include confidential or privileged information. Unauthorized review, forwarding, printing, copying, distributing, or using such information is strictly prohibited and may be unlawful. If you received this message in error, or have reason to believe you are not authorized to receive it, please contact the sender by reply email and destroy all copies of the original message. Please be advised that any e-mail sent to and from this e-mail account is subject to the [facility] Public Records Law and may be disclosed to third parties. To report fraud, waste and abuse, call the Medicaid tip-line at1-877-DMA-TIP1 (236-036-7548). Your call will remain anonymous.  WARNING: This email originated outside of [facility]. Even if this looks like a FedEx, it is not. Do not provide your username, password, or any other personal information in response to this or any other email. [facility] will never ask you for your username or password via email. DO NOT CLICK links or attachments unless you are positive the content is safe. If in doubt about the safety of this message, select the Cof[employer] Report Phishing button, which forwards to IT Security.  This message was secured by [employer].     Paul Hendricks.Paul Hendricks, [name], LCSWA Paul [facility]  Transitions of Care Clinical Social Worker I Direct  Dial: [phone]  Fax: [phone] [name]

## 2020-12-04 NOTE — ED Provider Notes (Signed)
Emergency Medicine Observation Re-evaluation Note  Paul Hendricks is a 30 y.o. male, seen on rounds today.  Pt initially presented to the ED for complaints of agitation Currently, the patient is in no distress, resting in left lateral decubitus position.  Physical Exam  BP (!) 141/89 (BP Location: Right Arm)   Pulse (!) 109   Temp 98.1 F (36.7 C) (Axillary)   Resp 18   Ht 6\' 2"  (1.88 m)   Wt 91 kg   SpO2 96%   BMI 25.76 kg/m  Physical Exam General: No distress Cardiac: Regular rate and rhythm Lungs: No increased work of breathing Psych: Calm, no distress  ED Course / MDM  EKG:   I have reviewed the labs performed to date as well as medications administered while in observation.  Recent changes in the last 24 hours include none according to bedside sitter.  Plan  Current plan is for behavioral health placement. Patient is under full IVC at this time.   , MD 12/04/20 [phone]

## 2020-12-04 NOTE — ED Notes (Signed)
Staff members say pt will occasionally eat meat.Two days ago, pt ate chicken with gravy. He has previously eaten chicken tenders. Changing diet order to include meat.

## 2020-12-05 NOTE — ED Notes (Signed)
Pt is showering, squealing, and making loud verbalizations. He is biting his hand while in the bathroom.

## 2020-12-05 NOTE — ED Provider Notes (Signed)
Emergency Medicine Observation Re-evaluation Note  Paul Hendricks is a 30 y.o. male, seen on rounds today.  Pt initially presented to the ED for complaints of No chief complaint on file. Currently, the patient is sitting upright, no distress.  Physical Exam  BP 96/72 (BP Location: Right Wrist)   Pulse 84   Temp 98.1 F (36.7 C) (Axillary)   Resp 16   Ht 6\' 2"  (1.88 m)   Wt 91 kg   SpO2 99%   BMI 25.76 kg/m  Physical Exam General: No distress, sitting upright Cardiac: Regular rate and rhythm, improved from tachycardia Lungs: No increased work of breathing Psych: Minimally interactive  ED Course / MDM  EKG:   I have reviewed the labs performed to date as well as medications administered while in observation.  Recent changes in the last 24 hours include none.  Plan  Current plan is for placement. Patient is under full IVC at this time.   , MD 12/05/20 [phone]

## 2020-12-05 NOTE — ED Provider Notes (Signed)
Emergency Medicine Observation Re-evaluation Note  Paul Hendricks is a 30 y.o. male, seen on rounds today.  Pt initially presented to the ED for complaints of agitation Currently, the patient is resting in left lateral decubitus position no distress.  Physical Exam  BP 96/72 (BP Location: Right Wrist)   Pulse 84   Temp 98.1 F (36.7 C) (Axillary)   Resp 16   Ht 6\' 2"  (1.88 m)   Wt 91 kg   SpO2 99%   BMI 25.76 kg/m  Physical Exam General: Regular rate and rhythm Cardiac: Regular rate and rhythm Lungs: No increased work of breathing Psych: Withdrawn  ED Course / MDM  EKG:   I have reviewed the labs performed to date as well as medications administered while in observation.  Recent changes in the last 24 hours include none, patient had a shower.  Plan  Current plan is for placement. Patient is under full IVC at this time.   , MD 12/05/20 [phone]

## 2020-12-06 NOTE — ED Provider Notes (Signed)
Emergency Medicine Observation Re-evaluation Note  Paul Hendricks is a 30 y.o. male, seen on rounds today.  Pt initially presented to the ED for complaints of No chief complaint on file. Currently, the patient is patient is awaiting placement.  Physical Exam  BP (!) 150/101 (BP Location: Left Arm)   Pulse (!) 128   Temp 98.1 F (36.7 C) (Axillary)   Resp 18   Ht 1.88 m (6\' 2" )   Wt 91 kg   SpO2 98%   BMI 25.76 kg/m  Physical Exam General: Sleeping   ED Course / MDM  EKG:   I have reviewed the labs performed to date as well as medications administered while in observation.  Recent changes in the last 24 hours include no recent changes.  Plan  Current plan is for placement. Patient is under full IVC at this time.   , MD 12/06/20 [phone]

## 2020-12-06 NOTE — ED Notes (Signed)
Pt has been agitated at times today. Meds given per mar for agitation. Regular meds given crushed and given in pudding.

## 2020-12-06 NOTE — ED Notes (Signed)
Pt has been resting this shift. Eats with no issues. Pt became agitated late this shift and meds given per [facility]

## 2020-12-07 NOTE — Progress Notes (Signed)
TOC CSW received the following email update from [facility].   From: [name], [name] [email or web address]  Sent: Thursday, December 04, 2020 11:22 AM To: Conard Novak [email or web address]>; '[email or web address]>; [email or web address] Cc: Doylene [email or web address]>; Christovale, Christina [email or web address]>; Tarpley-Carter, Florence Yeung [email or web address]>; Sharol Roussel [email or web address]>; Camillie Smith [email or web address]>; Kristopher Oppenheim [email or web address]>; Dana Allan [email or web address]>; Isidoro Donning [email or web address]>; Pricilla Loveless, Aloysius [email or web address]>; Denton Ar [email or web address]>; [email or web address] Subject: Update on Kharson's [name] Residential Level 4 placement  This message was sent securely using Zix    *Caution - External email - see footer for warnings* Great morning Team [name]: I am emailing you my discussion I had on yesterday, Wednesday, 12-03-20, with  [name] of [name] regarding providing Residential Level 4 care to [place] once [place] had been assessed and his medication had stabilized [name]    [name] indicated that the parents of the clients who stayed downstairs in the group home didn't want to place their family members at risk (of being harmed or triggering their family members' severe behaviors) with [facility] living and having access to their family members at the group home.    [name] also indicated that she had spoken to [name] and they indicated they were burnt out and didn't want to work with [name] again.    [name] stated that a group home setting was not appropriate for Dsean and suggested Crecencio be placed in an [name] placement.  [name] has spoken to a few [name] providers to see if they would care for Amauris if [name] would place their [name] under [name] but  she has not found anyone willing to care for [employer] in their home.    CC stated CC would inform Team Khye that [facility] is not willing to provide Ila with Residential Level 4 in one of their group homes but would consider contracting with an [name] provider who is willing to provide [name]  Residential Level 4 services to [place].    CC asked [name] to keep me updated if she is able to find someone to provide [name] Residential Level 4 placement for [facility].  If any member of Team Vyron have any suggestions that would help CC locate a possible [name] Residential Level 4 placement, it would be greatly appreciated.  Have a great day! Thanks! [name] [name], QP IDD Care Coordinator, [facility] [phone] (office) Tuesday and Thursday (in the office) [phone] (cell)  Monday, Wednesday, and Friday (working from home) [phone] (fax)  Jearld Fenton, [name], LCSW-A Pronouns:  She/Her/Hers                          [place] Clinical Social WorkerTransitions of Care Cell:  [phone] [email or web address]

## 2020-12-07 NOTE — ED Provider Notes (Signed)
Emergency Medicine Observation Re-evaluation Note  Paul Hendricks is a 30 y.o. male, seen on rounds today.  Pt initially presented to the ED for complaints of No chief complaint on file. Currently, the patient is resting in bed.  Physical Exam  BP (!) 150/101 (BP Location: Left Arm)   Pulse (!) 128   Temp 98.1 F (36.7 C) (Axillary)   Resp 18   Ht 1.88 m (6\' 2" )   Wt 91 kg   SpO2 98%   BMI 25.76 kg/m  Physical Exam General: Calm   Psych: Blunted, appears at baseline  ED Course / MDM  EKG:   I have reviewed the labs performed to date as well as medications administered while in observation.  Recent changes in the last 24 hours include none.  Plan  Current plan is for placement. Patient is not under full IVC at this time.   , MD 12/07/20 [phone]

## 2020-12-07 NOTE — ED Notes (Signed)
Pt has been sleeping most of the shift. Pt became agitated early in the shift prn meds given per mar

## 2020-12-08 NOTE — ED Provider Notes (Signed)
Emergency Medicine Observation Re-evaluation Note  Paul Hendricks is a 30 y.o. male, seen on rounds today.  Pt initially presented to the ED for complaints of No chief complaint on file. Currently, the patient is calm.  Physical Exam  BP (!) 150/101 (BP Location: Left Arm)   Pulse (!) 128   Temp 98.1 F (36.7 C) (Axillary)   Resp 18   Ht 6\' 2"  (1.88 m)   Wt 91 kg   SpO2 98%   BMI 25.76 kg/m  Physical Exam General: NAD Cardiac: tachycardic  Lungs: no increased WOB Psych: calm  ED Course / MDM  EKG:   I have reviewed the labs performed to date as well as medications administered while in observation.  Recent changes in the last 24 hours include none.  Plan  Current plan is for placment. Patient is not under full IVC at this time.   , MD 12/08/20 [phone]

## 2020-12-08 NOTE — Progress Notes (Signed)
CSW received e-mail from [facility], in regards to placement.  This message was sent securely using Zix    *Caution - External email - see footer for warnings* Great morning Team [name];  I am working on [employer] 5 day Emergency Room Report and will email it out to all the members on [employer].  I would like to have a telephone conference meeting with Team Zackery sometime this week.to see what is our next step and plan of action for K[place]en since it seems like we can not find a provider agency to care for [place]. I know that Howie is not the only participant with severe behaviors needing care.    I spoke with Sharol Roussel, one of the social workers at [facility] this morning to discuss Eligha's crisis situation.  She indicated that the physicians at the ER have stabilized [name] as much as they could while Khi has been at the ER but his behaviors are a result of his diagnosis and they are unable refer inpatient treatment for [facility].    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 Technically Aidenn is "homeless".   This doesn't mean that I am giving up trying to find [place] somewhere, I am more determined as ever to searching and locating a placement for Paauilo.  It truly breaks my heart to know that Octavio has been in that room at the ER for almost 2 months. I can only imagine how afraid Everest is with his unfamiliar surroundings and caretaker.  This only intensifies Kamdon's severe behaviors.    Ms. Allene Dillon, I know that you wanted Keghan to be placed in [facility] or in one of the surrounding counties but it doesn't appear that the providers in our area counties are willing to provide [place] with the care and services he requires and needs. So, I believe my next  step will be contacting other MCOs in the state of [place] to see if they are able to provide [place] with Residential Level 4 services.        I apologize for this long and emotional email but I am so frustrated that I am unable to help and support Artavious and his family in [place] hectic and stressful emergency crisis situation.  I will continue to update you on my efforts and the result of my efforts.  Have a great day!  Thanks! [name] [name], QP IDD Care Coordinator, [facility] [phone] (office) Tuesday and Thursday (in the office) [phone] (cell)  Monday, Wednesday, and Friday (working from home) [phone] (fax)  CONFIDENTIALITY NOTICE: This message and any attachments included are from [facility] and are for sole use by the intended recipient(s). The information contained herein may include confidential or privileged information. Unauthorized review, forwarding, printing, copying, distributing, or using such information is strictly prohibited and may be unlawful. If you received this message in error, or have reason to believe you are not authorized to receive it, please contact the sender by reply email and destroy all copies of the original message. Please be advised that any e-mail sent to and from this e-mail account is subject to the [facility] Public Records Law and may be disclosed to third parties. To report fraud, waste and abuse, call the Medicaid tip-line at1-877-DMA-TIP1 (2065179139). Your  call will remain anonymous.  WARNING: This email originated outside of [facility]. Even if this looks like a FedEx, it is not. Do not provide your username, password, or any other personal information in response to this or any other email. [facility] will never ask you for your username or password via email. DO NOT CLICK links or attachments unless you are positive the content is safe. If in doubt about the safety of this message, select the  Cofense Report Phishing button, which forwards to IT Security.  This message was secured by Zix.    [name].Emillia Weatherly, [name], LCSWA [name]  Transitions of Care Clinical Social Worker I Direct Dial: [phone]  Fax: [phone] [name]

## 2020-12-08 NOTE — ED Notes (Signed)
Pt resting at this time.  Will obtain VS once pt wakes.

## 2020-12-08 NOTE — ED Notes (Signed)
Patient in room stomping and moaning loudly.

## 2020-12-09 NOTE — ED Provider Notes (Signed)
Emergency Medicine Observation Re-evaluation Note  Stratton Villwock is a 30 y.o. male, seen on rounds today.  Pt initially presented to the ED for complaints of agitation. Currently, the patient is sleeping, no distress.  Physical Exam  BP (!) 150/101 (BP Location: Left Arm)   Pulse (!) 128   Temp 98.1 F (36.7 C) (Axillary)   Resp 20   Ht 6\' 2"  (1.88 m)   Wt 91 kg   SpO2 98%   BMI 25.76 kg/m  Physical Exam General: No distress, sleeping, awakens easily Cardiac: Regular rate and rhythm Lungs: No increased work of breathing Psych: Withdrawn  ED Course / MDM  EKG:   I have reviewed the labs performed to date as well as medications administered while in observation.  Recent changes in the last 24 hours include none.  Plan  Current plan is for placement. Patient is under full IVC at this time.   , MD 12/09/20 [phone]

## 2020-12-09 NOTE — ED Notes (Signed)
Pt up and eating dinner

## 2020-12-09 NOTE — ED Notes (Signed)
Pt had shower, linens changed and floor swept.

## 2020-12-10 NOTE — ED Provider Notes (Signed)
Emergency Medicine Observation Re-evaluation Note  Paul Hendricks is a 30 y.o. male, seen on rounds today.  Pt initially presented to the ED for complaints of No chief complaint on file. Currently, the patient is resting quietly.  Patient has been up.  He has eaten his meal tray.  He is ambulatory to the bathroom.  He did get agitated earlier in the morning and apparently charged the door and the staff.  He did however except his medications and is now resting quietly.  Physical Exam  BP (!) 150/101 (BP Location: Left Arm)   Pulse (!) 128   Temp 98.1 F (36.7 C) (Axillary)   Resp 20   Ht 6\' 2"  (1.88 m)   Wt 91 kg   SpO2 98%   BMI 25.76 kg/m  Physical Exam General: Sleeping, no respiratory distress   ED Course / MDM  EKG:   I have reviewed the labs performed to date as well as medications administered while in observation.  Recent changes in the last 24 hours include none.  Plan  Current plan is for placement.  Karry Causer is not under involuntary commitment.     [name], MD 12/10/20 1224

## 2020-12-10 NOTE — ED Notes (Signed)
Pt resting with eyes closed. Equal rise and fall of chest. Sitter outside of room. 1:1.

## 2020-12-10 NOTE — ED Notes (Signed)
Patient is becoming increasingly agitated and yelling out. Patient is becoming louder and is disruptive to unit. IM medications

## 2020-12-11 NOTE — ED Notes (Signed)
Unable to obtain vitals due to pt behavior

## 2020-12-11 NOTE — Progress Notes (Signed)
CSW send e-mail out to [facility]  requesting update on placement status, awaiting response.   Paul Hendricks.Paul Hendricks, [name], LCSWA [facility] Paul Hendricks  Transitions of Care Clinical Social Worker I Direct Dial: [phone]  Fax: [phone] [name]

## 2020-12-12 NOTE — ED Notes (Signed)
Patient currently in room making loud noises disturbing the milieu.  Unable to be redirected.

## 2020-12-12 NOTE — ED Notes (Signed)
Pt has been alert this shift. Pt has slept most of the shift. Pt took AM medication. Pt cooperative with care and redirection. Pt s/s of distress or agitation.

## 2020-12-13 NOTE — ED Notes (Signed)
Pt alert this shift. Pt nonverbal. Needs direction. Pt received AM medication. No aggression noted

## 2020-12-13 NOTE — ED Notes (Signed)
Patient beating on window with closed fist.   

## 2020-12-14 NOTE — ED Notes (Signed)
Patient in room making loud noises and stomping on floor.  Unable to be redirected by staff.

## 2020-12-14 NOTE — ED Notes (Signed)
Patient turned off light and is currently back in bed lying down.

## 2020-12-14 NOTE — ED Provider Notes (Signed)
Emergency Medicine Observation Re-evaluation Note  Paul Hendricks is a 30 y.o. male, seen on rounds today.  Pt initially presented to the ED for complaints of No chief complaint on file. Currently, the patient is resting comfortably.  Physical Exam  BP 121/90 (BP Location: Left Arm)   Pulse (!) 113   Temp 98 F (36.7 C) (Oral)   Resp 18   Ht 6\' 2"  (1.88 m)   Wt 91 kg   SpO2 95%   BMI 25.76 kg/m  Physical Exam General: asleep, comfortable   ED Course / MDM  EKG:   I have reviewed the labs performed to date as well as medications administered while in observation.  Recent changes in the last 24 hours include no acute events reported by staff.  Plan  Current plan is for placement.  Paul Hendricks is not under involuntary commitment.     Paul Pastures, MD 12/14/20 [phone]

## 2020-12-14 NOTE — ED Notes (Signed)
Patient turned on light and is currently sitting on side of bed.

## 2020-12-15 NOTE — ED Notes (Signed)
MHT and security took patient on a walk outside.

## 2020-12-15 NOTE — ED Provider Notes (Signed)
Emergency Medicine Observation Re-evaluation Note  Paul Hendricks is a 30 y.o. male, seen on rounds today.  Pt initially presented to the ED for complaints of No chief complaint on file. Currently, the patient is in bed resting.  Physical Exam  BP 121/90 (BP Location: Left Arm)   Pulse (!) 113   Temp 98 F (36.7 C) (Oral)   Resp 18   Ht 6\' 2"  (1.88 m)   Wt 91 kg   SpO2 95%   BMI 25.76 kg/m  Physical Exam General: nad Lungs: no distress Psych: cooperative  ED Course / MDM  EKG:   I have reviewed the labs performed to date as well as medications administered while in observation.  Recent changes in the last 24 hours include none.  Plan  Current plan is for group home placement.  Paul Hendricks is not under involuntary commitment.     [name], MD 12/15/20 [phone]

## 2020-12-15 NOTE — Progress Notes (Signed)
CSW received an e-mail from [facility] in regards to placement.   12/12/20  This message was sent securely using Zix    *Caution - External email - see footer for warnings* Great afternoon Team Valarie Merino; I am forwarding the email I received from Ms. Latvia with [employer] stating her team met and decided that [facility] was unable to provide Residential Level 4 services to [place].  Ms. Megan Salon provided me with a few other providers to contact.  I will be contacting them on Monday.  I will email each of you when I hear something.   I will also try my best to complete [place]'s isp update for [employer] services next week. Have a great afternoon and weekend. Thanks! [name] Julien Nordmann, [place], [facility] [phone] (office) Tuesday and Thursday (in the office) [phone] (cell)  Monday, Wednesday, and Friday (working from home) [phone] (fax)

## 2020-12-15 NOTE — ED Notes (Signed)
Patient in room making loud moaning noises.

## 2020-12-15 NOTE — ED Notes (Signed)
Pt alert this shift. No aggression or agitation noted. Pt took medication this shift. Pt follows directions.

## 2020-12-15 NOTE — ED Notes (Signed)
Patient in room hitting self in head and beating wall with hand.

## 2020-12-16 LAB — RESP PANEL BY RT-PCR (FLU A&B, COVID) ARPGX2
Influenza A by PCR: NEGATIVE
Influenza B by PCR: NEGATIVE
SARS Coronavirus 2 by RT PCR: NEGATIVE

## 2020-12-16 NOTE — ED Provider Notes (Signed)
  Physical Exam  BP 121/90 (BP Location: Left Arm)   Pulse (!) 113   Temp 98 F (36.7 C) (Oral)   Resp 18   Ht 6\' 2"  (1.88 m)   Wt 91 kg   SpO2 95%   BMI 25.76 kg/m   Physical Exam  ED Course/Procedures     Procedures  MDM  Patient is nonverbal autistic.  Called to see patient because reported a change in activity.  Had been laying on the floor after having apparent large bowel movement in the bathroom.  Patient cannot tell me if he is feeling bad.  Per nursing had apparently been rubbing his abdomen.  Ports he does have history of constipation.  Has had MiraLAX but reportedly not taking Colace.  Has been here for almost 1300 hrs. Will attempt to feed patient and see how he does.       , MD 12/16/20 [phone]

## 2020-12-16 NOTE — ED Notes (Signed)
Pt agitated and kicking wall. Pt not redirectable.

## 2020-12-16 NOTE — ED Notes (Signed)
Upon assessment of pt this am, pt moaning and rubbing his stomach. MD aware

## 2020-12-16 NOTE — ED Notes (Signed)
Pt agitated and pacing room while shouting nonverbal syllables. Pt able to be re-directed back to bed at this time.

## 2020-12-16 NOTE — Progress Notes (Signed)
CSW received commutation from [employer] in regards to placement.she reports still looking for placement, there is a onsideration telephone conference meeting on 12/18/2020 from 2 to 3pm, to see if pt's meeting requirements for [facility] waiting list.   [name].Shaila Gilchrest, [name], LCSWA [name]  Transitions of Care Clinical Social Worker I Direct Dial: [phone]  Fax: [phone] [name]

## 2020-12-16 NOTE — ED Notes (Signed)
Pt laying on floor by bathroom. RN gave pt saltine crackers and cranberry juice and pt ate with no issues.

## 2020-12-16 NOTE — ED Notes (Signed)
Pt refused vitals. Pt encouraged to allow RN to get a temperature, pt declined. Pt's pulse when palpated was 94 and regular.

## 2020-12-17 MED ORDER — POLYETHYLENE GLYCOL 3350 17 G PO PACK
17.0000 g | PACK | Freq: Two times a day (BID) | ORAL | Status: DC
  Administered 2020-12-17 – 2020-12-19 (×6): 17 g via ORAL
  Administered 2020-12-20: 10 g via ORAL
  Administered 2020-12-20 – 2021-05-10 (×163): 17 g via ORAL
  Filled 2020-12-17 (×294): qty 1

## 2020-12-17 NOTE — ED Provider Notes (Signed)
  Physical Exam  BP 121/90 (BP Location: Left Arm)   Pulse (!) 113   Temp 98 F (36.7 C) (Oral)   Resp 18   Ht 6\' 2"  (1.88 m)   Wt 91 kg   SpO2 95%   BMI 25.76 kg/m   Physical Exam  ED Course/Procedures     Procedures  MDM  Patient with 1 episode of vomiting.  Discussed with nursing I think it could be related to his anxiety.  Had eaten yesterday although they have not given him much food.  Had been not taking his Colace.  Will increase magnesium citrate to twice a day.  Potentially has been having more issues with constipation although did have a bowel movement yesterday and could be some belly pain although cannot necessarily tell me he has belly pain.  No fevers.  We will continue to monitor.  If needs blood work however would likely be an ordeal since he would like to have it not done.       , MD 12/17/20 [phone]

## 2020-12-17 NOTE — ED Notes (Signed)
Pt has been alert this shift.  No aggression or agitation noted.  Pt took AM medication.  Pt cooperative with redirection. Pt needs prompting with ADLs.  Pt ambulatory.

## 2020-12-17 NOTE — ED Notes (Signed)
RN informed by sitter that pt has small amount of liquid emesis. Pt appears pale and agitated, biting right hand and vocalizing. MHT directed pt to quick shower while bed and table wiped. Scrubs changed. Attempted to obtain VS with no success. Uncertain if pt feels warm to touch.

## 2020-12-18 NOTE — ED Notes (Signed)
Medicated as ordered for agitation and screaming out

## 2020-12-18 NOTE — ED Notes (Signed)
Patient refusing vital signs taken at this time

## 2020-12-18 NOTE — ED Provider Notes (Signed)
Emergency Medicine Observation Re-evaluation Note  Paul Hendricks is a 30 y.o. male, seen on rounds today.  Pt initially presented to the ED for complaints of No chief complaint on file. Currently, the patient is sleeping.  Physical Exam  BP (!) 110/92 (BP Location: Left Arm)   Pulse 77   Temp (!) 97.5 F (36.4 C) (Axillary)   Resp 16   Ht 6\' 2"  (1.88 m)   Wt 91 kg   SpO2 100%   BMI 25.76 kg/m  Physical Exam General: No acute distress Cardiac: Well-perfused Lungs: Nonlabored  ED Course / MDM  EKG:   I have reviewed the labs performed to date as well as medications administered while in observation.  Recent changes in the last 24 hours include social work working towards placement.  Plan  Current plan is for placement.  Paul Hendricks is not under involuntary commitment.     Paul Pastures, MD 12/18/20 [phone]
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
## 2020-12-19 NOTE — Progress Notes (Signed)
CSW attempted to contact [facility] for update on placement , no response left HIPPA complaint VM.   [name].Akeila Lana, [name], LCSWA [name]  Transitions of Care Clinical Social Worker I Direct Dial: [phone]  Fax: [phone] [name]

## 2020-12-19 NOTE — ED Notes (Signed)
Patient in room kicking on the wall.

## 2020-12-19 NOTE — ED Notes (Signed)
Patient refused vitals.

## 2020-12-19 NOTE — ED Notes (Signed)
Patient in room making loud noise and stomping on the floor.  Unable to be redirected by staff.

## 2020-12-19 NOTE — ED Provider Notes (Signed)
Emergency Medicine Observation Re-evaluation Note  Paul Hendricks is a 30 y.o. male, seen on rounds today.  Pt initially presented to the ED for complaints of behavioral disturbance Currently, the patient is under IVC pending placement.    Patient has had continued daily outbursts in the ED.  Physical Exam  BP 106/75 (BP Location: Left Wrist)   Pulse 91   Temp 98 F (36.7 C) (Oral)   Resp 20   Ht 6\' 2"  (1.88 m)   Wt 91 kg   SpO2 99%   BMI 25.76 kg/m  Physical Exam General:NAD Cardiac: Regular rate and rhythm Lungs: No respiratory distress Psych: STable  ED Course / MDM  EKG:   I have reviewed the labs performed to date as well as medications administered while in observation.    Plan  Current plan is for placement, which has been quite difficulty.  SW assisting and we appreciate their diligent efforts.   [facility] is under involuntary commitment.     [name], MD 12/19/20 [phone]

## 2020-12-19 NOTE — ED Notes (Signed)
Pt alert this shift. No aggression noted. Medication was taken this AM.  Pt ate breakfast and lunch.  Pt took shower.
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
## 2020-12-20 NOTE — ED Notes (Addendum)
Pt in room making loud noises and banging on glass in the door. Directed him away from the door. He sat on the floor and bit his hand.

## 2020-12-20 NOTE — ED Provider Notes (Signed)
Emergency Medicine Observation Re-evaluation Note  Paul Hendricks is a 30 y.o. male, seen on rounds today.  Pt initially presented to the ED for complaints of No chief complaint on file. Currently, the patient is resting comfortably.  Physical Exam  BP 106/75 (BP Location: Left Wrist)   Pulse 91   Temp 98 F (36.7 C) (Oral)   Resp 20   Ht 6\' 2"  (1.88 m)   Wt 91 kg   SpO2 99%   BMI 25.76 kg/m  Physical Exam General: Nontoxic appearance Cardiac: Normal heart rate Lungs: Normal respiratory rate Psych: No internal responsiveness  ED Course / MDM  EKG:   I have reviewed the labs performed to date as well as medications administered while in observation.  Recent changes in the last 24 hours include cooperative.  Plan  Current plan is for placement by social work.  Paul Hendricks is not under involuntary commitment.     [name], MD 12/20/20 [phone]

## 2020-12-21 NOTE — ED Notes (Signed)
Patient came out of the room yelling. Staff redirected the patient back to the room and he laid down on the bed.

## 2020-12-21 NOTE — ED Notes (Signed)
Patient began yelling loudly while in the room. A couple of pieces of bread were offered to him, which he held in his hand for a moment, then gave them back.

## 2020-12-21 NOTE — ED Notes (Signed)
Patient yelling out again. A slice of bread was again offered, but again he didn't want it.

## 2020-12-21 NOTE — ED Notes (Signed)
Pt was given an oatmeal creme pie with his nightly medications in it. He ate it completely.
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
## 2020-12-21 NOTE — ED Notes (Signed)
Patient calm at this time

## 2020-12-21 NOTE — ED Notes (Signed)
Patient sleeping at this time.

## 2020-12-22 DIAGNOSIS — F840 Autistic disorder: Secondary | ICD-10-CM | POA: Diagnosis not present

## 2020-12-22 DIAGNOSIS — Z20822 Contact with and (suspected) exposure to covid-19: Secondary | ICD-10-CM | POA: Diagnosis not present

## 2020-12-22 DIAGNOSIS — R569 Unspecified convulsions: Secondary | ICD-10-CM | POA: Diagnosis present

## 2020-12-22 DIAGNOSIS — Z79899 Other long term (current) drug therapy: Secondary | ICD-10-CM | POA: Diagnosis not present

## 2020-12-22 DIAGNOSIS — G40909 Epilepsy, unspecified, not intractable, without status epilepticus: Secondary | ICD-10-CM | POA: Diagnosis not present

## 2020-12-22 NOTE — ED Provider Notes (Signed)
Emergency Medicine Observation Re-evaluation Note  Paul Hendricks is a 30 y.o. male, seen on rounds today.  Pt initially presented to the ED for complaints of No chief complaint on file. Currently, the patient is awake and comfortable.  Physical Exam  BP 135/88 (BP Location: Right Arm)   Pulse (!) 103   Temp 98 F (36.7 C) (Oral)   Resp 18   Ht 6\' 2"  (1.88 m)   Wt 91 kg   SpO2 99%   BMI 25.76 kg/m  Physical Exam General: nad Lungs: no distress Psych: calm  ED Course / MDM  EKG:   I have reviewed the labs performed to date as well as medications administered while in observation.  Recent changes in the last 24 hours include none.  Plan  Current plan is for group  home placement.  Paul Hendricks is not under involuntary commitment.     Paul Pastures, MD 12/22/20 [phone]

## 2020-12-22 NOTE — ED Notes (Signed)
Patient is resting comfortably. 

## 2020-12-22 NOTE — Progress Notes (Signed)
CSW received an e-mail from [facility] in regards to placement.  This message was sent securely using Zix    *Caution - External email - see footer for warnings* Great morning Team [place]; The meeting for [facility] with [facility] went great. Karen accepted for first part to get into [facility].  [facility] will have the second part (clinical assessment) for Ruhaan's on the 3rd Thursday of August (01/08/21).  This will be to review Nathanyal's clinical records. [facility] stated that I didn't need to be a part of Grahm's clinical assessment. So we should know by Friday, 01/09/21 if they are going to accept [facility] and place him on [facility] waiting list.  I know it's been a long tough road but we are making progress on Kris's behalf. Have a great day!  PS.  I will be working on [employer] 5 day Emergency Room Report and will email it to Team [place] by the end of the day tomorrow, Tuesday, 12/23/20.   Thanks! [name] [name], QP IDD Care Coordinator, [facility] [address] [place], [place]  [address] [phone] (office) Tuesday and Thursday (in the office) [phone] (cell)  Monday, Wednesday, and Friday (working from home) [phone] (fax)   CONFIDENTIALITY NOTICE: This message and any attachments included are from [facility] and are for sole use by the intended recipient(s). The information contained herein may include confidential or privileged information. Unauthorized review, forwarding, printing, copying, distributing, or using such information is strictly prohibited and may be unlawful. If you received this message in error, or have reason to believe you are not authorized to receive it, please contact the sender by reply email and destroy all copies of the original message. **Please be advised that any e-mail sent to and from this e-mail account is subject to the [facility] Public Records Law and may be disclosed to  third parties. *To report fraud, waste and abuse, call the Medicaid tip-line at1-877-DMA-TIP1 (825-535-9658). Your call will remain anonymous.  WARNING: This email originated outside of [facility]. Even if this looks like a [employer], it is not. Do not provide your username, password, or any other personal information in response to this or any other email. [facility] will never ask you for your username or password via email. DO NOT CLICK links or attachments unless you are positive the content is safe. If in doubt about the safety of this message, select the [employer] Report Phishing button, which forwards to IT Security.  This message was secured by [employer].    [name].Nachmen Mansel, [name], LCSWA [name]  Transitions of Care Clinical Social Worker I Direct Dial: [phone]  Fax: [phone] [name]

## 2020-12-23 NOTE — ED Notes (Signed)
Pt has been drowsy, sleeping this shift.  No aggression or agitation noted. Pt given AM medication. Pt cooperative with redirection.

## 2020-12-23 NOTE — Progress Notes (Signed)
CSW received e-mail from [facility] regarding placement.   This message was sent securely using Zix    *Caution - External email - see footer for warnings* Great morning Team [name]; I am sending forwarding the email I received from Marylou Mccoy with [facility] regarding Nameer's acceptance into [facility].  Have a great blessed day! Thanks! [name] [name], QP IDD Care Coordinator, [facility] [address] [place], [place]  [address] [phone] (office) Tuesday and Thursday (in the office) [phone] (cell)  Monday, Wednesday, and Friday (working from home) [phone] (fax)  From: Marylou Mccoy [email or web address]>  Sent: Tuesday, December 23, 2020 8:55 AM To: Elveria Royals, [name] [email or web address]> Subject: [facility]'s acceptance  This message was sent securely using Zix    Hi [name] - I'm so sorry to be delayed in getting back to you - hope your week is off to a good start!  Regarding your request below.Marland KitchenMarland [facility]The [facility] Central Review Committee discussed Mearl's case again on July 28th and determined that he now meets the basic eligibility criteria for admission to a Developmental Center.  Those criteria include 1.) [facility] requires ICF/IID level of care, and 2.) A thorough search of appropriate community resources has been completed, and none have been secured that can meet his needs.  The next step is for [facility] internal admissions committee to review Regino's clinical needs.  This meeting will occur on August 16th, at which time a final decision will be made.  Ms. Allene Dillon also reached out to me about the review, and I sent a response e-mail, with the same information above, to her.  I hope this information is helpful - please let me know if you have any questions.   Daiva Huge Admissions/Discharge Coordinator [facility] Department of Health and Human  Services   Office: [phone] Fax: [phone] [email or web address]   [address] [place], [place] [address]   Twitter  Facebook  YouTube  LinkedIn   From: [name], [name] [email or web address]> Sent: Friday, December 19, 2020 2:19 PM To: Marylou Mccoy @[email or web address]> Subject: [External] [facility]'s acceptance    CAUTION: External email. Do not click links or open attachments unless you verify. Send all suspicious email as an attachment to Report Spam.  This message was sent securely using Zix    Great afternoon [place]; Is it possible for you to send me an email informing me of the team decision regarding [facility]. Can you also include the date for Nicoli's clinical assessment.  I would really appreciate it.  Thank you for all your assistance and patience. Have a great afternoon and weekend. Thanks!     [name] [name], QP IDD Care Coordinator, [facility] [phone] (office) Tuesday and Thursday (in the office) [phone] (cell)  Monday, Wednesday, and Friday (working from home) [phone] (fax)   CONFIDENTIALITY NOTICE: This message and any attachments included are from [facility] and are for sole use by the intended recipient(s). The information contained herein may include confidential or privileged information. Unauthorized review, forwarding, printing, copying, distributing, or using such information is strictly prohibited and may be unlawful. If you received this message in error, or have reason to believe you are not authorized to receive it, please contact the sender by reply email and destroy all copies of the original message. * Please be advised that any e-mail sent to and from this e-mail account is subject to the [facility] Public Records Law and may be disclosed  to third parties. * To report fraud, waste and abuse, call the Medicaid tip-line at1-877-DMA-TIP1 (250 050 4708). Your call will remain anonymous.     This message was secured by [employer].    [name].Alegra Rost, [name], LCSWA [name]  Transitions of Care Clinical Social Worker I Direct Dial: [phone]  Fax: [phone] [name]
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
## 2020-12-24 NOTE — ED Notes (Signed)
Patient agitated.  Yelling and stomping.  Refusing vitals.  Unable to redirect.

## 2020-12-25 NOTE — ED Notes (Signed)
Pt has had multiple episodes of choking today on meals. Pt is continuously pushing food in his mouth and not chewing it . Pt has also been drooling more. MD aware

## 2020-12-25 NOTE — ED Provider Notes (Signed)
Emergency Medicine Observation Re-evaluation Note  Paul Hendricks is a 30 y.o. male, seen on rounds today.  Pt initially presented to the ED for complaints of No chief complaint on file. Currently, the patient is sleeping.  Physical Exam  BP (!) 123/105 (BP Location: Left Arm)   Pulse (!) 120   Temp 97.7 F (36.5 C) (Oral)   Resp 18   Ht 6\' 2"  (1.88 m)   Wt 91 kg   SpO2 100%   BMI 25.76 kg/m  Physical Exam General: sleeping Cardiac: slight tachycardia Lungs: no increased WOB Psych: calm  ED Course / MDM  EKG:   I have reviewed the labs performed to date as well as medications administered while in observation.  Recent changes in the last 24 hours include none.  Plan  Current plan is for awaiting group home placement.  Paul Hendricks is not under involuntary commitment.     [name], MD 12/25/20 [phone]

## 2020-12-26 NOTE — ED Provider Notes (Signed)
Emergency Medicine Observation Re-evaluation Note  Paul Hendricks is a 30 y.o. male, seen on rounds today.  Pt initially presented to the ED for complaints of No chief complaint on file. Currently, the patient is awaiting placement at developmental center.  Physical Exam  BP (!) 123/105 (BP Location: Left Arm)   Pulse (!) 120   Temp 97.7 F (36.5 C) (Oral)   Resp 18   Ht 6\' 2"  (1.88 m)   Wt 91 kg   SpO2 100%   BMI 25.76 kg/m  Physical Exam General: Awake, standing, appears comfortable Cardiac: Extremities well perfused Lungs: Breathing is even and unlabored Psych: No agitation, does not appear to be responding to internal stimuli  ED Course / MDM  EKG:   I have reviewed the labs performed to date as well as medications administered while in observation.  Recent changes in the last 24 hours include continues to await placement in developmental center.  No events during shift.  Plan  Current plan is for placement and developmental center.  Paul Hendricks is not under involuntary commitment.     Paul Pastures, MD 12/26/20 [phone]

## 2020-12-26 NOTE — ED Notes (Signed)
Pt alert this shift. Cooperative with care. Took medication this AM. No aggression noted.

## 2020-12-27 NOTE — ED Provider Notes (Signed)
Emergency Medicine Observation Re-evaluation Note  Chad Donoghue is a 30 y.o. male, seen on rounds today.  Pt initially presented to the ED for complaints of No chief complaint on file. Currently, the patient is sleeping.  Physical Exam  BP (!) 123/105 (BP Location: Left Arm)   Pulse (!) 120   Temp 97.7 F (36.5 C) (Oral)   Resp 18   Ht 6\' 2"  (1.88 m)   Wt 91 kg   SpO2 100%   BMI 25.76 kg/m  Physical Exam General: No distress Cardiac: Regular rate and rhythm-noted tachycardia on last vital sign seems spurious value Lungs: No increased work of breathing Psych: Energetic, labile when awake, awakens easily  ED Course / MDM  EKG:   I have reviewed the labs performed to date as well as medications administered while in observation.  Recent changes in the last 24 hours include none.  Plan  Current plan is for ongoing efforts for placement being conducted by social work to continue.  Sameer Teeple is not under involuntary commitment.     [name], MD 12/27/20 [phone]

## 2020-12-27 NOTE — ED Notes (Signed)
Pt alert this shift. Calm . Cooperative . Redirectable. No aggression or agitation noted. Pt had medication this AM.  Pt needs set up and prompted for ADLs.
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
## 2020-12-28 NOTE — ED Provider Notes (Signed)
  Physical Exam  BP (!) 123/105 (BP Location: Left Arm)   Pulse (!) 111   Temp 97.7 F (36.5 C) (Oral)   Resp 18   Ht 6\' 2"  (1.88 m)   Wt 91 kg   SpO2 98%   BMI 25.76 kg/m   Physical Exam  ED Course/Procedures     Procedures  MDM  Patient reportedly had become more agitated.  Scratched .  Nursing requested to be able to cut patient's fingernails.  In the 2 months that he has been here he would probably benefit from some nail care.       Emergency planning/management officer, MD 12/28/20 1626
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
## 2020-12-28 NOTE — ED Provider Notes (Signed)
Emergency Medicine Observation Re-evaluation Note  Paul Hendricks is a 30 y.o. male, seen on rounds today.  Pt initially presented to the ED for complaints of No chief complaint on file. Currently, the patient is sleeping, awakens easily.  Physical Exam  BP (!) 123/105 (BP Location: Left Arm)   Pulse (!) 111   Temp 97.7 F (36.5 C) (Oral)   Resp 18   Ht 6\' 2"  (1.88 m)   Wt 91 kg   SpO2 98%   BMI 25.76 kg/m  Physical Exam General: No distress, currently sleeping, but when awake is energetic Cardiac: Regular rate and rhythm currently, but was tachycardic yesterday. Lungs: No increased work of breathing, no hypoxia Psych: Manic like behavior when awake  ED Course / MDM  EKG:   I have reviewed the labs performed to date as well as medications administered while in observation.  Recent changes in the last 24 hours include none.  Plan  Current plan is for placement.  Placement has been challenging, social work is .  Paul Hendricks is not under involuntary commitment.     Paul Pastures, MD 12/28/20 [phone]

## 2020-12-29 NOTE — ED Provider Notes (Signed)
Emergency Medicine Observation Re-evaluation Note  Paul Hendricks is a 30 y.o. male, seen on rounds today.  Pt initially presented to the ED for complaints of No chief complaint on file. Currently, the patient is asleep.  Physical Exam  BP 137/82 (BP Location: Left Arm)   Pulse 93   Temp 98.3 F (36.8 C) (Axillary)   Resp 18   Ht 6\' 2"  (1.88 m)   Wt 91 kg   SpO2 99%   BMI 25.76 kg/m  Physical Exam General: nad Lungs: no distress Psych: calm  ED Course / MDM  EKG:   I have reviewed the labs performed to date as well as medications administered while in observation.  Recent changes in the last 24 hours include agitation, had to have PRN meds.  Plan  Current plan is for [facility] review 01/06/21.  Paul Hendricks is not under involuntary commitment.     Paul Pastures, MD 12/29/20 [phone]

## 2020-12-29 NOTE — ED Notes (Signed)
Pt alert this shift. Pt able to eat. Pt took medication this AM. Pt ambulatory. Pt needs prompting and redirection, cooperative this shift. No aggression or agitation noted.

## 2020-12-29 NOTE — ED Notes (Signed)
Pt eat 100% of his lunch.   

## 2020-12-29 NOTE — ED Notes (Signed)
Pt eat all his breakfast.

## 2020-12-30 NOTE — Progress Notes (Signed)
CSW received e-mail from [facility] regarding placement.  This message was sent securely using [employer]    *Caution - External email - see footer for warnings* Great afternoon Team [place]; Attached is a copy of [facility] 5-Day ER Report with Copy of Provider List and Contact.   I received a telephone call from Marylou Mccoy with [facility] informing me that Jakin's clinical assessment meeting will be on Tuesday, 01/04/21.  The physician reviewing Demarco's file would like to have a telephone conference with Team [name] for him to have a better picture of Jihaad before next [employer]. Lanora Manis stated she would like to have Care Coordinator, Bruno guardian and nature supports, someone from [name]'s ER staff and social worker, previous providers and direct care staff, and [employer].  The physician is available on Friday, 01/02/21 from 12pm to 3pm.  Can each member of Team Patmos check their schedules to see you are available to be on the telephone conference meeting for [facility] then email me back asap to let me know what time you are available to meet.   I am going to reach out to [name] and Beverley's previous direct care staff member to see if they will be available to attend the telephone conference.   By the way, [name] with [name] left me a voicemail message yesterday stating she received Bethel's Medicaid card and wants to know who she needs to give it to.   Have a great day! Thanks! [name] [name], QP IDD Care Coordinator, [facility] LME-M[facility] [address] [place], [place]  [address] [phone] (office) Tuesday and Thursday (in the office) [phone] (cell)  Monday, Wednesday, and Friday (working from home) [phone] (fax)    CONFIDENTIALITY NOTICE: This message and any attachments included are from [facility] and are for sole use by the intended recipient(s).  The information contained herein may include confidential or privileged information. Unauthorized review, forwarding, printing, copying, distributing, or using such information is strictly prohibited and may be unlawful. If you received this message in error, or have reason to believe you are not authorized to receive it, please contact the sender by reply email and destroy all copies of the original message. ** Please be advised that any e-mail sent to and from this e-mail account is subject to the [facility] Public Records Law and may be disclosed to third parties. ** To report fraud, waste and abuse, call the Medicaid tip-line at1-877-DMA-TIP1 (254-324-3412). Your call will remain anonymous.  WARNING: This email originated outside of [facility]. Even if this looks like a [employer], it is not. Do not provide your username, password, or any other personal information in response to this or any other email. [facility] [place] will never ask you for your username or password via email. DO NOT CLICK links or attachments unless you are positive the content is safe. If in doubt about the safety of this message, select the [employer] Phishing button, which forwards to IT Security.    This message was secured by [employer].

## 2020-12-30 NOTE — ED Notes (Signed)
Patient jumped of screaming and exited unit heading to main hallway.  Staff successfully redirected back to room.

## 2020-12-30 NOTE — ED Notes (Signed)
Patient sleeping at this time.

## 2020-12-30 NOTE — ED Notes (Signed)
Patient refused vitals.

## 2020-12-31 NOTE — ED Notes (Signed)
Pt alert at times this shift. Pt drowsy. Pt resting comfortably. Pt ate meals this shift.  Pt took medication.  No aggression or agitation noted.

## 2020-12-31 NOTE — ED Notes (Signed)
Patient refused vitals.

## 2020-12-31 NOTE — ED Provider Notes (Signed)
Emergency Medicine Observation Re-evaluation Note  Paul Hendricks is a 30 y.o. male, seen on rounds today.  Pt initially presented to the ED after being sent in from group home for aggression and seizures. Hx of autism, non verbal at baseline. Currently patient is in ED awaiting placement. Patient is currently resting comfortably in bed, calm.   Physical Exam  BP 137/82 (BP Location: Left Arm)   Pulse 93   Temp 98.3 F (36.8 C) (Axillary)   Resp 18   Ht 6\' 2"  (1.88 m)   Wt 91 kg   SpO2 99%   BMI 25.76 kg/m  Physical Exam Vitals and nursing note reviewed.  HENT:     Head: Normocephalic and atraumatic.  Cardiovascular:     Rate and Rhythm: Normal rate.  Pulmonary:     Effort: Pulmonary effort is normal. No respiratory distress.  Neurological:     Mental Status: He is alert.     Comments: Nonverbal   Psychiatric:     Comments: Calm Resting in bed comfortably      ED Course / MDM  EKG:   I have reviewed the labs performed to date as well as medications administered while in observation.  Recent changes in the last 24 hours include no new changes..  Plan  Current plan is for [facility] review 01/06/2021.   Desmin Daleo is not under involuntary commitment.     [name], DO 12/31/20 1217

## 2021-01-01 NOTE — ED Provider Notes (Signed)
Emergency Medicine Observation Re-evaluation Note  Paul Hendricks is a 30 y.o. male, seen on rounds today.  Pt initially presented to the ED for complaints of No chief complaint on file. Currently, the patient is sleeping.  Physical Exam  BP (!) 126/94 (BP Location: Left Arm)   Pulse (!) 103   Temp 97.7 F (36.5 C) (Oral)   Resp 18   Ht 6\' 2"  (1.88 m)   Wt 91 kg   SpO2 94%   BMI 25.76 kg/m  Physical Exam General: nad Lungs: no distress Psych: cooperative  ED Course / MDM  EKG:   I have reviewed the labs performed to date as well as medications administered while in observation.  Recent changes in the last 24 hours include none.  Plan  Current plan is for [facility] review 01/06/21.  Paul Hendricks is not under involuntary commitment.     [name], MD 01/01/21 [phone]

## 2021-01-01 NOTE — ED Notes (Signed)
Pt is sleeping, will give meal tray once he wakes up

## 2021-01-01 NOTE — ED Notes (Signed)
Pt is sleeping

## 2021-01-01 NOTE — ED Notes (Signed)
Breakfast tray given.  Called kitchen for brownie to place crushed pills in but they are out and will bring cheesecake

## 2021-01-01 NOTE — ED Notes (Signed)
Pt is eating his dinner

## 2021-01-02 NOTE — ED Notes (Signed)
Pt alert this shift. Pt cooperative with redirection. Pt had episode of being upset. Redirected and reassured.  Pt ambulatory .  Pt can complete ADLs with prompting.

## 2021-01-03 NOTE — ED Provider Notes (Signed)
Emergency Medicine Observation Re-evaluation Note  Hamad Whyte is a 30 y.o. male, seen on rounds today.  Pt initially presented to the ED for complaints of No chief complaint on file. Currently, the patient is asleep.  Physical Exam  BP (!) 126/94 (BP Location: Left Arm)   Pulse (!) 103   Temp 97.7 F (36.5 C) (Oral)   Resp 18   Ht 6\' 2"  (1.88 m)   Wt 91 kg   SpO2 94%   BMI 25.76 kg/m  Physical Exam General: nad Lungs: no distress Psych: calm, cooperative  ED Course / MDM  EKG:   I have reviewed the labs performed to date as well as medications administered while in observation.  Recent changes in the last 24 hours include none.  Plan  Current plan is for [facility] Review 01/06/21.  Zael Shuman is not under involuntary commitment.     [name], MD 01/03/21 [phone]

## 2021-01-03 NOTE — ED Notes (Signed)
Pt eating dinner at bedside.

## 2021-01-04 NOTE — ED Notes (Signed)
Breakfast tray provided. 

## 2021-01-05 ENCOUNTER — Emergency Department (HOSPITAL_COMMUNITY): Payer: Medicaid Other

## 2021-01-05 LAB — COMPREHENSIVE METABOLIC PANEL
ALT: 89 U/L — ABNORMAL HIGH (ref 0–44)
AST: 91 U/L — ABNORMAL HIGH (ref 15–41)
Albumin: 3.8 g/dL (ref 3.5–5.0)
Alkaline Phosphatase: 69 U/L (ref 38–126)
Anion gap: 10 (ref 5–15)
BUN: 12 mg/dL (ref 6–20)
CO2: 22 mmol/L (ref 22–32)
Calcium: 9.4 mg/dL (ref 8.9–10.3)
Chloride: 108 mmol/L (ref 98–111)
Creatinine, Ser: 0.88 mg/dL (ref 0.61–1.24)
GFR, Estimated: >60 mL/min (ref >60)
Glucose, Bld: 154 mg/dL — ABNORMAL HIGH (ref 70–99)
Potassium: 3.5 mmol/L (ref 3.5–5.1)
Sodium: 140 mmol/L (ref 135–145)
Total Bilirubin: 0.6 mg/dL (ref 0.3–1.2)
Total Protein: 7.1 g/dL (ref 6.5–8.1)

## 2021-01-05 LAB — CBC WITH DIFFERENTIAL/PLATELET
Abs Immature Granulocytes: 0.06 10*3/uL (ref 0.00–0.07)
Basophils Absolute: 0.1 10*3/uL (ref 0.0–0.1)
Basophils Relative: 1 %
Eosinophils Absolute: 0.9 10*3/uL — ABNORMAL HIGH (ref 0.0–0.5)
Eosinophils Relative: 10 %
HCT: 40.1 % (ref 39.0–52.0)
Hemoglobin: 12.4 g/dL — ABNORMAL LOW (ref 13.0–17.0)
Immature Granulocytes: 1 %
Lymphocytes Relative: 41 %
Lymphs Abs: 4 10*3/uL (ref 0.7–4.0)
MCH: 30.5 pg (ref 26.0–34.0)
MCHC: 30.9 g/dL (ref 30.0–36.0)
MCV: 98.5 fL (ref 80.0–100.0)
Monocytes Absolute: 0.7 10*3/uL (ref 0.1–1.0)
Monocytes Relative: 8 %
Neutro Abs: 3.7 10*3/uL (ref 1.7–7.7)
Neutrophils Relative %: 39 %
Platelets: 151 10*3/uL (ref 150–400)
RBC: 4.07 MIL/uL — ABNORMAL LOW (ref 4.22–5.81)
RDW: 12.7 % (ref 11.5–15.5)
WBC: 9.5 10*3/uL (ref 4.0–10.5)
nRBC: 0 % (ref 0.0–0.2)

## 2021-01-05 LAB — URINALYSIS, ROUTINE W REFLEX MICROSCOPIC
Bilirubin Urine: NEGATIVE
Glucose, UA: NEGATIVE mg/dL
Hgb urine dipstick: NEGATIVE
Ketones, ur: 5 mg/dL — AB
Leukocytes,Ua: NEGATIVE
Nitrite: NEGATIVE
Protein, ur: NEGATIVE mg/dL
Specific Gravity, Urine: 1.025 (ref 1.005–1.030)
pH: 5 (ref 5.0–8.0)

## 2021-01-05 LAB — VALPROIC ACID LEVEL: Valproic Acid Lvl: 67 ug/mL (ref 50.0–100.0)

## 2021-01-05 MED ORDER — LORAZEPAM 2 MG/ML IJ SOLN
2.0000 mg | Freq: Once | INTRAMUSCULAR | Status: AC
  Administered 2021-01-05: 2 mg via INTRAMUSCULAR
  Filled 2021-01-05: qty 1

## 2021-01-05 NOTE — ED Provider Notes (Addendum)
Called to see patient who had fallen to the ground and had a seizure.  Patient had been pacing in the hallway just prior to event.  Staff report that he stopped, leaned up against the wall and then fell backwards to the ground, had a generalized tonic-clonic seizure.  He does have a history of seizures (depakote, topamax).  Patient slightly postictal after the event but no evidence of any significant trauma.  Will administer Ativan for further seizure prevention. CT head, labs  Addendum 04:00AM - CBC, CMET, UA unremarkableValproic Acid level appropriate at 67. Topamax level CT head still pending  Addendum 06:40AM -patient continues to do well.  Unable to obtain CT because he is not cooperative.  At his normal baseline, however.  Very little suspicion for head injury.  Will continue to monitor clinically.    Gilda Crease, MD 01/05/21 5784    Gilda Crease, MD 01/05/21 0408    Gilda Crease, MD 01/05/21 [phone]

## 2021-01-05 NOTE — ED Notes (Signed)
Pt has been alert this shift., mostly sleeping this shift.  Pt took medication this AM. No aggression or agitation noted.

## 2021-01-05 NOTE — ED Notes (Signed)
Pt sitting moaning outside room, pacing back and forth.  Pt leaned forward and fell back. Pt experienced what seemed to be a seizure, lasting about 2 minutes.

## 2021-01-05 NOTE — Progress Notes (Signed)
CSW received an e-mail from [name] worthy,  This message was sent securely using Zix    *Caution - External email - see footer for warnings* Great afternoon Team [name]; I am emailing you to let you know that Caesar's clinical staff telephone conference meeting went well on Friday, 01/02/21.  Those participating in the telephone conference were Dr. Dan Humphreys, physician from [facility], the social workers and staff caring for [employer].  Participating in this meeting and hearing what has been going on with [name] since his stay at [facility] was heartbreaking but was very much needed to give a REAL picture of Abem's severe behaviors and most importantly confirming that staying at the hospital ER is not helping but increasing Bradyn's behaviors.   I should receive a call from Marylou Mccoy of [facility] decision on whether Cayden will be approved to be placed on [facility] waiting list for services. The question was asked since Tien's behaviors are so severe and he is currently "homeless" and has no provider willing to agree to provide Busby with Residential Level 4 services, even with offering 2 to 1 staffing.  It was stated that Marylou Mccoy would address any of my questions once the decision is made.  I am forwarding this email I received from Chick's mother/guardian to allow Team Simmie to know Phinneas situation is not only affecting [name] but [employer].  I pray that Mrs. Torres doesn't mind me sharing this with Team [name] but I feel it's so important for Team Jabari to understand how Arham's mother/guardian is feeling and the effect that Tina's situation is effecting her.   As a member of [employer], I am willing to follow the path, whatever it takes to finding a solution to Nicklas's situation.  I will send Team Brandi a copy of Montel's 5 day ER report tomorrow for an update on my efforts for locating a place for [place]. I welcome  any suggestions or resources Team Morley may have to assist with locating a provider for [place]. Thanks for all of Team Rosendo's support, especially to the [employer] and social workers. I am sorry for the length of this email, but it is my desire to allow Team [name] to know where things stand with [name] and my efforts of locating services to meet the needs of Mervin so Milind can have the best care of life possible.  Have a great day!   Thanks! [name] [name], QP IDD Care Coordinator, [facility] LME[facility] [address] [place], [place]  [address] [phone] (office) Tuesday and Thursday (in the office) [phone] (cell)  Monday, Wednesday, and Friday (working from home) [phone] (fax)   [name].Kanylah Muench, [name], LCSWA [name]  Transitions of Care Clinical Social Worker I Direct Dial: [phone]  Fax: [phone] [name]

## 2021-01-05 NOTE — ED Notes (Signed)
Pt woke up, ate and went out for a walk without incident. Pt went outside and walked the halls of hospital with security escort. Pt complied with each request given by staff and security. Pt returned and wanted to sit in chair outside of room.

## 2021-01-06 LAB — TOPIRAMATE LEVEL: Topiramate Lvl: <1.5 ug/mL — ABNORMAL LOW (ref 2.0–25.0)

## 2021-01-06 NOTE — ED Notes (Signed)
PT was given his dinner tray at this time since Pt woke up. Pt ate all of his food that was in the dinner tray. Pt is now laying back down in bed resting.

## 2021-01-08 NOTE — ED Notes (Signed)
Pt agitated. Pt is acting outside of his baseline, and becoming more agitated.

## 2021-01-08 NOTE — ED Provider Notes (Signed)
  Physical Exam  BP (!) 145/98 (BP Location: Left Arm)   Pulse (!) 128   Temp 98 F (36.7 C) (Axillary)   Resp 20   Ht 6\' 2"  (1.88 m)   Wt 91 kg   SpO2 98%   BMI 25.76 kg/m   Physical Exam  ED Course/Procedures     Procedures  MDM  Patient has been in the ER for 77 days.  Still pending placement.  Reviewing records appears to have 2 days ago he did have a seizure.  No further seizure activity.  Back at baseline.  Initially they had attempted to get a head CT but patient's behavior will not allow it.  At baseline doubt there is any intracranial injury.  Still attempting to find placement.       , MD 01/08/21 [phone]

## 2021-01-08 NOTE — Progress Notes (Signed)
CSW received e-mail in regards to placement from [facility].  Great evening Team [name]; Sorry for this long email.  First of all, I have great news, I just received Maxwel's acceptance letter to be placed on [facility] waiting list. Even though we count this as a HUGE victory for [place] and we deserve to celebrate this long, hard battle but the battle is not over.  We still must locate a provider willing to provide Residential Level 4 [name] placement for [facility].  As we are all aware of this hasn't been or is no easy task but by no means impossible.      I am forwarding Team [name] the letter of acceptance for Kinsler to be placed on [place] for impatient treatment. If any member of Team Navraj has any suggestions or leads for me to contact on Girolamo's behalf, please let me know.  Next week I will focus on contacting other LME/MCO's in [place] to see if they have an open placement in their area to provide the care and services Kylle so desperately needs and deserves. I appreciate all the support Team Thelbert has provided. Let us not lose faith but stay positive. Will be in touch with updates on Tuesday.  Have a great evening and weekend!  Thanks!           [name] [name], QP IDD Care Coordinator, [facility] LME-M[facility] [address] [place], [place]  [address] [phone] (office) Tuesday and Thursday (in the office) [phone] (cell)  Monday, Wednesday, and Friday (working from home) [phone] (fax)    [name].Kyran Connaughton, [name], LCSWA [name]  Transitions of Care Clinical Social Worker I Direct Dial: [phone]  Fax: [phone] [name]

## 2021-01-09 NOTE — ED Notes (Signed)
Pt ate breakfast. Pt resting comfortably. No s/s of distress. No aggression or agitation noted.

## 2021-01-09 NOTE — ED Notes (Signed)
Pt sleeping comfortably.  No s/s of distress.

## 2021-01-09 NOTE — ED Provider Notes (Signed)
Emergency Medicine Observation Re-evaluation Note  Pawel Soules is a 30 y.o. male, seen on rounds today.  Pt initially presented to the ED for complaints of No chief complaint on file. Currently, the patient is sleeping.  Physical Exam  BP (!) 145/98 (BP Location: Left Arm)   Pulse (!) 122   Temp 98 F (36.7 C) (Axillary)   Resp 20   Ht 6\' 2"  (1.88 m)   Wt 91 kg   SpO2 98%   BMI 25.76 kg/m  Physical Exam General: Resting Comfortably Lungs: Resp even and unlabored Psych: Sleeping, calm ED Course / MDM  EKG:EKG Interpretation  Date/Time:  Monday January 05 2021 01:56:14 EDT Ventricular Rate:  124 PR Interval:  138 QRS Duration: 74 QT Interval:  302 QTC Calculation: 433 R Axis:   88 Text Interpretation: Sinus tachycardia Otherwise normal ECG Confirmed by 11-30-1999 [phone]) on 01/05/2021 4:06:29 AM  I have reviewed the labs performed to date as well as medications administered while in observation.  Recent changes in the last 24 hours include none.  Plan  Current plan is for placement.  Bruk Tumolo is not under involuntary commitment.     [name], MD 01/09/21 [phone]

## 2021-01-09 NOTE — ED Notes (Signed)
Pt resting comfortably this shift.  Pt ate breakfast and lunch.  Pt took medication this AM. No aggression or agitation noted.

## 2021-01-10 NOTE — ED Notes (Signed)
The pt became increasingly agitated, was pacing round the nursing unit, hitting his head, and biting his fingers, no signes of decease agitation after redirection, 2 mg of I.V. Ativan was given. I will continue to monitor.

## 2021-01-11 NOTE — ED Notes (Signed)
Pt has slept most of the day. When awake, he has been cooperative and calm.

## 2021-01-11 NOTE — ED Provider Notes (Signed)
Emergency Medicine Observation Re-evaluation Note  Paul Hendricks is a 30 y.o. male, seen on rounds today.  Pt initially presented to the ED for complaints of No chief complaint on file. Currently, the patient is resting comfortably.  Physical Exam  BP (!) 145/98 (BP Location: Left Arm)   Pulse (!) 122   Temp 98 F (36.7 C) (Axillary)   Resp 20   Ht 6\' 2"  (1.88 m)   Wt 91 kg   SpO2 98%   BMI 25.76 kg/m  Physical Exam General: resting Lungs: Even and unlabored Psych: sleeping, calm, NAD  ED Course / MDM  EKG:EKG Interpretation  Date/Time:  Monday January 05 2021 01:56:14 EDT Ventricular Rate:  124 PR Interval:  138 QRS Duration: 74 QT Interval:  302 QTC Calculation: 433 R Axis:   88 Text Interpretation: Sinus tachycardia Otherwise normal ECG Confirmed by 11-30[birth year]1999 [phone]) on 01/05/2021 4:06:29 AM  I have reviewed the labs performed to date as well as medications administered while in observation.  Recent changes in the last 24 hours include none.  Plan  Current plan is for placement.  Paul Hendricks is not under involuntary commitment.     Paul Pastures, MD 01/11/21 [phone]

## 2021-01-12 NOTE — ED Provider Notes (Addendum)
Emergency Medicine Observation Re-evaluation Note  Paul Hendricks is a 30 y.o. male, seen on rounds today.  Pt initially presented to the ED for complaints of No chief complaint on file. Currently, the patient is calm, resting.  Physical Exam  BP (!) 145/98 (BP Location: Left Arm)   Pulse (!) 122   Temp 98 F (36.7 C) (Axillary)   Resp 20   Ht 6\' 2"  (1.88 m)   Wt 91 kg   SpO2 98%   BMI 25.76 kg/m  Physical Exam General: no acute distress, calm, non-toxic appearing Lungs: even and unlabored, no respiratory distress Psych: calm, cooperative, NAD  ED Course / MDM  EKG:EKG Interpretation  Date/Time:  Monday January 05 2021 01:56:14 EDT Ventricular Rate:  124 PR Interval:  138 QRS Duration: 74 QT Interval:  302 QTC Calculation: 433 R Axis:   88 Text Interpretation: Sinus tachycardia Otherwise normal ECG Confirmed by 11-30-1999 [phone]) on 01/05/2021 4:06:29 AM  I have reviewed the labs performed to date as well as medications administered while in observation.  Recent changes in the last 24 hours include none.  Plan  Current plan is for placement .  Paul Hendricks is not under involuntary commitment.     [name], DO 01/12/21 1240    01/14/21, DO 01/12/21 1240

## 2021-01-12 NOTE — ED Notes (Signed)
Pt is asleep at this time.

## 2021-01-13 NOTE — Progress Notes (Signed)
CSW spoke with [facility] with [facility] , in regards to placement. She reported she is in the process of contacting different LMEs to see if they have any placement options for pt. She reported contacting [facility] and is waiting on a response. She reporters that she will get in contact with [facility], [facility], [facility] and [facility] by the middle of next week.    [name].Aviyah Swetz, [name], LCSWA [name]  Transitions of Care Clinical Social Worker I Direct Dial: [phone]  Fax: [phone] [name]

## 2021-01-13 NOTE — ED Provider Notes (Signed)
Emergency Medicine Observation Re-evaluation Note  Paul Hendricks is a 30 y.o. male, seen on rounds today.  Pt initially presented to the ED for complaints of No chief complaint on file. Currently, the patient is resting.  Physical Exam  BP (!) 145/98 (BP Location: Left Arm)   Pulse (!) 122   Temp 98 F (36.7 C) (Axillary)   Resp 20   Ht 6\' 2"  (1.88 m)   Wt 91 kg   SpO2 98%   BMI 25.76 kg/m  Physical Exam General: Resting without complaints Cardiac: No murmur on my exam Lungs: Clear Psych: No complaints  ED Course / MDM  EKG:EKG Interpretation  Date/Time:  Monday January 05 2021 01:56:14 EDT Ventricular Rate:  124 PR Interval:  138 QRS Duration: 74 QT Interval:  302 QTC Calculation: 433 R Axis:   88 Text Interpretation: Sinus tachycardia Otherwise normal ECG Confirmed by 11-30-1999 [phone]) on 01/05/2021 4:06:29 AM  I have reviewed the labs performed to date as well as medications administered while in observation.  Recent changes in the last 24 hours include none.  Plan  Current plan is for placement.  Paul Hendricks is not under involuntary commitment.     Paul Hendricks, Paul Pastures, MD 01/13/21 1110

## 2021-01-13 NOTE — ED Notes (Signed)
Pt has been alert this shift.  Resting mostly during shift. Pt cooperative with direction. No aggression.  Pt took AM medication.

## 2021-01-13 NOTE — Discharge Planning (Signed)
Licensed Clinical Social Worker is seeking post-discharge placement for this patient at the following level of care: group home.     

## 2021-01-14 NOTE — ED Provider Notes (Signed)
Emergency Medicine Observation Re-evaluation Note  Paul Hendricks is a 30 y.o. male, seen on rounds today.  Pt initially presented to the ED for complaints of No chief complaint on file. Currently, the patient is resting.  Physical Exam  BP (!) 145/98 (BP Location: Left Arm)   Pulse (!) 122   Temp 98 F (36.7 C) (Axillary)   Resp 20   Ht 6\' 2"  (1.88 m)   Wt 91 kg   SpO2 98%   BMI 25.76 kg/m  Physical Exam General: Asleep, resting comfortably Lungs: Respirations even and unlabored Psych: No distress, resting  ED Course / MDM  EKG:EKG Interpretation  Date/Time:  Monday January 05 2021 01:56:14 EDT Ventricular Rate:  124 PR Interval:  138 QRS Duration: 74 QT Interval:  302 QTC Calculation: 433 R Axis:   88 Text Interpretation: Sinus tachycardia Otherwise normal ECG Confirmed by 11-30-1999 [phone]) on 01/05/2021 4:06:29 AM  I have reviewed the labs performed to date as well as medications administered while in observation.  Recent changes in the last 24 hours include none.  Plan  Current plan is for placement.  Paul Hendricks is not under involuntary commitment.   LCSW is currently seeking postdischarge placement to a group home.     Paul Pastures, MD 01/14/21 [phone]

## 2021-01-15 NOTE — ED Provider Notes (Signed)
Emergency Medicine Observation Re-evaluation Note  Paul Hendricks is a 30 y.o. male, seen on rounds today.  Pt initially presented to the ED for complaints of No chief complaint on file. Currently, the patient is resting.  Physical Exam  BP (!) 145/98 (BP Location: Left Arm)   Pulse (!) 122   Temp 98 F (36.7 C) (Axillary)   Resp 20   Ht 6\' 2"  (1.88 m)   Wt 91 kg   SpO2 98%   BMI 25.76 kg/m  Physical Exam General: resting comfortably, NAD Lungs: normal WOB Psych: currently calm and resting  ED Course / MDM  EKG:EKG Interpretation  Date/Time:  Monday January 05 2021 01:56:14 EDT Ventricular Rate:  124 PR Interval:  138 QRS Duration: 74 QT Interval:  302 QTC Calculation: 433 R Axis:   88 Text Interpretation: Sinus tachycardia Otherwise normal ECG Confirmed by 11-30-1999 [phone]) on 01/05/2021 4:06:29 AM  I have reviewed the labs performed to date as well as medications administered while in observation.  Recent changes in the last 24 hours include none reported.  Plan  Current plan is for post discharge placement in group home.  Paul Hendricks is not under involuntary commitment.     [name], DO 01/15/21 1007

## 2021-01-15 NOTE — Progress Notes (Signed)
CSW attempted to contact [facility], no response left VM.      [name].Kaileigh Viswanathan, [name], LCSWA [name]  Transitions of Care Clinical Social Worker I Direct Dial: [phone]  Fax: [phone] [name]

## 2021-01-15 NOTE — ED Notes (Signed)
Pt alert this shift. Cooperative with redirection. No aggression or agitation noted. Pt took medication.  Pt ambulatory. Pt ate all meals. Pt needs assist with ADLs.

## 2021-01-15 NOTE — ED Notes (Signed)
Took pt for a 15 minute walk through the garden with security escort.

## 2021-01-15 NOTE — ED Notes (Signed)
Patient getting agitated due to another patient being rowdy.

## 2021-01-16 NOTE — ED Notes (Signed)
Pt alert this shift. No s/s of distress. Pt took medication this AM.  No aggression of agitation noted.  Pt ate all meals.

## 2021-01-16 NOTE — ED Notes (Signed)
Pt refused VS this shift  

## 2021-01-16 NOTE — Progress Notes (Signed)
CSW received Emails from [facility] for [facility] in regards to placement.   From: Kristopher Oppenheim [email or web address]>  Sent: Thursday, January 15, 2021 4:18 PM To: [email or web address]; [name], [name] [email or web address]> Cc: [email or web address]; Torres, Myrta E [email or web address]>; [email or web address]; Gainey, Aloysius [email or web address]>; [email or web address]; Dana Allan [email or web address]>; Lana Fish @[email or web address]> Subject: RE: Info on GM for Residential Level 4 with severe behaviors  CAUTION: This email originated from a Non-[place] [employer] address. Do not click links or open attachments unless you recognize the sender and know the content is safe.  [name],  This is wonderful news!  I hope they are able to follow through!  Patty  From: [email or web address]>  Sent: Thursday, January 15, 2021 3:29 PM To: [name], [name] [email or web address]> Cc: [email or web address]; [email or web address]; [email or web address]; Kristopher [email or web address] [email or web address]>; Ashok Cordia [email or web address]>; Lanora [email or web address]; Dana Allan [email or web address]>; Lana Fish @[email or web address]> Subject: RE: Info on GM for Residential Level 4 with severe behaviors  This message was sent securely using Zix    Thank you so much. We are willing to accept him.  We are reviewing his information now and reaching out to our [name] providers for placement. How soon does he need placement? IS he currently in the hospital? --- Originally sent by [email or web address] on Jan 15, 2021 3:00 PM ---  This message was sent securely using Zix       Great afternoon Mr. Paul Hendricks; Thank you so much for talking with me regarding you having a Residential Level 79 for  a 30 year old male with severe behaviors and your willingness to consider a Residential Level 4 placement for [facility].  I have attached a copy of [facility]'s annual isp, behavior plan and a target behaviors.  Please give me a call or email me if you have any questions or concerns. I await your decision whether you will accept [facility].  Have a great day!   [name] [name], QP  IDD Care Coordinator, [facility] L[facility]  [address] [place], [place]  [address]  [phone] (office) Tuesday and Thursday (in the office)  [phone] (cell)  Monday, Wednesday, and Friday (working from home)  [phone] (fax)    CONFIDENTIALITY NOTICE: This message and any attachments included are from [facility] and are for sole use by the intended recipient(s). The information contained herein may include confidential or privileged information. Unauthorized review, forwarding, printing, copying, distributing, or using such information is strictly prohibited and may be unlawful. If you received this message in error, or have reason to believe you are not authorized to receive it, please contact the sender by reply email and destroy all copies of the original message. * Please be advised that any e-mail sent to and from this e-mail account is subject to the [facility] Public Records Law and may be disclosed to third parties. *To report fraud, waste and abuse, call the Medicaid tip-line at1-877-DMA-TIP1 (512-855-4141). Your call will remain anonymous.   This message was secured by [employer]  .     This message was secured by Zix.  CONFIDENTIALITY NOTICE: This message and any attachments included are from [facility] and are for sole use by the intended recipient(s). The information contained herein may include confidential or privileged information. Unauthorized review, forwarding, printing, copying, distributing, or using such information is strictly prohibited and may be  unlawful. If you received this message in error, or have reason to believe you are not authorized to receive it, please contact the sender by reply email and destroy all copies  of the original message. *Please be advised that any e-mail sent to and from this e-mail account is subject to the [facility] Public Records Law and may be disclosed to third parties. * To report fraud, waste and abuse, call the Medicaid tip-line at1-877-DMA-TIP1 (539-065-8254). Your call will remain anonymous.  This e-mail is for the sole use of the individual for whom it is intended. If you are neither the intended recipient, nor agent responsible for delivering this e-mail to the intended recipient, any disclosure, retransmission, copying, or taking action in reliance on this information is strictly prohibited. If you have received this e-mail in error, please notify the person transmitting the information immediately. All e-mail correspondence to and from this e-mail address may be subject to [facility] Public Records Law which result in monitoring and disclosure to third parties, including law enforcement. In compliance with federal laws, [employer] administers all educational programs, employment activities and admissions without discrimination because of race, religion, national or ethnic origin, color, age, [employer], disability or gender, except where exemption is appropriate and allowed by Social worker. Refer to the Board of Education's [facility] for a complete statement. Inquiries or complaints should be directed to the [facility] Compliance Officer, [address], [place], [place] [address]; [phone]  WARNING: This email originated outside of [facility]. Even if this looks like a [employer]Ex, it is not. Do not provide your username, password, or any other personal information in response to this or any other email. [facility] will never ask you for your username or  password via email. DO NOT CLICK links or attachments unless you are positive the content is safe. If in doubt about the safety of this message, select the [employer] Report Phishing button, which forwards to IT Security.

## 2021-01-16 NOTE — ED Provider Notes (Signed)
Emergency Medicine Observation Re-evaluation Note  Paul Hendricks is a 30 y.o. male, seen on rounds today.  Pt initially presented to the ED for complaints of No chief complaint on file. Currently, the patient is resting.  Physical Exam  BP (!) 145/98 (BP Location: Left Arm)   Pulse (!) 136   Temp 98 F (36.7 C) (Axillary)   Resp 18   Ht 6\' 2"  (1.88 m)   Wt 91 kg   SpO2 99%   BMI 25.76 kg/m  Physical Exam General: resting comfortably, NAD Lungs: normal WOB Psych: currently calm and resting  ED Course / MDM  EKG:EKG Interpretation  Date/Time:  Monday January 05 2021 01:56:14 EDT Ventricular Rate:  124 PR Interval:  138 QRS Duration: 74 QT Interval:  302 QTC Calculation: 433 R Axis:   88 Text Interpretation: Sinus tachycardia Otherwise normal ECG Confirmed by 11-30-1999 [phone]) on 01/05/2021 4:06:29 AM  I have reviewed the labs performed to date as well as medications administered while in observation.  Recent changes in the last 24 hours include none reported.  Plan  Current plan is for post discharge placement in group home.  Paul Hendricks is not under involuntary commitment.      [name], DO 01/16/21 [phone]

## 2021-01-16 NOTE — Progress Notes (Addendum)
[name] provider  Maurine Minister from [facility] visited pt today. [facility] director Jeannett Senior will be coming to see pt today as well.   [name].Eulas Schweitzer, [name], LCSWA [name]  Transitions of Care Clinical Social Worker I Direct Dial: [phone]  Fax: [phone] [name]

## 2021-01-17 NOTE — ED Notes (Signed)
Patient sitting in chair in hallway.

## 2021-01-17 NOTE — ED Notes (Signed)
Patient yelling and beating on glass.  Unable to redirect.

## 2021-01-17 NOTE — ED Notes (Signed)
Pt was taken on a walk outside with security escort.

## 2021-01-18 NOTE — ED Notes (Signed)
Pt alert this shift. Took medication this AM. Pt cooperative with redirection. No aggression or agitation noted.   No s/s of distress.

## 2021-01-19 NOTE — ED Notes (Signed)
Pt is calm and resting on bed.

## 2021-01-19 NOTE — Progress Notes (Signed)
CSW received e-mail in regards to placement.   This message was sent securely using Zix    *Caution - External email - see footer for warnings* Great afternoon Team [name]; I have fantastic news.  We have located a provider who is willing to provide Residential Level 4 [name] for [place] and the icing on the cake, the [name] is in [place].  I am beginning the process of submitting a client specific request for [place] to receive services from [facility].  I am going to do my best to have the client specific completed and turned in to [place] to review and submit it to [place] and the Board to review/approve.  Once Mehran's client specific has been approve, I will schedule the health and safety inspection for Rhylen's [name].  While waiting for the health and safety inspection, we will schedule Jameil's emergency isp update telephone conference to discuss [place]'s isp update for services with [facility].  Once [employer] health and safety inspection has been approved, I will have Team [name] to review and sign off on French's isp update for services then submit Chamar's isp update to [facility] for approval.  When Latavius's isp update has been approved [facility] will be able o move in.  I will keep Team Milon updated on the process when I find out everything.  Mr. Aundria Rud is also requesting 40 hours of [employer] until the day support program is in place for [place]. I will also include Visual merchandiser services on Deano's isp update. We are nearing the finish line so please be patient and know that Edwards's move WILL HAPPEN.  Have a great day! Thanks!   [name] [name], QP IDD Care Coordinator, [facility] LME-MCO [address] [place], [place]  [address] [phone] (office) Tuesday and Thursday (in the office) [phone] (cell)  Monday, Wednesday, and Friday (working from home) [phone] (fax)  From: Val Eagle  Sent: Friday,  January 16, 2021 12:16 PM To: Doylene Canning @[email or web address]>; Kristopher Oppenheim [email or web address]>; Dana Allan [email or web address]>; Ashok Cordia [email or web address]>; Lana Fish @[email or web address]> Cc: Allene Dillon, [place] E [email or web address]>; '[email or web address]>; [email or web address]; Sharol Roussel [email or web address]>; Honor Frison @[email or web address] .[email or web address]>; Tarpley-Carter, Ricquita @[email or web address]>; Toben, Stacey @ .com>; Shavis, Alesia @ .com>; Camillie Smith @[email or web address]>; Wanda Plump [email or web address]>; Denton Ar @[email or web address]> Subject: Schedule provider site visit with GM at the ER  [facility] afternoon Team [name]; I have great news, I have spoken with Mr. Jearl Klinefelter with [facility] this morning and they are willing to consider [name] for Residential Level 4 with the enhanced rate. [facility], Gevena Barre, [place], wants to make a visit to meet with [name] sometime today before 7pm.      Can you have one of the social workers give me a call to make this happen? I can not seem to find any of their telephone numbers.  I pray that this will be Pasha's next home.  Have a great day! Thanks!      [name] [name], QP IDD Care Coordinator, [facility] LME-MCO [address] [place], [place]  [address] [phone] (office) Tuesday and Thursday (in the office) [phone] (cell)  Monday, Wednesday, and Friday (working from home) [phone] (fax)  CONFIDENTIALITY NOTICE: This message and any attachments included are from [facility] and are for sole use by the intended recipient(s). The information contained herein may include confidential or privileged information. Unauthorized review, forwarding, printing, copying,  distributing, or using such information is strictly prohibited and may be unlawful. If you received this message in  error, or have reason to believe you are not authorized to receive it, please contact the sender by reply email and destroy all copies of the original message. * Please be advised that any e-mail sent to and from this e-mail account is subject to the [facility] Public Records Law and may be disclosed to third parties. * To report fraud, waste and abuse, call the Medicaid tip-line at1-877-DMA-TIP1 ((325) 666-8659). Your call will remain anonymous.  WARNING: This email originated outside of [facility]. Even if this looks like a FedEx, it is not. Do not provide your username, password, or any other personal information in response to this or any other email. [facility] will never ask you for your username or password via email. DO NOT CLICK links or attachments unless you are positive the content is safe. If in doubt about the safety of this message, select the Cofense Report Phishing button, which forwards to IT Security.     This message was secured by [employer].

## 2021-01-19 NOTE — ED Provider Notes (Signed)
Emergency Medicine Observation Re-evaluation Note  Paul Hendricks is a 30 y.o. male, seen on rounds today.  Currently, the patient is waiting for placement back into group home.  Physical Exam  BP (!) 131/101 (BP Location: Left Arm)   Pulse (!) 115   Temp 98 F (36.7 C) (Axillary)   Resp 18   Ht 1.88 m (6\' 2" )   Wt 91 kg   SpO2 100%   BMI 25.76 kg/m  Physical Exam General: Calm no acute distress Cardiac: Regular rhythm Lungs: Breathing easily   ED Course / MDM  EKG:EKG Interpretation  Date/Time:  Monday January 05 2021 01:56:14 EDT Ventricular Rate:  124 PR Interval:  138 QRS Duration: 74 QT Interval:  302 QTC Calculation: 433 R Axis:   88 Text Interpretation: Sinus tachycardia Otherwise normal ECG Confirmed by 11-30-1999 [phone]) on 01/05/2021 4:06:29 AM  I have reviewed the labs performed to date as well as medications administered while in observation.  Recent changes in the last 24 hours include no changes.  Plan  Current plan is for placement back in group home.  Paul Hendricks is not under involuntary commitment.     Paul Pastures, MD 01/19/21 [phone]

## 2021-01-20 NOTE — ED Notes (Signed)
Pt medication late due to patient being asleep.

## 2021-01-20 NOTE — ED Notes (Signed)
Patient beating on window in room

## 2021-01-21 NOTE — ED Provider Notes (Signed)
Emergency Medicine Observation Re-evaluation Note  Paul Hendricks is a 30 y.o. male, seen on rounds today.  Pt initially presented to the ED for complaints of No chief complaint on file. Currently, the patient is awaiting placement back to a group home.  Physical Exam  BP 110/80 (BP Location: Left Arm)   Pulse (!) 109   Temp 97.9 F (36.6 C) (Oral)   Resp 18   Ht 6\' 2"  (1.88 m)   Wt 91 kg   SpO2 100%   BMI 25.76 kg/m  Physical Exam General: Calm and cooperative Lungs: Breathing comfortably Psych: Resting  ED Course / MDM  EKG:EKG Interpretation  Date/Time:  Monday January 05 2021 01:56:14 EDT Ventricular Rate:  124 PR Interval:  138 QRS Duration: 74 QT Interval:  302 QTC Calculation: 433 R Axis:   88 Text Interpretation: Sinus tachycardia Otherwise normal ECG Confirmed by 11-30-1999 [phone]) on 01/05/2021 4:06:29 AM  I have reviewed the labs performed to date as well as medications administered while in observation.  Recent changes in the last 24 hours include none.  Plan  Current plan is for social work currently working on placement.  Paul Hendricks is not under involuntary commitment.     [name], MD 01/21/21 1312

## 2021-01-21 NOTE — Progress Notes (Signed)
Received e-mail from [facility] coordinator, sandhill in regards to placement.    This message was sent securely using Zix    *Caution - External email - see footer for warnings* Great morning Team [name]; I apologize for emailing you Aceton's report late. All the hard work and efforts by [employer] since before April 2022 has paid off. We have located a provider willing to provide services for [facility]. I worked on Designer, industrial/product for [name] to provide Residential Level 4 Services and Chief Financial Officer for [employer].  By the time I finished the report last night, my computer would not allow me to open and send Team Goldston the report.   I want to give Team Iam an update on the process of Trequan being approved for transferring his services to [facility].  I submitted Tavion's Client Specific request and paper SAR to IDD Director to review.  After Tena reviews it and no corrections are needed, I prepare Stanlee's Client Specific Request packet is passed on to the [facility]' Reviewer and once the reviewer approves the request, the packet is submitted to [facility]'s Review Board, which meets weekly on Fridays for approval.  Once Ival's Client Specific Request has been approved, a Health and Safety inspection is scheduled for Drayson's [name] (which is in [place]!!!!).  After Tina's [name] passes the health and safety inspection, Team Izaih will meet to discuss and develop Meliton's ISP update for Paxton's services to be provided by [employer].  Once Italo's ISP update packet is completed and submitted to [facility] for approval.  I know this process seems long but we are so close to providing [place] with a much better quality of life instead of being at the Emergency Room. We are shooting for service to begin effective 02/05/21. Please continue to be patient.  THIS IS FINALLY GOING TO HAPPEN!!!  Have a great day! Thanks!         [name]  [name], QP IDD Care Coordinator, [facility] [address] [place], [place]  [address] [phone] (office) Tuesday and Thursday (in the office) [phone] (cell)  Monday, Wednesday, and Friday (working from home) [phone] (fax)  CONFIDENTIALITY NOTICE: This message and any attachments included are from [facility] and are for sole use by the intended recipient(s). The information contained herein may include confidential or privileged information. Unauthorized review, forwarding, printing, copying, distributing, or using such information is strictly prohibited and may be unlawful. If you received this message in error, or have reason to believe you are not authorized to receive it, please contact the sender by reply email and destroy all copies of the original message. **Please be advised that any e-mail sent to and from this e-mail account is subject to the [facility] Public Records Law and may be disclosed to third parties. * To report fraud, waste and abuse, call the Medicaid tip-line at1-877-DMA-TIP1 ([phone]). Your call will remain anonymous.  WARNING: This email originated outside of [facility]. Even if this looks like a [employer], it is not. Do not provide your username, password, or any other personal information in response to this or any other email. [place] will never ask you for your username or password via email. DO NOT CLICK links or attachments unless you are positive the content is safe. If in doubt about the safety of this message, select the [employer] Report Phishing button, which forwards to IT Security.     This message was  secured by Zix.

## 2021-01-21 NOTE — ED Notes (Signed)
Patient sitting on side of bed calm and cooperative at this time. Provided patient snacks and advised to stay in room while we wait on breakfast.

## 2021-01-22 DIAGNOSIS — F840 Autistic disorder: Secondary | ICD-10-CM | POA: Diagnosis not present

## 2021-01-22 DIAGNOSIS — G40909 Epilepsy, unspecified, not intractable, without status epilepticus: Secondary | ICD-10-CM | POA: Diagnosis not present

## 2021-01-22 DIAGNOSIS — R569 Unspecified convulsions: Secondary | ICD-10-CM | POA: Diagnosis present

## 2021-01-22 DIAGNOSIS — Z79899 Other long term (current) drug therapy: Secondary | ICD-10-CM | POA: Diagnosis not present

## 2021-01-22 DIAGNOSIS — Z20822 Contact with and (suspected) exposure to covid-19: Secondary | ICD-10-CM | POA: Diagnosis not present

## 2021-01-22 NOTE — ED Notes (Signed)
MHT attempted to get BP on patient. Patient would not stop moving his arm. The he hollered out and ripped off BP cuff

## 2021-01-22 NOTE — ED Notes (Signed)
Patient woke up screaming. He got up out of bed and punched glass. Patient was given a snack

## 2021-01-22 NOTE — ED Provider Notes (Signed)
Emergency Medicine Observation Re-evaluation Note  Paul Hendricks is a 30 y.o. male, seen on rounds today.  Pt initially presented to the ED for complaints of No chief complaint on file. Currently, the patient is go back to group home.  Physical Exam  BP 110/80 (BP Location: Left Arm)   Pulse (!) 109   Temp 97.9 F (36.6 C) (Oral)   Resp 18   Ht 6\' 2"  (1.88 m)   Wt 91 kg   SpO2 100%   BMI 25.76 kg/m  Physical Exam General: awake Psych: calm  ED Course / MDM  EKG:EKG Interpretation  Date/Time:  Monday January 05 2021 01:56:14 EDT Ventricular Rate:  124 PR Interval:  138 QRS Duration: 74 QT Interval:  302 QTC Calculation: 433 R Axis:   88 Text Interpretation: Sinus tachycardia Otherwise normal ECG Confirmed by 11-30-1999 [phone]) on 01/05/2021 4:06:29 AM  I have reviewed the labs performed to date as well as medications administered while in observation.  Recent changes in the last 24 hours include nothing.  Plan  Current plan is for return to home.  Paul Hendricks is not under involuntary commitment.     [name], DO 01/22/21 [phone]

## 2021-01-23 DIAGNOSIS — G40909 Epilepsy, unspecified, not intractable, without status epilepticus: Secondary | ICD-10-CM | POA: Diagnosis not present

## 2021-01-23 NOTE — ED Notes (Signed)
Pt did not tolerate vital sign monitoring devices, bp cuff, pulsox etc and did not allow me to assess, only to obtain manual pulse and resp.

## 2021-01-23 NOTE — ED Provider Notes (Signed)
Emergency Medicine Observation Re-evaluation Note  Paul Hendricks is a 30 y.o. male, seen on rounds today.  Pt initially presented to the ED for complaints of No chief complaint on file. Currently, the patient is resting.  Physical Exam  BP 110/80 (BP Location: Left Arm)   Pulse (!) 109   Temp 97.9 F (36.6 C) (Oral)   Resp 18   Ht 6\' 2"  (1.88 m)   Wt 91 kg   SpO2 100%   BMI 25.76 kg/m  Physical Exam General: awake Cardiac:  Lungs:  Psych: calm  ED Course / MDM  EKG:EKG Interpretation  Date/Time:  Monday January 05 2021 01:56:14 EDT Ventricular Rate:  124 PR Interval:  138 QRS Duration: 74 QT Interval:  302 QTC Calculation: 433 R Axis:   88 Text Interpretation: Sinus tachycardia Otherwise normal ECG Confirmed by 11-30-1999 [phone]) on 01/05/2021 4:06:29 AM  I have reviewed the labs performed to date as well as medications administered while in observation.  Recent changes in the last 24 hours include nothing.  Plan  Current plan is for same as yeserday.      01/07/2021, DO 01/23/21 1158

## 2021-01-23 NOTE — ED Notes (Signed)
Pt agitated, yelling and hitting self on legs.

## 2021-01-23 NOTE — ED Notes (Signed)
Pt consumed 80% of meal, pt did not eat any vegetables.

## 2021-01-23 NOTE — ED Notes (Signed)
Pt very agitated, screaming in the room and trying to hurt himself. PRN meds given. Will continue to monitor.

## 2021-01-23 NOTE — ED Notes (Signed)
Meds late due to patient being asleep.

## 2021-01-24 DIAGNOSIS — G40909 Epilepsy, unspecified, not intractable, without status epilepticus: Secondary | ICD-10-CM | POA: Diagnosis not present

## 2021-01-24 NOTE — ED Notes (Signed)
Pt woke up from sleeping, ran out of unit upset and screaming , staff able to bring pt back to room.

## 2021-01-24 NOTE — ED Notes (Signed)
Pt moaning and banging on the glass window. Sitter, security, and [employer] at the bedside. Pt deescalated by verbal command and warm blankets given.

## 2021-01-24 NOTE — ED Notes (Signed)
patient

## 2021-01-24 NOTE — ED Provider Notes (Signed)
Emergency Medicine Observation Re-evaluation Note  Paul Hendricks is a 30 y.o. male, seen on rounds today.  Pt initially presented to the ED for complaints of No chief complaint on file. Currently, the patient is resting comfortably.  He had a combative agitated episode earlier this morning, he was apparently pointing at his mouth expressing concern about pain in that area.  Nursing staff evaluated him and did not notice anything atypical.  He is calm when I see the patient, follows commands but is mute.Marland Kitchen  Physical Exam  BP 110/80 (BP Location: Left Arm)   Pulse 90   Temp 97.9 F (36.6 C) (Oral)   Resp 18   Ht 6\' 2"  (1.88 m)   Wt 91 kg   SpO2 100%   BMI 25.76 kg/m  Physical Exam General: Well-appearing Mouth: No sublingual tenderness, uvula is midline.  No palpable fluctuance in the mouth concerning for abscess.  There is some erythema in the gingiva consistent with possible change of otitis.  We will give patient Magic mouthwash and pain medicine.  Not emergent. Psych: Nonverbal  ED Course / MDM  EKG:EKG Interpretation  Date/Time:  Monday January 05 2021 01:56:14 EDT Ventricular Rate:  124 PR Interval:  138 QRS Duration: 74 QT Interval:  302 QTC Calculation: 433 R Axis:   88 Text Interpretation: Sinus tachycardia Otherwise normal ECG Confirmed by 11-30[birth year]1999 [phone]) on 01/05/2021 4:06:29 AM  I have reviewed the labs performed to date as well as medications administered while in observation.  Recent changes in the last 24 hours include episode of agitation early this morning and some dental pain.  Plan  Current plan is for placement.  Paul Hendricks is not under involuntary commitment.     Paul Pastures, PA-C 01/24/21 1016    03/26/21 A, DO 01/24/21 1827

## 2021-01-24 NOTE — ED Notes (Signed)
Late note HS medication usually given crushed in food unable to give tonight d/t c/o  dental pain.

## 2021-01-24 NOTE — ED Notes (Signed)
Attempting to give po HS medicaton with cake pt refused d/t tooth pain doesn't want to eat at this time and historically will not take po medcations. Will notify provider of pt pain.

## 2021-01-24 NOTE — ED Notes (Signed)
Patient has screamed out several times this shift. Getting up out of bed frequently. MHT has seen patient pointing to his front teeth very frequently. He appears more restless then usual. Patient may need provider to look in his mouth to see if there are any areas of infection.

## 2021-01-24 NOTE — ED Notes (Signed)
Pt very agitated and pacing in and out of his room. Pt drooling and pointing to the top teeth of his mouth. Upon inspection, discoloration seen on the top teeth, but no obvious injuries from what was able to be seen.

## 2021-01-24 NOTE — ED Notes (Signed)
Pt resting comfortably. No s/s of distress.

## 2021-01-25 DIAGNOSIS — G40909 Epilepsy, unspecified, not intractable, without status epilepticus: Secondary | ICD-10-CM | POA: Diagnosis not present

## 2021-01-25 MED ORDER — BENZTROPINE MESYLATE 1 MG/ML IJ SOLN
1.0000 mg | Freq: Once | INTRAMUSCULAR | Status: AC
Start: 2021-01-25 — End: 2021-03-23
  Administered 2021-03-23: 1 mg via INTRAMUSCULAR
  Filled 2021-01-25 (×4): qty 2

## 2021-01-25 MED ORDER — STERILE WATER FOR INJECTION IJ SOLN
INTRAMUSCULAR | Status: AC
  Filled 2021-01-25: qty 10

## 2021-01-25 MED ORDER — ZIPRASIDONE MESYLATE 20 MG IM SOLR
20.0000 mg | Freq: Once | INTRAMUSCULAR | Status: AC
  Administered 2021-01-25: 20 mg via INTRAMUSCULAR
  Filled 2021-01-25 (×3): qty 20

## 2021-01-25 MED ORDER — BENZOCAINE 10 % MT GEL
Freq: Two times a day (BID) | OROMUCOSAL | Status: DC | PRN
  Administered 2021-01-28 – 2021-02-23 (×4): 1 via OROMUCOSAL
  Filled 2021-01-25 (×10): qty 9.4

## 2021-01-25 MED ORDER — CHLORHEXIDINE GLUCONATE 0.12 % MT SOLN
15.0000 mL | Freq: Two times a day (BID) | OROMUCOSAL | Status: DC
  Administered 2021-01-25 – 2021-02-10 (×21): 15 mL via OROMUCOSAL
  Filled 2021-01-25 (×33): qty 15

## 2021-01-25 NOTE — ED Provider Notes (Signed)
5:38 PM Was just called to evaluate patient's teeth which been bothering him more recently.  On my assessment, patient was happily eating a freeze pop.  Patient did not appear to be any distress.  I was able to look at his teeth and gums and although he clearly has poor dentition, I did not see any evidence of dental abscess and touching all of his teeth with the tongue depressor did not elicit any evidence of discomfort.  He has some mild redness around his base of his teeth with what appears to be recent oozing of blood.  History and exam do not appear to be consistent with a more severe ulcerative gingivitis at this time so we will start with some Peridex mouthwash and encouraged more teeth brushing.  We will give Tylenol as he already has ordered for him for discomfort.    Patient will clearly need dentistry to follow-up with after he is discharged or has a disposition secured.  Otherwise patient appeared in no distress at this time.   Caralina Nop, Canary Brim, MD 01/25/21 1739

## 2021-01-25 NOTE — ED Notes (Signed)
Pt becoming very agitated meds given per mar

## 2021-01-25 NOTE — ED Notes (Signed)
Pt sat up to eat dinner tray. Pt urinated on self while eating food.

## 2021-01-25 NOTE — ED Notes (Addendum)
Pt continues to yell out , pt finally convince to take evening medications in an oatmeal cookie. Order for geodon 20 mg and cogentin obtained but held to see if HS medications will be effective. Plan is to wait 1 hr and if Paul Hendricks continues to display current behavior Geodon and cogentin will be given for on going agitation.       Pt mouth examined edema and bleeding from lower incisor gum line noted provider aware.

## 2021-01-25 NOTE — ED Notes (Signed)
Pt got up yelling and ran out of door, security and staff redirected pt back to room.

## 2021-01-26 DIAGNOSIS — G40909 Epilepsy, unspecified, not intractable, without status epilepticus: Secondary | ICD-10-CM | POA: Diagnosis not present

## 2021-01-26 NOTE — ED Notes (Signed)
Pt screaming wordlessly and biting his right hand. Unable to re-direct pt at this time

## 2021-01-26 NOTE — ED Notes (Signed)
Pt laying of floor, yelling, unable to redirect.

## 2021-01-26 NOTE — Progress Notes (Signed)
Pt is expected to d/c to group home on 02/05/21. Group home is in process of an Health and Safety screening.    [name].Ica Daye, [name], LCSWA [name]  Transitions of Care Clinical Social Worker I Direct Dial: [phone]  Fax: [phone] [name]

## 2021-01-26 NOTE — ED Notes (Signed)
Pt awake and grimacing while trying to bite his hand. He appears to be in pain. PRN tylenol crushed and placed in his soup to try to alleviate his pain.

## 2021-01-26 NOTE — ED Notes (Signed)
Patient screaming and walking around unit. Patient trying to fight staff when redirected. Will admin PRN meds.

## 2021-01-26 NOTE — ED Notes (Signed)
Pt is resting in bed at this time 

## 2021-01-27 DIAGNOSIS — G40909 Epilepsy, unspecified, not intractable, without status epilepticus: Secondary | ICD-10-CM | POA: Diagnosis not present

## 2021-01-27 NOTE — Progress Notes (Signed)
Received E-mail from [facility] in regards to placement.  This message was sent securely using Zix    *Caution - External email - see footer for warnings* Great morning Paul Hendricks; [name] is still currently at the Emergency Room (since 10/23/20).A provider has been located and has agreed to provide Paul Hendricks with Residential Level 4 (enhanced rate) and [employer].  I have submitted a Tourist information centre manager request for [employer], [place], to provide [employer].  I asked for the plan to be effective 02/05/21.  Once [employer] has been reviewed and approved by  [employer] (on Friday, 01/30/21), Paul Hendricks [name] will have to pass a Engineer, technical sales.  After [name] passes the Health and Facilities manager then Paul Hendricks will be able to move in.  I will email Team Paul Hendricks as soon as I receive the approval for [employer] Client Specific Request.  I am copying Team Paul Hendricks on this email for everyone to be on the same page and have the latest update on the plan for Paul Hendricks transition move from [facility] to [place] [name] with [employer], [place].  I would like to have a treatment team meeting for [facility] before the move takes place.  Hopefully, I will be able to send out an email to Team Paul Hendricks to schedule Paul Hendricks's Treatment Team meeting Friday or first thing next week. If anyone has any questions or concerns, please feel free to contact me at the numbers or email below.  Have a great day!   Thanks! Paul D. Paul Hendricks, QP IDD Care Coordinator, [facility] [address] [place], [place]  [phone] (office) Tuesday and Thursday (in the office) [phone] (cell)  Monday, Wednesday, and Friday (working from home) [phone] (fax)   [name].Paul Hendricks, [name], LCSWA [facility] Paul Hendricks  Transitions of  Care Clinical Social Worker I Direct Dial: [phone]  Fax: [phone] [email or web address]

## 2021-01-27 NOTE — ED Provider Notes (Signed)
Emergency Medicine Observation Re-evaluation Note  Pablo Stauffer is a 30 y.o. male, seen on rounds today.  Pt initially presented to the ED for complaints of No chief complaint on file. Currently, the patient is awaiting placement.  Physical Exam  BP (!) 127/101 (BP Location: Left Arm)   Pulse (!) 115   Temp 97.9 F (36.6 C) (Oral)   Resp 18   Ht 6\' 2"  (1.88 m)   Wt 91 kg   SpO2 99%   BMI 25.76 kg/m  Physical Exam General: Sleeping Cardiac: Extremities well perfused Lungs: Breathing even and unlabored Psych: Deferred  ED Course / MDM  EKG:EKG Interpretation  Date/Time:  Monday January 05 2021 01:56:14 EDT Ventricular Rate:  124 PR Interval:  138 QRS Duration: 74 QT Interval:  302 QTC Calculation: 433 R Axis:   88 Text Interpretation: Sinus tachycardia Otherwise normal ECG Confirmed by 11-30-1999 [phone]) on 01/05/2021 4:06:29 AM  I have reviewed the labs performed to date as well as medications administered while in observation.  Recent changes in the last 24 hours include continues to await placement.  Plan  Current plan is for facility placement.  Quante Pettry is not under involuntary commitment.     [name], MD 01/27/21 [phone]

## 2021-01-27 NOTE — Progress Notes (Signed)
Pt is restless and will not remain still to take vital signs at this time.  Will continue to monitor and try again when appropriate.

## 2021-01-28 DIAGNOSIS — G40909 Epilepsy, unspecified, not intractable, without status epilepticus: Secondary | ICD-10-CM | POA: Diagnosis not present

## 2021-01-28 NOTE — ED Notes (Signed)
Patient in room moaning, tylenol and orajel admin

## 2021-01-28 NOTE — ED Notes (Signed)
Attempted mouth care on patient and patient refused pushing RN hand away

## 2021-01-28 NOTE — ED Notes (Signed)
Pt taken for a walk to see if it will calm him down

## 2021-01-28 NOTE — ED Notes (Signed)
Patient walking around TCU holding mouth.

## 2021-01-28 NOTE — ED Notes (Signed)
Pt woke up with mouth pain. Pt given meds ordered for mouth pain. Tylenol also crushed and given in chocolate pudding. Ativan shot also given IM to pt bc getting anxious/agitated.

## 2021-01-29 DIAGNOSIS — G40909 Epilepsy, unspecified, not intractable, without status epilepticus: Secondary | ICD-10-CM | POA: Diagnosis not present

## 2021-01-29 NOTE — ED Notes (Signed)
Pt is given a sandwich and a drink.

## 2021-01-29 NOTE — ED Notes (Addendum)
Pt eat 95% of his lunch, bed is change and so is pt.

## 2021-01-29 NOTE — ED Notes (Signed)
Pt wake up and I notice that he had wet the bed pt was clean and change, Pt is in his room at this time.

## 2021-01-29 NOTE — ED Notes (Signed)
Pt eat 90% of his dinner.  

## 2021-01-29 NOTE — ED Notes (Signed)
Pt wake up, and I notice that the bed was wet and pt also had a BM on himself. Me and the nurse took him to the shower room and clean him up while one of the other tech clean and make his bed. Pt is all clean and in his bed not sleeping at this time.

## 2021-01-29 NOTE — ED Notes (Signed)
Pt alert this shift. Took medication this shift. Pt upset at times, redirected and  reassured.   Pt ate meals with minimal help.

## 2021-01-30 DIAGNOSIS — G40909 Epilepsy, unspecified, not intractable, without status epilepticus: Secondary | ICD-10-CM | POA: Diagnosis not present

## 2021-01-30 NOTE — ED Provider Notes (Signed)
Emergency Medicine Observation Re-evaluation Note  Paul Hendricks is a 30 y.o. male, seen on rounds today.  Pt initially presented to the ED for complaints of No chief complaint on file.  Hx of autism, SI, bipolar, nonverbal. Currently, the patient is calm, sleeping.   Physical Exam  BP (!) 118/103 (BP Location: Left Arm)   Pulse (!) 134   Temp 97.9 F (36.6 C) (Oral)   Resp 20   Ht 6\' 2"  (1.88 m)   Wt 91 kg   SpO2 96%   BMI 25.76 kg/m  Physical Exam Vitals and nursing note reviewed.  Constitutional:      Appearance: Normal appearance.  HENT:     Head: Normocephalic and atraumatic.  Cardiovascular:     Rate and Rhythm: Normal rate.  Pulmonary:     Effort: No respiratory distress.  Neurological:     Mental Status: He is alert.     GCS: GCS eye subscore is 4. GCS verbal subscore is 1. GCS motor subscore is 6.     Comments: nonverbal  Psychiatric:     Comments: Calm, non resistant      ED Course / MDM  EKG:EKG Interpretation  Date/Time:  Monday January 05 2021 01:56:14 EDT Ventricular Rate:  124 PR Interval:  138 QRS Duration: 74 QT Interval:  302 QTC Calculation: 433 R Axis:   88 Text Interpretation: Sinus tachycardia Otherwise normal ECG Confirmed by 11-30-1999 [phone]) on 01/05/2021 4:06:29 AM  I have reviewed the labs performed to date as well as medications administered while in observation.  Recent changes in the last 24 hours include awaiting placement.   Plan  Current plan is for awaiting placement.    Paul Hendricks is not under involuntary commitment.     Paul Hendricks P, DO 01/30/21 1200

## 2021-01-30 NOTE — ED Notes (Signed)
Patient did receive his dinner meal tray and ate 100 percent of his meal. Even though he was having trouble to cut his food but he was able have some assistant by the Nurse Tech to cut his meal.  Also, Patient linen was change and was given warm blankets to comfort him. Pt was up at the moment but was given a snack at the time and has finally laid back down.

## 2021-01-31 DIAGNOSIS — G40909 Epilepsy, unspecified, not intractable, without status epilepticus: Secondary | ICD-10-CM | POA: Diagnosis not present

## 2021-01-31 NOTE — ED Notes (Signed)
Pt very agitated tonight, biting his hand, yelling, aggressive towards staff. PRN meds given. Will continue to monitor.

## 2021-01-31 NOTE — ED Notes (Signed)
Pt alert this shift. Pt ate breakfast. Pt took medication. No aggression noted. Pt resting comfortably.

## 2021-01-31 NOTE — ED Provider Notes (Signed)
Emergency Medicine Observation Re-evaluation Note  Paul Hendricks is a 30 y.o. male, seen on rounds today.  Pt initially presented to the ED for complaints of No chief complaint on file.  Hx of autism, SI, bipolar, nonverbal. Currently, the patient is calm, sleeping.   Physical Exam  BP 133/90 (BP Location: Left Arm)   Pulse (!) 106   Temp 98.8 F (37.1 C) (Oral)   Resp 20   Ht 6\' 2"  (1.88 m)   Wt 91 kg   SpO2 99%   BMI 25.76 kg/m  Physical Exam Vitals and nursing note reviewed.  Constitutional:      Appearance: Normal appearance.  HENT:     Head: Normocephalic and atraumatic.  Cardiovascular:     Rate and Rhythm: Normal rate.  Pulmonary:     Effort: No respiratory distress.  Neurological:     Comments: nonverbal  Psychiatric:     Comments: Calm, non resistant      ED Course / MDM  EKG:EKG Interpretation  Date/Time:  Monday January 05 2021 01:56:14 EDT Ventricular Rate:  124 PR Interval:  138 QRS Duration: 74 QT Interval:  302 QTC Calculation: 433 R Axis:   88 Text Interpretation: Sinus tachycardia Otherwise normal ECG Confirmed by 11-30-1999 [phone]) on 01/05/2021 4:06:29 AM  I have reviewed the labs performed to date as well as medications administered while in observation.  Recent changes in the last 24 hours include awaiting placement.   Plan  Current plan is for awaiting placement.    Oliverio Cho is not under involuntary commitment.      [name], DO 01/31/21 [phone]

## 2021-02-01 DIAGNOSIS — G40909 Epilepsy, unspecified, not intractable, without status epilepticus: Secondary | ICD-10-CM | POA: Diagnosis not present

## 2021-02-01 MED ORDER — ZIPRASIDONE MESYLATE 20 MG IM SOLR: 20.0000 mg | Freq: Once | INTRAMUSCULAR | Status: AC

## 2021-02-01 MED ORDER — STERILE WATER FOR INJECTION IJ SOLN
INTRAMUSCULAR | Status: AC
  Administered 2021-02-01: 10 mL
  Filled 2021-02-01: qty 10

## 2021-02-01 MED ORDER — ZIPRASIDONE MESYLATE 20 MG IM SOLR
INTRAMUSCULAR | Status: AC
  Administered 2021-02-01: 20 mg via INTRAMUSCULAR
  Filled 2021-02-01: qty 20

## 2021-02-01 MED ORDER — KETOROLAC TROMETHAMINE 60 MG/2ML IM SOLN
60.0000 mg | Freq: Once | INTRAMUSCULAR | Status: AC
  Administered 2021-02-01: 60 mg via INTRAMUSCULAR
  Filled 2021-02-01: qty 2

## 2021-02-01 NOTE — ED Notes (Signed)
Pt has eaten breakfast and lunch. Pt resting comfortably. No s/s of distress.   No aggression noted.

## 2021-02-01 NOTE — ED Notes (Signed)
Pt agitated and keep grinding his teeth. PRN oraljel given but not effective. EDP informed. 1x dose of 60mg  Toradol given. Will continue to monitor.

## 2021-02-01 NOTE — ED Notes (Signed)
Pt resting comfortably, sitter at bedside.  

## 2021-02-01 NOTE — ED Provider Notes (Addendum)
Patient in ED for extended time due to behavioral health issues. I was approached by the nurse who is requesting pain relief from dental pain. He has been receiving Tylenol but the patient reports it has not been helping and refuses to take more. Issue is not related to one tooth but to generalized gingivitis. OraGel not working either.   Chart reviewed. No kidney compromise. NKDA. IM Toradol ordered.   5:40 - patient still agitated, requiring security officers at bedside. No change with Haldol, Ativan. Discussed with pharmacy. Will give Geodon 20 mg IM.    Elpidio Anis, PA-C 02/01/21 5697    Marily Memos, MD 02/01/21 0520    Elpidio Anis, PA-C 02/01/21 0543    Marily Memos, MD 02/01/21 [phone]

## 2021-02-01 NOTE — ED Provider Notes (Signed)
Emergency Medicine Observation Re-evaluation Note  Eliya Geiman is a 30 y.o. male, seen on rounds today.  Pt initially presented to the ED for complaints of No chief complaint on file. Currently, the patient is asleep, resting comfortably.  Physical Exam  BP 133/90 (BP Location: Left Arm)   Pulse (!) 106   Temp 98.8 F (37.1 C) (Oral)   Resp 20   Ht 6\' 2"  (1.88 m)   Wt 91 kg   SpO2 99%   BMI 25.76 kg/m  Physical Exam General: Asleep, resting comfortably, NAD Lungs: Respirations even and unlabored Psych: No apparent distress, no concerns at this time  ED Course / MDM  EKG:EKG Interpretation  Date/Time:  Monday January 05 2021 01:56:14 EDT Ventricular Rate:  124 PR Interval:  138 QRS Duration: 74 QT Interval:  302 QTC Calculation: 433 R Axis:   88 Text Interpretation: Sinus tachycardia Otherwise normal ECG Confirmed by 11-30-1999 [phone]) on 01/05/2021 4:06:29 AM  I have reviewed the labs performed to date as well as medications administered while in observation.  Recent changes in the last 24 hours include patient had an episode of agitation overnight thought to be due to dental pain and gingivitis.  Prior note documented by overnight PA reports that Tylenol and Orajel had not been working, IM Toradol was administered.  Patient still had agitation overnight requiring security to be bedside.  Geodon 20 mg IM given at 0540.  Plan  Current plan is for waiting on placement via social work.  Keo Schirmer is not under involuntary commitment.     [name], MD 02/01/21 [phone]

## 2021-02-01 NOTE — ED Notes (Signed)
Pt remain agitated, biting his hand off. Yelling and screaming in the room and physically aggressive towards staff. PRN meds given. Will continue to monitor.

## 2021-02-01 NOTE — ED Notes (Signed)
Patient remain agitated since 4 am. 1 x dose toradol 60 mg and PRN meds for agitation given but both not effective. EDP informed. 1x dose geodon 20mg  ordered.  Will continue to monitor.

## 2021-02-02 DIAGNOSIS — G40909 Epilepsy, unspecified, not intractable, without status epilepticus: Secondary | ICD-10-CM | POA: Diagnosis not present

## 2021-02-02 NOTE — ED Provider Notes (Signed)
Emergency Medicine Observation Re-evaluation Note  Paul Hendricks is a 30 y.o. male, seen on rounds today.  Pt initially presented to the ED for complaints of No chief complaint on file. Currently, the patient is resting comfortably.  Physical Exam  BP (!) 144/88 (BP Location: Left Arm)   Pulse (!) 105   Temp 98.8 F (37.1 C) (Oral)   Resp 20   Ht 6\' 2"  (1.88 m)   Wt 91 kg   SpO2 95%   BMI 25.76 kg/m  Physical Exam General: NAD ED Course / MDM  EKG:EKG Interpretation  Date/Time:  Monday January 05 2021 01:56:14 EDT Ventricular Rate:  124 PR Interval:  138 QRS Duration: 74 QT Interval:  302 QTC Calculation: 433 R Axis:   88 Text Interpretation: Sinus tachycardia Otherwise normal ECG Confirmed by 11-30-1999 [phone]) on 01/05/2021 4:06:29 AM  I have reviewed the labs performed to date as well as medications administered while in observation.  Recent changes in the last 24 hours include none reported by staff.  Plan  Current plan is for placement with social work.  Paul Hendricks is not under involuntary commitment.     Paul Pastures, MD 02/02/21 1106

## 2021-02-02 NOTE — Progress Notes (Addendum)
CSW spoke pt's [facility] [name] [name], she reported [name] is still waiting for Health and Safety to be completed. No expected d/c date at this time. She stated she would like to set up a meeting with everyone on his team before he transitions to [name].    Adden 3:10pm  Expected d/c 02/09/21, pt will need a COVID test and 30-day supply of all medications sent over to [name] preferred pharmacy upon d/c.   [facility]  [address] [place] [phone]    [name].Liann Spaeth, [name], LCSWA [name]  Transitions of Care Clinical Social Worker I Direct Dial: [phone]  Fax: [phone] [name]

## 2021-02-03 DIAGNOSIS — G40909 Epilepsy, unspecified, not intractable, without status epilepticus: Secondary | ICD-10-CM | POA: Diagnosis not present

## 2021-02-03 NOTE — Progress Notes (Signed)
Provider notified that safety sitter order has expired. Waiting on new orders.

## 2021-02-03 NOTE — ED Notes (Signed)
Breakfast tray given. °

## 2021-02-03 NOTE — ED Provider Notes (Signed)
Emergency Medicine Observation Re-evaluation Note  Paul Hendricks is a 30 y.o. male, seen on rounds today.  Pt initially presented to the ED for complaints of No chief complaint on file. Currently, the patient is asleep.  Physical Exam  BP (!) 145/103 (BP Location: Left Arm)   Pulse (!) 111   Temp 98.8 F (37.1 C) (Oral)   Resp 20   Ht 6\' 2"  (1.88 m)   Wt 91 kg   SpO2 97%   BMI 25.76 kg/m  Physical Exam General: NAD Cardiac: well perfused Lungs: even and unlabored Psych: No distress, asleep  ED Course / MDM  EKG:EKG Interpretation  Date/Time:  Monday January 05 2021 01:56:14 EDT Ventricular Rate:  124 PR Interval:  138 QRS Duration: 74 QT Interval:  302 QTC Calculation: 433 R Axis:   88 Text Interpretation: Sinus tachycardia Otherwise normal ECG Confirmed by 11-30-1999 [phone]) on 01/05/2021 4:06:29 AM  I have reviewed the labs performed to date as well as medications administered while in observation.  Recent changes in the last 24 hours include Pt agitated this am and received PRN oral medications.  Plan  Current plan is for Social work note 9/12: CSW spoke pt's Paul Hendricks, she reported [name] is still waiting for Health and Safety to be completed. No expected d/c date at this time. She stated she would like to set up a meeting with everyone on his team before he transitions to [name].   Adden 3:10pm   Expected d/c 02/09/21, pt will need a COVID test and 30-day supply of all medications sent over to [name] preferred pharmacy upon d/c.02/11/21  Paul Hendricks is not under involuntary commitment.     Paul Pastures, MD 02/03/21 [phone]

## 2021-02-03 NOTE — ED Notes (Addendum)
Patient was given all of his medications in yogurt. Patient took 2 bites of yogurt and spit it out. Patient most likely did not receive most of his nightly medication as it was crushed up in the yogurt. RN will report to oncoming RN that patient did not eat his yogurt and should try something else for next medication pass.

## 2021-02-03 NOTE — ED Notes (Signed)
Pt is eating now

## 2021-02-03 NOTE — Progress Notes (Signed)
Pt woke up agitated, making loud vocalizations. Appropriate medication administered.

## 2021-02-03 NOTE — ED Notes (Addendum)
Pt. woke up from having a bad dream walk out of room ran out double door and ran in the hall start beating the wall pt was redirect by staff and security to his room [facility].

## 2021-02-04 DIAGNOSIS — G40909 Epilepsy, unspecified, not intractable, without status epilepticus: Secondary | ICD-10-CM | POA: Diagnosis not present

## 2021-02-04 NOTE — ED Notes (Signed)
Patient awake and requesting bread.  Patient provided with bread and juice.

## 2021-02-04 NOTE — ED Provider Notes (Signed)
Emergency Medicine Observation Re-evaluation Note  Paul Hendricks is a 30 y.o. male, seen on rounds today.  Pt initially presented to the ED for complaints of No chief complaint on file. Currently, the patient is resting comfortably.  Physical Exam  BP (!) 145/103 (BP Location: Left Arm)   Pulse (!) 111   Temp 98.8 F (37.1 C) (Oral)   Resp 20   Ht 6\' 2"  (1.88 m)   Wt 91 kg   SpO2 97%   BMI 25.76 kg/m  Physical Exam General: NAD   ED Course / MDM  EKG:EKG Interpretation  Date/Time:  Monday January 05 2021 01:56:14 EDT Ventricular Rate:  124 PR Interval:  138 QRS Duration: 74 QT Interval:  302 QTC Calculation: 433 R Axis:   88 Text Interpretation: Sinus tachycardia Otherwise normal ECG Confirmed by 11-30-1999 [phone]) on 01/05/2021 4:06:29 AM  I have reviewed the labs performed to date as well as medications administered while in observation.  Recent changes in the last 24 hours include agitation.  Plan  Current plan is for placement.  Expected d/c 02/09/21, pt will need a COVID test and 30-day supply of all medications sent over to [name] preferred pharmacy upon d/c.02/11/21  [facility]  [address] [place] [phone]   Paul Hendricks is not under involuntary commitment.     [name], MD 02/04/21 [phone]

## 2021-02-04 NOTE — ED Notes (Signed)
Pt refuse vitals  

## 2021-02-05 DIAGNOSIS — G40909 Epilepsy, unspecified, not intractable, without status epilepticus: Secondary | ICD-10-CM | POA: Diagnosis not present

## 2021-02-05 MED ORDER — OMEGA-3-ACID ETHYL ESTERS 1 G PO CAPS: 1.0000 g | ORAL_CAPSULE | Freq: Every day | ORAL | 0 refills | Status: DC

## 2021-02-05 MED ORDER — LORAZEPAM 2 MG/ML IJ SOLN
2.0000 mg | Freq: Once | INTRAMUSCULAR | Status: AC
  Administered 2021-02-05: 2 mg via INTRAMUSCULAR
  Filled 2021-02-05: qty 1

## 2021-02-05 MED ORDER — MELATONIN 5 MG SL SUBL
10.0000 mg | SUBLINGUAL_TABLET | Freq: Every day | SUBLINGUAL | 0 refills | Status: DC
Start: 2021-02-05 — End: 2021-05-01

## 2021-02-05 MED ORDER — CHLORPROMAZINE HCL 50 MG PO TABS: 50.0000 mg | ORAL_TABLET | Freq: Three times a day (TID) | ORAL | 0 refills | Status: DC | PRN

## 2021-02-05 MED ORDER — TOPIRAMATE 25 MG PO TABS: 25.0000 mg | ORAL_TABLET | Freq: Two times a day (BID) | ORAL | 0 refills | Status: DC

## 2021-02-05 MED ORDER — MIRTAZAPINE 15 MG PO TABS: 15.0000 mg | ORAL_TABLET | Freq: Every day | ORAL | 0 refills | Status: DC

## 2021-02-05 MED ORDER — GUANFACINE HCL 2 MG PO TABS: 2.0000 mg | ORAL_TABLET | Freq: Every day | ORAL | 0 refills | Status: DC

## 2021-02-05 MED ORDER — RISPERIDONE 1 MG PO TABS: 1.0000 mg | ORAL_TABLET | Freq: Two times a day (BID) | ORAL | 0 refills | Status: DC

## 2021-02-05 MED ORDER — FUROSEMIDE 20 MG PO TABS: 20.0000 mg | ORAL_TABLET | Freq: Every day | ORAL | 0 refills | Status: DC

## 2021-02-05 MED ORDER — ATORVASTATIN CALCIUM 20 MG PO TABS: 20.0000 mg | ORAL_TABLET | Freq: Every day | ORAL | 0 refills | Status: DC

## 2021-02-05 MED ORDER — NALTREXONE HCL 50 MG PO TABS: 25.0000 mg | ORAL_TABLET | Freq: Two times a day (BID) | ORAL | 0 refills | Status: DC

## 2021-02-05 NOTE — Progress Notes (Signed)
  CSW spoke with [facility], she stated a meeting is scheduled for 02/06/21, to discuss pt's transition to [name] home. CC stated pt is still expected to d/c on Monday the 19th. CC will update CSW on the exact time of the transition meeting. CSW continue to follow.   [name].Fidencia Mccloud, [name], LCSWA [name]  Transitions of Care Clinical Social Worker I Direct Dial: [phone]  Fax: [phone] [name]

## 2021-02-05 NOTE — ED Notes (Signed)
Patient refused vitals.

## 2021-02-05 NOTE — ED Provider Notes (Addendum)
Emergency Medicine Observation Re-evaluation Note  Paul Hendricks is a 30 y.o. male, seen on rounds today.  Pt initially presented to the ED for complaints of No chief complaint on file. Currently, the patient is walking around.  Physical Exam  BP (!) 145/103 (BP Location: Left Arm)   Pulse (!) 111   Temp 98.8 F (37.1 C) (Oral)   Resp 20   Ht 6\' 2"  (1.88 m)   Wt 91 kg   SpO2 97%   BMI 25.76 kg/m  Physical Exam General: NAD Cardiac: slight tachycardia Lungs: no increased WOB Psych: calm  ED Course / MDM  EKG:EKG Interpretation  Date/Time:  Monday January 05 2021 01:56:14 EDT Ventricular Rate:  124 PR Interval:  138 QRS Duration: 74 QT Interval:  302 QTC Calculation: 433 R Axis:   88 Text Interpretation: Sinus tachycardia Otherwise normal ECG Confirmed by 11-30-1999 [phone]) on 01/05/2021 4:06:29 AM  I have reviewed the labs performed to date as well as medications administered while in observation.  Recent changes in the last 24 hours include none.  Plan  Current plan is for plan is for expected discharge on 9/19.10/19  Rohn Fritsch is not under involuntary commitment.  pt will need a COVID test and 30-day supply of all medications sent over to [name] preferred pharmacy upon d/c.   [facility]  [address] [place] [facility] [phone]  I HAVE SENT A 30 DAY SUPPLY TODAY TO THE PHARMACY LISTED ABOVE FOR THE MEDICATIONS THAT HE IS CURRENTLY ON.      [phone], MD 02/05/21 02/07/21    [id number], MD 02/05/21 1148

## 2021-02-06 DIAGNOSIS — G40909 Epilepsy, unspecified, not intractable, without status epilepticus: Secondary | ICD-10-CM | POA: Diagnosis not present

## 2021-02-06 NOTE — ED Notes (Signed)
Pt became agitated, meds given per [facility]

## 2021-02-06 NOTE — ED Provider Notes (Signed)
Emergency Medicine Observation Re-evaluation Note  Ammaar Encina is a 30 y.o. male, seen on rounds today.  Currently, the patient is waiting for group home disposition  Physical Exam  BP 126/83 (BP Location: Left Arm)   Pulse (!) 122   Temp 98.8 F (37.1 C) (Oral)   Resp 18   Ht 1.88 m (6\' 2" )   Wt 91 kg   SpO2 97%   BMI 25.76 kg/m  Physical Exam General: No acute distress Cardiac: Regular rate Lungs: Breathing easily   ED Course / MDM  EKG:EKG Interpretation  Date/Time:  Monday January 05 2021 01:56:14 EDT Ventricular Rate:  124 PR Interval:  138 QRS Duration: 74 QT Interval:  302 QTC Calculation: 433 R Axis:   88 Text Interpretation: Sinus tachycardia Otherwise normal ECG Confirmed by 11-30-1999 [phone]) on 01/05/2021 4:06:29 AM  I have reviewed the labs performed to date as well as medications administered while in observation.  Recent changes in the last 24 hours include no acute changes.  Plan  Current plan is for emergency discharge ISP update meeting planned for this afternoon.  Fields Oros is not under involuntary commitment.     [name], MD 02/06/21 [phone]

## 2021-02-06 NOTE — Progress Notes (Addendum)
Emergency Discharge ISP Update Meeting will be today at 3pm. CSW continue to follow.    5:02pm Pt will d/c on Monday 02/09/2021 to [name]. Pt will need COVID test done this weekend.CSW to contact [name] provider to confirm time of pick up.    [name].Teion Ballin, [name], LCSWA [name]  Transitions of Care Clinical Social Worker I Direct Dial: [phone]  Fax: [phone] [name]

## 2021-02-06 NOTE — ED Notes (Signed)
Pt continues loud negative vocalization, coming out of room, biting himself, hitting his head w/ his hand.  Medication administered approximately 35 minutes ago has yet to take effect. Security at bedside, sitter at bedside. Will continue to monitor.

## 2021-02-07 DIAGNOSIS — G40909 Epilepsy, unspecified, not intractable, without status epilepticus: Secondary | ICD-10-CM | POA: Diagnosis not present

## 2021-02-07 NOTE — ED Notes (Signed)
Pt had periods of agitation today, ate meal and received meds with no issues

## 2021-02-07 NOTE — ED Provider Notes (Signed)
Emergency Medicine Observation Re-evaluation Note  Paul Hendricks is a 30 y.o. male, seen on rounds today.  Pt initially presented to the ED for complaints of No chief complaint on file. Currently, the patient is awaiting placement.  Physical Exam  BP 126/83 (BP Location: Left Arm)   Pulse (!) 136   Temp 98.8 F (37.1 C) (Oral)   Resp 18   Ht 1.88 m (6\' 2" )   Wt 91 kg   SpO2 97%   BMI 25.76 kg/m  Physical Exam General: Calm cooperative  Psych: No agitation  ED Course / MDM  EKG:EKG Interpretation  Date/Time:  Monday January 05 2021 01:56:14 EDT Ventricular Rate:  124 PR Interval:  138 QRS Duration: 74 QT Interval:  302 QTC Calculation: 433 R Axis:   88 Text Interpretation: Sinus tachycardia Otherwise normal ECG Confirmed by 11-30-1999 [phone]) on 01/05/2021 4:06:29 AM  I have reviewed the labs performed to date as well as medications administered while in observation.  Recent changes in the last 24 hours include none.  Plan  Current plan is for placement likely next week.  Paul Hendricks is not under involuntary commitment.     [name], MD 02/07/21 1021

## 2021-02-07 NOTE — ED Notes (Signed)
Pt got up yelling and ran out of TCU, security and staff brought pt back. Prn meds given per mar

## 2021-02-07 NOTE — ED Notes (Signed)
Patient has gotten up and ran out of unit 2x in 10 minutes

## 2021-02-08 DIAGNOSIS — G40909 Epilepsy, unspecified, not intractable, without status epilepticus: Secondary | ICD-10-CM | POA: Diagnosis not present

## 2021-02-08 LAB — RESP PANEL BY RT-PCR (FLU A&B, COVID) ARPGX2
Influenza A by PCR: NEGATIVE
Influenza B by PCR: NEGATIVE
SARS Coronavirus 2 by RT PCR: NEGATIVE

## 2021-02-08 MED ORDER — PENICILLIN V POTASSIUM 250 MG/5ML PO SOLR
500.0000 mg | Freq: Three times a day (TID) | ORAL | Status: DC
  Administered 2021-02-08 – 2021-02-10 (×7): 500 mg via ORAL
  Filled 2021-02-08 (×10): qty 10

## 2021-02-08 MED ORDER — IBUPROFEN 100 MG/5ML PO SUSP
400.0000 mg | Freq: Four times a day (QID) | ORAL | Status: DC
  Administered 2021-02-08: 400 mg via ORAL
  Filled 2021-02-08 (×4): qty 20

## 2021-02-08 MED ORDER — IBUPROFEN 100 MG/5ML PO SUSP
400.0000 mg | Freq: Four times a day (QID) | ORAL | Status: DC
  Filled 2021-02-08: qty 20

## 2021-02-08 NOTE — ED Provider Notes (Addendum)
Emergency Medicine Observation Re-evaluation Note  Paul Hendricks is a 30 y.o. male, seen on rounds today.  Pt initially presented to the ED for complaints of No chief complaint on file. Currently, the patient is resting comfortably.  Physical Exam  BP 126/83 (BP Location: Left Arm)   Pulse (!) 136   Temp 98.8 F (37.1 C) (Oral)   Resp 18   Ht 1.88 m (6\' 2" )   Wt 91 kg   SpO2 97%   BMI 25.76 kg/m  Physical Exam General: No acute distress  ED Course / MDM  EKG:EKG Interpretation  Date/Time:  Monday January 05 2021 01:56:14 EDT Ventricular Rate:  124 PR Interval:  138 QRS Duration: 74 QT Interval:  302 QTC Calculation: 433 R Axis:   88 Text Interpretation: Sinus tachycardia Otherwise normal ECG Confirmed by 11-30-1999 [phone]) on 01/05/2021 4:06:29 AM  I have reviewed the labs performed to date as well as medications administered while in observation.  Recent changes in the last 24 hours include cooperative.  Plan  Current plan is for placement next week.  Paul Hendricks is not under involuntary commitment.  2:48 PM Called to bedside by patient's nurse concern for possible dental pain.  Performed oral exam and did not see any evidence of IVs abscess.  Simple dentition noted.  He has no submental lymphadenopathy.  No evidence of [place]igs.  Prescribed Motrin and penicillin at this time     Paul Pastures, MD 02/08/21 02/10/21    [id number], MD 02/08/21 [phone]

## 2021-02-08 NOTE — ED Notes (Signed)
Patient has had a difficulty time resting this shift. Patient has screamed out several times. Patient got up and was punching the glass.

## 2021-02-08 NOTE — ED Notes (Signed)
Pt showing signs of mouth pain by holding his jaw and pointing to area. MD made aware and medications will follow in the mar

## 2021-02-08 NOTE — ED Notes (Addendum)
Pt kept pointing to his mouth as if in pain. Meds given per MAR. Staff attempted to swab mouth with mouthrinse that is in mar.

## 2021-02-09 DIAGNOSIS — G40909 Epilepsy, unspecified, not intractable, without status epilepticus: Secondary | ICD-10-CM | POA: Diagnosis not present

## 2021-02-09 MED ORDER — IBUPROFEN 200 MG PO TABS
400.0000 mg | ORAL_TABLET | Freq: Four times a day (QID) | ORAL | Status: DC
  Administered 2021-02-09 – 2021-04-28 (×162): 400 mg via ORAL
  Filled 2021-02-09 (×181): qty 2

## 2021-02-09 NOTE — Progress Notes (Signed)
Address is still not verified. [name] stated patient cannot be discharged today. Unknown when this will be completed.

## 2021-02-09 NOTE — Progress Notes (Signed)
CSW contacted [name] who stated they were going to pick-up patient around 5:00 PM but they have to clarify the right address for patient. [name] stated he sent out an e-mail to the team and is waiting to here back.

## 2021-02-09 NOTE — ED Notes (Signed)
Pt given food, bath, and clean sheets. Pt ate and returned to bed without incident.

## 2021-02-09 NOTE — ED Notes (Addendum)
Today, pt appears to be in pain. He has been holding his stomach and touching his buttocks while frequently yelling out, pounding the walls with his fists, and biting his hand. He did consume a 17g packet of miralax mixed in chicken noodle soup. He has been redirectable.

## 2021-02-10 DIAGNOSIS — G40909 Epilepsy, unspecified, not intractable, without status epilepticus: Secondary | ICD-10-CM | POA: Diagnosis not present

## 2021-02-10 MED ORDER — STERILE WATER FOR INJECTION IJ SOLN
INTRAMUSCULAR | Status: AC
  Administered 2021-02-10: 10 mL
  Filled 2021-02-10: qty 10

## 2021-02-10 MED ORDER — ZIPRASIDONE MESYLATE 20 MG IM SOLR
20.0000 mg | Freq: Once | INTRAMUSCULAR | Status: AC
  Administered 2021-02-10: 20 mg via INTRAMUSCULAR
  Filled 2021-02-10: qty 20

## 2021-02-10 MED ORDER — CHLORHEXIDINE GLUCONATE 0.12% ORAL RINSE (MEDLINE KIT)
15.0000 mL | Freq: Four times a day (QID) | OROMUCOSAL | Status: DC
  Administered 2021-02-19 – 2021-03-22 (×3): 15 mL via OROMUCOSAL
  Filled 2021-02-10 (×5): qty 15

## 2021-02-10 MED ORDER — LORAZEPAM 2 MG/ML IJ SOLN
2.0000 mg | Freq: Once | INTRAMUSCULAR | Status: AC
Start: 2021-02-10 — End: 2021-03-12
  Administered 2021-03-12: 2 mg via INTRAVENOUS
  Filled 2021-02-10: qty 1

## 2021-02-10 NOTE — ED Provider Notes (Addendum)
Emergency Medicine Observation Re-evaluation Note  Paul Hendricks is a 30 y.o. male, seen on rounds today.  Pt initially presented to the ED for complaints of No chief complaint on file. Currently, the patient is resting in bed.  Physical Exam  BP (!) 141/102 (BP Location: Left Wrist) Comment (BP Location): holding him down from moving  Pulse (!) 101   Temp 98.8 F (37.1 C) (Oral)   Resp 18   Ht 6\' 2"  (1.88 m)   Wt 91 kg   SpO2 99%   BMI 25.76 kg/m  Physical Exam General: resting, mildly anxious when aroused HEENT: No tongue elevation, no evidence of odontogenic abscess/periapical abscess, gum lines appears to be inflamed with evidence of gingivitis and poor dentition.  Lungs: Even and unlabored Psych: resting, mildly anxious  ED Course / MDM  EKG:EKG Interpretation  Date/Time:  Monday January 05 2021 01:56:14 EDT Ventricular Rate:  124 PR Interval:  138 QRS Duration: 74 QT Interval:  302 QTC Calculation: 433 R Axis:   88 Text Interpretation: Sinus tachycardia Otherwise normal ECG Confirmed by 11-30-1999 [phone]) on 01/05/2021 4:06:29 AM  I have reviewed the labs performed to date as well as medications administered while in observation.  Recent changes in the last 24 hours include I was asked to assess the patient due to concern noted by nursing staff for dental pain.  The patient underwent speech study earlier today due to persistent drooling, apparent dental pain while eating, difficulty swallowing.  Patient was observed to have no tongue elevation, no evidence of odontogenic infection with no evidence of periapical infection, gums lines appear to be inflamed with evidence of gingivitis, poor dentition was noted.  No trismus. Good range of motion of the neck with low concern for deep space infection. Will plan to continue Peridex mouthwash TID and Tylenol and Motrin for pain control.     Plan    Paul Hendricks is not under involuntary commitment.        Paul Pastures, MD 02/10/21 2321

## 2021-02-10 NOTE — Progress Notes (Signed)
CSW spoke with Gevena Barre from [name] and the inspection has not been done on the home patient will be discharging too. Inspection is supposed to be completed today. Its unknown at this time when patient can discharge from the ED.

## 2021-02-10 NOTE — ED Notes (Signed)
Pt very agitated and continues to try to come out of room and run out doors. Pt has pulled all blankets off bed and placed them on floor and has been laying on floor moaning. PRN meds given per MAR.

## 2021-02-10 NOTE — ED Provider Notes (Signed)
Emergency Medicine Observation Re-evaluation Note  Paul Hendricks is a 30 y.o. male, seen on rounds today.  Pt initially presented to the ED for complaints of No chief complaint on file. Currently, the patient is awaiting placement.  Physical Exam  BP 126/83 (BP Location: Left Arm)   Pulse (!) 136   Temp 98.8 F (37.1 C) (Oral)   Resp 18   Ht 6\' 2"  (1.88 m)   Wt 91 kg   SpO2 97%   BMI 25.76 kg/m  Physical Exam General: Sleeping Cardiac: Extremities well perfused Lungs: Wheezing even and unlabored Psych: No agitation  ED Course / MDM  EKG:EKG Interpretation  Date/Time:  Monday January 05 2021 01:56:14 EDT Ventricular Rate:  124 PR Interval:  138 QRS Duration: 74 QT Interval:  302 QTC Calculation: 433 R Axis:   88 Text Interpretation: Sinus tachycardia Otherwise normal ECG Confirmed by 11-30-1999 [phone]) on 01/05/2021 4:06:29 AM  I have reviewed the labs performed to date as well as medications administered while in observation.  Recent changes in the last 24 hours include possible placement to [facility] but they are awaiting confirmation of patient's address.  Per nursing request, SLP swallow study was ordered.  He has apparently had a hard time chewing and swallowing for the past several weeks.  Plan  Current plan is for awaiting placement.  Elmore Hyslop is not under involuntary commitment.     [name], MD 02/10/21 [phone]

## 2021-02-10 NOTE — ED Notes (Addendum)
Pt began grunting and acting aggressively. This RN attempted to speak with the pt and pt began kicking and swinging his arms. PRN agitation meds given.

## 2021-02-10 NOTE — Evaluation (Signed)
SLP Cancellation Note  Patient Details Name: Kainalu Heggs MRN: [id number] DOB: Dec 10, 1990   Cancelled treatment:       Reason Eval/Treat Not Completed: Other (comment) (pt currently sleeping and did not awaken adequately for po intake, will attempt visit again at approx 1345-1400)  Rolena Infante, MS [facility] SLP Acute Rehab Services Office [phone] Pager [phone]   Chales Abrahams 02/10/2021, 12:08 PM

## 2021-02-10 NOTE — Progress Notes (Signed)
02/10/21 1525  General Information  Date of Onset 02/10/21  HPI Pt is a 30 yo male adm to ED after having had a seizure - per notes pt had refused his po medications.  PSH + Impacted teeth with abnormal position  - seen by Dr Jeanice Lim, oral surgeon,  Epilepsy, generalized, convulsive (HCC)  Severe intellectual disability  Autistic disorder  Acute constipation  Atopic dermatitis  Bipolar disorder  Hypertriglyceridemia    Acute appendicitis.  Pt has reportedly been having some problems with eating causing him to cough/gag - even trying to put his finger down his throat.  Also noted to have suspected dental pain - as technician reports he points to left upper tooth.  Drooling reported also to be new as of the last month.  Type of Study Bedside Swallow Evaluation  Previous Swallow Assessment none in chart  Diet Prior to this Study Regular  Temperature Spikes Noted No  Respiratory Status Room air  History of Recent Intubation No  Behavior/Cognition Doesn't follow directions  Oral Cavity Assessment Other (comment);Erythema (appears with some erythema -)  Oral Care Completed by SLP No  Oral Cavity - Dentition Adequate natural dentition  Vision Functional for self-feeding  Self-Feeding Abilities Able to feed self  Patient Positioning Upright in chair  Baseline Vocal Quality Normal (when pt yells out, voicing was clear - only heard at end of session)  Volitional Cough Cognitively unable to elicit  Volitional Swallow Unable to elicit  Pain Assessment  Pain Assessment No/denies pain  Oral Motor/Sensory Function  Overall Oral Motor/Sensory Function WFL (pt appears with mild lingual thrusting)  Ice Chips  Ice chips Impaired  Presentation Spoon  Oral Phase Impairments Reduced lingual movement/coordination;Reduced labial seal;Poor awareness of bolus  Oral Phase Functional Implications Oral residue;Oral holding;Prolonged oral transit  Thin Liquid  Thin Liquid WFL  Presentation Cup  Nectar Thick  Liquid  Nectar Thick Liquid NT  Honey Thick Liquid  Honey Thick Liquid NT  Puree  Puree WFL  Solid  Solid   SLP - End of Session  Patient left in bed;with family/visitor present  Nurse Communication Aspiration precautions reviewed;Swallow strategies reviewed;Diet recommendation;Other (comment) (referral to dentist advised)  SLP Assessment  Clinical Impression Statement (ACUTE ONLY) SLP conducted meal observation to assess swallow function.  Patient did not follow directions for cranial nerve exam but is able to seal lips on spoon, etc.  Slight lingual thrusting noted with swallowing -likely due to autism but without anterior loss of boluses with small boluses.  He is impulsive and takes large bites/sips without orally clearing boluses first.  SLP facilitated safety with total assist/cues (holding other utensil to scrape off portion of large bolus which patient applied to his utensil; closing lid on his Styrofoam meal container, etc. He tolerated this intervention well today but his may be unpredictable given his Bipolar and Autism.     Patient consumed approx. 3 bites of mashed potatoes, approx. 5 bites of roast beef, entire [place] ice and entire bottle of Gatorade.    Approximately  of way through meal, SLP observed patient without offering safety intervention.  Left of his own accord, patient eats very rapidly and takes large bolus sizes.   At one point, he extended his head upward - demonstrating lingual thrusting with large bolus of Italian ice retained his oral cavity - concerning for aspiration risk.  SLP distracted pt to look down to maximize airway protection - after which he was able to [facility] and clear.  No oral  pocketing noted following meal.   Pt with no indication of aspiration, gagging, coughing or pointing to pharynx during meal.      Drooling noted prior to, during and after po intake - ? if exacerbated due to potential dental pain or if some baseline.  NT reports pt  without drooling until approximately one month ago.    Pt would benefit from supervision to help control his rate and bolus sizes.       Pt may benefit from consideration for using small utensils to control bolus size, etc.  Based on clinical evaluation, patient presents with minimal oral dysphagia but compensation strategies including taking small boluses with rate control can help maximize airway protection.    Given potential dental pain - upper left (? Premolar?), mastication ability may be negatively affected elevating patient's aspiration risk.  Recommend consider dental consult. SLP shared recommendations with RN, Administrator, arts and NT.   Thanks for this referral.  SLP Visit Diagnosis Dysphagia, oral phase (R13.11)  Impact on safety and function Moderate aspiration risk  Other Related Risk Factors Cognitive impairment;Decreased management of secretions  Swallow Evaluation Recommendations  Recommended Consults  (dentist)  SLP Diet Recommendations  (order softer foods for pt)  Liquid Administration via Cup;Other (Comment) (original container)  Medication Administration Other (Comment) (as tolerated)  Supervision Full supervision/cueing for compensatory strategies;Patient able to self feed  Compensations Slow rate;Small sips/bites  Postural Changes Remain upright for at least 30 minutes after po intake;Seated upright at 90 degrees  SLP Evaluations  $ SLP Speech Visit 1 Visit  SLP Evaluations  $BSS Swallow 1 Procedure  $Swallowing Treatment 1 Procedure  Rolena Infante, MS [facility] SLP Acute Rehab Services Office [phone] Pager [phone]

## 2021-02-10 NOTE — Progress Notes (Signed)
CM called patient's care coordinator [name] [name] to follow up on placement arrangements, VM mail left, awaiting call back.

## 2021-02-10 NOTE — ED Notes (Signed)
Pt woke up and began banging on the glass of his door. Pt re-directed to bed. Pt is calm and resting at this time.

## 2021-02-11 DIAGNOSIS — G40909 Epilepsy, unspecified, not intractable, without status epilepticus: Secondary | ICD-10-CM | POA: Diagnosis not present

## 2021-02-11 MED ORDER — PENICILLIN V POTASSIUM 500 MG PO TABS
500.0000 mg | ORAL_TABLET | Freq: Four times a day (QID) | ORAL | Status: AC
Start: 2021-02-11 — End: 2021-02-16
  Administered 2021-02-11 – 2021-02-15 (×15): 500 mg via ORAL
  Filled 2021-02-11 (×14): qty 1

## 2021-02-11 NOTE — ED Notes (Signed)
Pt 6am med will be late due to pt finally sleeping. Pt as been many times throughout the night agitated. Pt has been hard to redirect at times.

## 2021-02-11 NOTE — ED Provider Notes (Signed)
Nurse approached EDP adamant that patient needs pills for dental issues as he is not swallowing liquid.  This was changed to pills at nurse request    Nicanor Alcon, Fronie Holstein, MD 02/11/21 0700

## 2021-02-11 NOTE — ED Notes (Signed)
Pt has been up most of the night yelling and hitting the walls and attempting to run out of TCU. PRN meds given per MAR.

## 2021-02-12 DIAGNOSIS — G40909 Epilepsy, unspecified, not intractable, without status epilepticus: Secondary | ICD-10-CM | POA: Diagnosis not present

## 2021-02-12 NOTE — Progress Notes (Signed)
CSW spoke with Paul Hendricks from [name]. The home inspection was not completed. Mr. Paul Hendricks stated a message was sent out to the team and patient will not be able to discharge until the first week of October. The home inspection is supposed to be completed sometime this week or next week.

## 2021-02-12 NOTE — ED Notes (Signed)
Pt was provided a breakfast sandwich but began choking after a large bite. Pt able to clear airway and vomit/spit food into trash can. Pt agitated afterwards, leaving room multiple times and needing to be redirected. Pt appeared to complain of mouth pain. PRN pain and anxiety medications administered (see MAR). Provider informed about episode and asked about soft mechanical diet.

## 2021-02-13 DIAGNOSIS — G40909 Epilepsy, unspecified, not intractable, without status epilepticus: Secondary | ICD-10-CM | POA: Diagnosis not present

## 2021-02-13 NOTE — ED Provider Notes (Signed)
Emergency Medicine Observation Re-evaluation Note  Paul Hendricks is a 30 y.o. male, seen on rounds today.  Pt initially presented to the ED for complaints of No chief complaint on file. Currently, the patient is waiting for placement.  Physical Exam  BP (!) 141/102 (BP Location: Left Wrist) Comment (BP Location): holding him down from moving  Pulse (!) 130   Temp 98.8 F (37.1 C) (Oral)   Resp 18   Ht 1.88 m (6\' 2" )   Wt 91 kg   SpO2 100%   BMI 25.76 kg/m  Physical Exam General: Calm, resting Cardiac: Deferred Lungs: Deferred Psych: Deferred  ED Course / MDM  EKG:EKG Interpretation  Date/Time:  Monday January 05 2021 01:56:14 EDT Ventricular Rate:  124 PR Interval:  138 QRS Duration: 74 QT Interval:  302 QTC Calculation: 433 R Axis:   88 Text Interpretation: Sinus tachycardia Otherwise normal ECG Confirmed by 11-30-1999 [phone]) on 01/05/2021 4:06:29 AM  I have reviewed the labs performed to date as well as medications administered while in observation.  Recent changes in the last 24 hours include intermittent moaning.  Patient is receiving medications for gingivitis.  According to social work note yesterday discharge is not anticipated until the beginning of October.  Plan  Current plan is for group home.  Paul Hendricks is not under involuntary commitment.     [name], MD 02/13/21 [phone]

## 2021-02-13 NOTE — ED Notes (Signed)
Attempted to give pt meds in pudding and he took one bite and put it in trash can.

## 2021-02-13 NOTE — ED Notes (Signed)
Pt woke up agitated and moaning. Pt given fluids and food and orajel applied to teeth. Security at bedside to help calm him down.

## 2021-02-13 NOTE — ED Notes (Signed)
Report received from Shannon, RN

## 2021-02-14 DIAGNOSIS — G40909 Epilepsy, unspecified, not intractable, without status epilepticus: Secondary | ICD-10-CM | POA: Diagnosis not present

## 2021-02-14 NOTE — ED Notes (Signed)
Patient woke up and started to scream and banging his fist against glass window. Patient appears to be having increased restlessness at night.

## 2021-02-14 NOTE — ED Notes (Signed)
Pt was very aggressive  and had a bowel movement in the hall. Due to extreme agitation prn meds given per mar

## 2021-02-15 DIAGNOSIS — G40909 Epilepsy, unspecified, not intractable, without status epilepticus: Secondary | ICD-10-CM | POA: Diagnosis not present

## 2021-02-15 NOTE — ED Notes (Signed)
Patient awakened in a frantic state and ran off the unit.  Security successfully redirected patient back to room .

## 2021-02-15 NOTE — ED Provider Notes (Signed)
Emergency Medicine Observation Re-evaluation Note  Paul Hendricks is a 30 y.o. male, seen on rounds today.  Pt initially presented to the ED for complaints of sent from group home for aggression and seizure with H/O autism, nonverbal at baseline. Currently, the patient is resting quietly.  Physical Exam  BP (!) 142/86 (BP Location: Left Arm)   Pulse 78   Temp 98.4 F (36.9 C) (Oral)   Resp 18   Ht 6\' 2"  (1.88 m)   Wt 91 kg   SpO2 99%   BMI 25.76 kg/m  Physical Exam General: resting quietly. No respiratory distress   ED Course / MDM  EKG:EKG Interpretation  Date/Time:  Monday January 05 2021 01:56:14 EDT Ventricular Rate:  124 PR Interval:  138 QRS Duration: 74 QT Interval:  302 QTC Calculation: 433 R Axis:   88 Text Interpretation: Sinus tachycardia Otherwise normal ECG Confirmed by 11-30-1999 [id number]) on 01/05/2021 4:06:29 AM  I have reviewed the labs performed to date as well as medications administered while in observation.  Recent changes in the last 24 hours include none.  Plan  Current plan is for placement.  Paul Hendricks is not under involuntary commitment.     [name], MD 02/15/21 [phone]

## 2021-02-15 NOTE — ED Notes (Signed)
Pt is currently calm and eating dinner at this time.

## 2021-02-15 NOTE — ED Notes (Signed)
Pt yelling and biting at hand. Pt asked to return to his room. Pt went in room and came back out continuing to yell and bite on his hand. PRN meds given

## 2021-02-16 DIAGNOSIS — G40909 Epilepsy, unspecified, not intractable, without status epilepticus: Secondary | ICD-10-CM | POA: Diagnosis not present

## 2021-02-16 NOTE — ED Notes (Signed)
Pt ambulatory this shift.

## 2021-02-16 NOTE — ED Provider Notes (Signed)
Emergency Medicine Observation Re-evaluation Note  Paul Hendricks is a 30 y.o. male, seen on rounds today.  Pt initially presented to the ED for complaints of  behavioral symptoms, and now needing new group home placement.   Physical Exam  BP (!) 142/86 (BP Location: Left Arm)   Pulse 78   Temp 98.4 F (36.9 C) (Oral)   Resp 18   Ht 1.88 m (6\' 2" )   Wt 91 kg   SpO2 99%   BMI 25.76 kg/m  Physical Exam General: resting, calm.  Cardiac: regular rate.  Lungs: breathing comfortably Psych: calm, resting. Cooperative.   ED Course / MDM    I have reviewed the labs performed to date as well as medications administered while in observation.  Recent changes in the last 24 hours include ED observation, reassessment, stabilization on meds, and group home placement.   Plan    Chayanne Speir is not under involuntary commitment.  TOC team working with guardian and LME to facilitate placement.      [name], MD 02/16/21 [phone]

## 2021-02-16 NOTE — ED Notes (Signed)
Pt has been alert this shift. Pt has eaten meals and snacks. Pt took medication. Pt no s/s of distress.

## 2021-02-16 NOTE — ED Notes (Signed)
Pt eat 50% of his food

## 2021-02-17 DIAGNOSIS — G40909 Epilepsy, unspecified, not intractable, without status epilepticus: Secondary | ICD-10-CM | POA: Diagnosis not present

## 2021-02-17 NOTE — ED Notes (Signed)
Pt becoming agitated and yelling , meds given per [facility]

## 2021-02-17 NOTE — ED Notes (Signed)
Pt is not getting medications even when crushed and put in food. Pt will put it in the trash.

## 2021-02-17 NOTE — ED Notes (Signed)
Pt washed up at bedside, given clean pants and shirt. Pt fighting with staff, kicking, scratching, and attempting to bite.

## 2021-02-17 NOTE — ED Provider Notes (Signed)
Emergency Medicine Observation Re-evaluation Note  Paul Hendricks is a 30 y.o. male, seen on rounds today.  Pt initially presented to the ED for complaints of No chief complaint on file. Currently, the patient is resting quietly.  Physical Exam  BP (!) 142/86 (BP Location: Left Arm)   Pulse 78   Temp 98.4 F (36.9 C) (Oral)   Resp 18   Ht 6\' 2"  (1.88 m)   Wt 91 kg   SpO2 99%   BMI 25.76 kg/m  Physical Exam General: No acute distress Cardiac: Well-perfused Lungs: Nonlabored Psych: Calm  ED Course / MDM  EKG:EKG Interpretation  Date/Time:  Monday January 05 2021 01:56:14 EDT Ventricular Rate:  124 PR Interval:  138 QRS Duration: 74 QT Interval:  302 QTC Calculation: 433 R Axis:   88 Text Interpretation: Sinus tachycardia Otherwise normal ECG Confirmed by 11-30-1999 [id number]) on 01/05/2021 4:06:29 AM  I have reviewed the labs performed to date as well as medications administered while in observation.  Recent changes in the last 24 hours include social work working on group home placement.  Plan  Current plan is for group home placement.  Paul Hendricks is not under involuntary commitment.     Paul Pastures, MD 02/17/21 [phone]

## 2021-02-17 NOTE — ED Notes (Addendum)
Patient had multiple episodes of physical aggression toward staff this shift.

## 2021-02-17 NOTE — Progress Notes (Signed)
CSW received email from [facility] CC [name] [name] regarding placement.   This message was sent securely using [employer]    *Caution - External email - see footer for warnings* Great evening Team Paul Hendricks; I am forwarding this email I received from Paul Hendricks regarding the process that must be taken before Paul Hendricks can move into [employer] [name] under [employer] as the Provider.   Paul Hendricks, can you please complete the [employer] Site Specific Request for [employer] or [address] asap then send it to [place] and myself so I can complete my portion of the Site Specific Request for [employer].  For Paul Hendricks's [name] to be approved, we must receive permission from [facility], which only meet once a week. The next meeting for [facility] Board to review Client Specific Request is on Friday, 02/20/21). The Health and Safety Inspection will have to take place right after the approval is received because Health and Safety Inspections are not performed on the weekend. Therefore, [employer] and Safety Inspection will have to take place next week (which will be after 02/21/21).  I know that this is pushing it but we must have everything in place by the end of the day on Friday so Paul Hendricks can be discharged from [facility] ER on Saturday, 02/21/21.   I can not stress how urgent things have to be completed, submitted and approved before Paul Hendricks can mover into his [name] with [name].  I spoke with Paul Hendricks on Friday regarding Paul Hendricks's options.  She stated       there is a possibility for Paul Hendricks to still move into Marvin's current [name], under [name] since it has already received approval on Client Specific Request and passed the Health and Facilities manager    I have spoken with Mr. Paul Hendricks with [employer], [facility], received consent from Paul Hendricks's guardian to allow Paul Hendricks's information to be emailed to Mr. Paul Hendricks.  Mr. Paul Hendricks indicated he would have  to speak with his team before he could give me an answer.  I plan to contact Mr. Paul Hendricks, first thing Monday morning.  These are two options we are working with while waiting for a vacancy to become available for [place] and he comes off the waiting list at [facility]. As soon as I have more information regarding Paul Hendricks's transition from [employer] [facility] to [employer], I will inform Team [place].   If any member of Team [place] has any ideas or options for Paul Hendricks's urgent situation, please let me know asap.  Have a great evening and rest of your weekend. Thanks!  [name] [name], QP IDD Care Coordinator, [facility] LME-M[facility] [address] [place], [place]  [address] [phone] (office) Tuesday and Thursday (in the office) [phone] (cell)  Monday, Wednesday, and Friday (working from home) [phone] (fax)

## 2021-02-17 NOTE — ED Notes (Signed)
Pt kicking, screaming, and scratching at staff. Pt irritable and trying to bite as well.

## 2021-02-18 DIAGNOSIS — G40909 Epilepsy, unspecified, not intractable, without status epilepticus: Secondary | ICD-10-CM | POA: Diagnosis not present

## 2021-02-18 NOTE — ED Notes (Signed)
Pt took some medications in food, threw away some food items.

## 2021-02-19 DIAGNOSIS — G40909 Epilepsy, unspecified, not intractable, without status epilepticus: Secondary | ICD-10-CM | POA: Diagnosis not present

## 2021-02-19 NOTE — Progress Notes (Signed)
TOC CSW received the following email from [name] [name]/[facility] 02/19/2021.   This message was sent securely using [employer]  *Caution - External email - see footer for warnings*  Great morning Ms. Wilkerson;  I just spoke to Mr. Jarold Motto with [name] to verify that the client currently living at the [employer]. address will be moving out on 02/21/21.  I asked him to contact his Credential Specialist asap to have the site removed from his contract so that the Health and Facilities manager can be schedule for [name] at the [employer].  When I hear from [facility] and Safety regarding the scheduled inspection, I will let you all know. It appears that the transfer from [facility] to Marvin[facility] [name] placement with [name] is still on but the discharge date cannot happen until the site is removed from [name] and placed under [name]. I am copying this email to Team [place] to give them an update on [facility]'s transition.  Have a great day!  Thanks!  [name] [name], QP IDD Care Coordinator, [facility] LME-M[facility] [address] [place], [place]  [address] [phone] (office) Tuesday and Thursday (in the office) [phone] (cell)  Monday, Wednesday, and Friday (working from home)  [phone] (fax)  ________________________________________________ From: Kaylyn Lim [email or web address]> Sent: Thursday, February 19, 2021 10:20 AM To: [email or web address]; Soyla Dryer [email or web address]>; [name], [name] [email or web address]>; Randa [email or web address]> Subject: Health and Safety     Good morning, are we able to schedule the health and safety review for at [address], [place], [place], [address]  Leng Montesdeoca Tarpley-Carter, [name], LCSW-A Pronouns:  She/Her/Hers [facility]  [facility] Clinical Social Worker Direct Number:  [phone] [email or web address]

## 2021-02-20 DIAGNOSIS — G40909 Epilepsy, unspecified, not intractable, without status epilepticus: Secondary | ICD-10-CM | POA: Diagnosis not present

## 2021-02-20 LAB — CBC WITH DIFFERENTIAL/PLATELET
Abs Immature Granulocytes: 0.03 10*3/uL (ref 0.00–0.07)
Basophils Absolute: 0.1 10*3/uL (ref 0.0–0.1)
Basophils Relative: 1 %
Eosinophils Absolute: 1.1 10*3/uL — ABNORMAL HIGH (ref 0.0–0.5)
Eosinophils Relative: 12 %
HCT: 36.2 % — ABNORMAL LOW (ref 39.0–52.0)
Hemoglobin: 12.4 g/dL — ABNORMAL LOW (ref 13.0–17.0)
Immature Granulocytes: 0 %
Lymphocytes Relative: 41 %
Lymphs Abs: 3.5 10*3/uL (ref 0.7–4.0)
MCH: 31.6 pg (ref 26.0–34.0)
MCHC: 34.3 g/dL (ref 30.0–36.0)
MCV: 92.3 fL (ref 80.0–100.0)
Monocytes Absolute: 0.9 10*3/uL (ref 0.1–1.0)
Monocytes Relative: 11 %
Neutro Abs: 3 10*3/uL (ref 1.7–7.7)
Neutrophils Relative %: 35 %
Platelets: 200 10*3/uL (ref 150–400)
RBC: 3.92 MIL/uL — ABNORMAL LOW (ref 4.22–5.81)
RDW: 12.6 % (ref 11.5–15.5)
WBC: 8.6 10*3/uL (ref 4.0–10.5)
nRBC: 0 % (ref 0.0–0.2)

## 2021-02-20 LAB — COMPREHENSIVE METABOLIC PANEL
ALT: 108 U/L — ABNORMAL HIGH (ref 0–44)
AST: 145 U/L — ABNORMAL HIGH (ref 15–41)
Albumin: 3.4 g/dL — ABNORMAL LOW (ref 3.5–5.0)
Alkaline Phosphatase: 67 U/L (ref 38–126)
Anion gap: 5 (ref 5–15)
BUN: 17 mg/dL (ref 6–20)
CO2: 25 mmol/L (ref 22–32)
Calcium: 8.9 mg/dL (ref 8.9–10.3)
Chloride: 111 mmol/L (ref 98–111)
Creatinine, Ser: 0.97 mg/dL (ref 0.61–1.24)
GFR, Estimated: >60 mL/min (ref >60)
Glucose, Bld: 108 mg/dL — ABNORMAL HIGH (ref 70–99)
Potassium: 4.5 mmol/L (ref 3.5–5.1)
Sodium: 141 mmol/L (ref 135–145)
Total Bilirubin: 0.4 mg/dL (ref 0.3–1.2)
Total Protein: 6.5 g/dL (ref 6.5–8.1)

## 2021-02-20 LAB — VITAMIN D 25 HYDROXY (VIT D DEFICIENCY, FRACTURES): Vit D, 25-Hydroxy: 12.94 ng/mL — ABNORMAL LOW (ref 30–100)

## 2021-02-20 LAB — CK: Total CK: 1980 U/L — ABNORMAL HIGH (ref 49–397)

## 2021-02-20 MED ORDER — KETOROLAC TROMETHAMINE 30 MG/ML IJ SOLN
INTRAMUSCULAR | Status: AC
  Administered 2021-02-20: 30 mg via INTRAMUSCULAR
  Filled 2021-02-20: qty 1

## 2021-02-20 MED ORDER — KETOROLAC TROMETHAMINE 30 MG/ML IJ SOLN: 30.0000 mg | Freq: Once | INTRAMUSCULAR | Status: AC

## 2021-02-20 NOTE — ED Provider Notes (Signed)
Emergency Medicine Observation Re-evaluation Note  Lathaniel Legate is a 30 y.o. male, seen on rounds today.  Pt initially presented to the ED for complaints of No chief complaint on file. Currently, the patient is resting comfortably.  Physical Exam  BP (!) 142/86 (BP Location: Left Arm)   Pulse 78   Temp 98.4 F (36.9 C) (Oral)   Resp 18   Ht 1.88 m (6\' 2" )   Wt 91 kg   SpO2 99%   BMI 25.76 kg/m  Physical Exam General: Sleeping   ED Course / MDM  EKG:EKG Interpretation  Date/Time:  Monday January 05 2021 01:56:14 EDT Ventricular Rate:  124 PR Interval:  138 QRS Duration: 74 QT Interval:  302 QTC Calculation: 433 R Axis:   88 Text Interpretation: Sinus tachycardia Otherwise normal ECG Confirmed by 11-30-1999 [id number]) on 01/05/2021 4:06:29 AM  I have reviewed the labs performed to date as well as medications administered while in observation.  Recent changes in the last 24 hours include none.  Plan  Current plan is for placement.  Robson Trickey is not under involuntary commitment.     [name], MD 02/20/21 [phone]

## 2021-02-20 NOTE — ED Notes (Signed)
Pt appears is lethargic and somnolent. Six staff members held pt extremities while vitals were taken, and I drew his blood.

## 2021-02-20 NOTE — ED Notes (Signed)
Patient pointed to left cheek and stated, "It hurt."  MD notified.  Orders given.

## 2021-02-20 NOTE — ED Provider Notes (Signed)
  Physical Exam  BP (!) 141/100 (BP Location: Right Arm)   Pulse 94   Temp 98.4 F (36.9 C) (Oral)   Resp 20   Ht 6\' 2"  (1.88 m)   Wt 91 kg   SpO2 96%   BMI 25.76 kg/m   Physical Exam  ED Course/Procedures     Procedures  MDM  Patient's CK is mildly elevated at under 2000.  Will encourage oral fluids.  LFTs also mildly elevated.  We will stop his statin.  Likely will need recheck of the CK tomorrow.       , MD 02/20/21 [phone]

## 2021-02-21 DIAGNOSIS — Z79899 Other long term (current) drug therapy: Secondary | ICD-10-CM | POA: Diagnosis not present

## 2021-02-21 DIAGNOSIS — F840 Autistic disorder: Secondary | ICD-10-CM | POA: Diagnosis not present

## 2021-02-21 DIAGNOSIS — R569 Unspecified convulsions: Secondary | ICD-10-CM | POA: Diagnosis present

## 2021-02-21 DIAGNOSIS — Z20822 Contact with and (suspected) exposure to covid-19: Secondary | ICD-10-CM | POA: Diagnosis not present

## 2021-02-21 DIAGNOSIS — G40909 Epilepsy, unspecified, not intractable, without status epilepticus: Secondary | ICD-10-CM | POA: Diagnosis not present

## 2021-02-21 LAB — COMPREHENSIVE METABOLIC PANEL
ALT: 105 U/L — ABNORMAL HIGH (ref 0–44)
AST: 136 U/L — ABNORMAL HIGH (ref 15–41)
Albumin: 3.3 g/dL — ABNORMAL LOW (ref 3.5–5.0)
Alkaline Phosphatase: 58 U/L (ref 38–126)
Anion gap: 10 (ref 5–15)
BUN: 17 mg/dL (ref 6–20)
CO2: 23 mmol/L (ref 22–32)
Calcium: 9 mg/dL (ref 8.9–10.3)
Chloride: 108 mmol/L (ref 98–111)
Creatinine, Ser: 0.77 mg/dL (ref 0.61–1.24)
GFR, Estimated: >60 mL/min (ref >60)
Glucose, Bld: 103 mg/dL — ABNORMAL HIGH (ref 70–99)
Potassium: 4.2 mmol/L (ref 3.5–5.1)
Sodium: 141 mmol/L (ref 135–145)
Total Bilirubin: 0.8 mg/dL (ref 0.3–1.2)
Total Protein: 6.3 g/dL — ABNORMAL LOW (ref 6.5–8.1)

## 2021-02-21 LAB — CK: Total CK: 953 U/L — ABNORMAL HIGH (ref 49–397)

## 2021-02-21 LAB — VITAMIN D 25 HYDROXY (VIT D DEFICIENCY, FRACTURES): Vit D, 25-Hydroxy: 17.1 ng/mL — ABNORMAL LOW (ref 30–100)

## 2021-02-21 NOTE — ED Provider Notes (Addendum)
Emergency Medicine Observation Re-evaluation Note  Paul Hendricks is a 30 y.o. male, seen on rounds today.  Pt initially presented to the ED for complaints of No chief complaint on file. Currently, the patient is sleeping.  Physical Exam  BP (!) 141/100 (BP Location: Right Arm)   Pulse 94   Temp 98.4 F (36.9 C) (Oral)   Resp 20   Ht 6\' 2"  (1.88 m)   Wt 91 kg   SpO2 96%   BMI 25.76 kg/m  Physical Exam General: No distress, respirations nonlabored   ED Course / MDM  EKG:EKG Interpretation  Date/Time:  Monday January 05 2021 01:56:14 EDT Ventricular Rate:  124 PR Interval:  138 QRS Duration: 74 QT Interval:  302 QTC Calculation: 433 R Axis:   88 Text Interpretation: Sinus tachycardia Otherwise normal ECG Confirmed by 11-30-1999 [phone]) on 01/05/2021 4:06:29 AM  I have reviewed the labs performed to date as well as medications administered while in observation.  Recent changes in the last 24 hours include none.  Plan  Current plan is for group home placement.  Paul Hendricks is not under involuntary commitment. Labs were checked yesterday with mild CK elevation.  We will need to attempt a recheck today.  Addendum: CK has trended down to half of yesterday's value.  Mild elevation in LFTs has flattened and decreasing.  Suspect these findings are secondary to episodes of agitation and possible restraint.  Patient's kidney function is normal.  Vital signs are stable, safe to continue with oral hydration and intake.     Paul Pastures, MD 02/21/21 04/23/21    1829, MD 02/21/21 1233

## 2021-02-21 NOTE — ED Notes (Signed)
Patient beating on leg, pointing to the Left side of his mouth.

## 2021-02-21 NOTE — ED Notes (Signed)
Patient stomping on floor and moaning.  Patient drooling excessively.

## 2021-02-21 NOTE — ED Notes (Signed)
Pt took his evening med's and is resting quietly in his room.

## 2021-02-21 NOTE — ED Notes (Signed)
Pt become upset and agitated. Able to redirect PRN: Ativan 2 mg IM given( see MAR) Pt resting comfortably .  Sitter at bedside.

## 2021-02-22 DIAGNOSIS — G40909 Epilepsy, unspecified, not intractable, without status epilepticus: Secondary | ICD-10-CM | POA: Diagnosis not present

## 2021-02-22 NOTE — ED Notes (Addendum)
Pt woke abruptly yelling and ran out of room into hallway. Security and this tech redirected pt to room. Pt offered breakfast tray.

## 2021-02-22 NOTE — ED Notes (Addendum)
Pt refused most of lunch tray. Ate fruit cup, vanilla pudding, and a breakfast sandwich

## 2021-02-23 DIAGNOSIS — G40909 Epilepsy, unspecified, not intractable, without status epilepticus: Secondary | ICD-10-CM | POA: Diagnosis not present

## 2021-02-23 MED ORDER — KETOROLAC TROMETHAMINE 30 MG/ML IJ SOLN
30.0000 mg | Freq: Once | INTRAMUSCULAR | Status: AC
  Administered 2021-02-23: 30 mg via INTRAMUSCULAR
  Filled 2021-02-23: qty 1

## 2021-02-23 NOTE — Progress Notes (Signed)
CSW received e-mail from [employer] [facility] [name].    This message was sent securely using Zix    *Caution - External email - see footer for warnings* Great morning Mr. Paul Hendricks; Would you check with Paul Hendricks to confirm Paul Hendricks with [name] has terminated his site so that Mr. Paul Hendricks can request a Health and Facilities manager for [name].  Paul Hendricks's [name] can not be listed under 2 provider agencies.  As of this morning Paul Hendricks's [name] is still under [name]. We cannot move forward with the transition until this resolved by [name].  I am also copying Paul Hendricks with [name] to inform him that we cannot move forward until he contacts his Chartered loss adjuster with [name] to terminate Paul Hendricks's [name] with [name]. Once the termination is completed, Paul Hendricks and [name], after Paul Hendricks's [name] under [name] passes inspection, the transfer can be completed from [name] to [name].  Please let me know asap so I can submit the sars.  Have a great day! Thanks!  [name] [name], QP IDD Care Coordinator, [facility] LME-MCO [address] [place], [place]  [address] [phone] (office) Tuesday and Thursday (in the office) [phone] (cell)  Monday, Wednesday, and Friday (working from home) [phone] (fax)  From: [name] [email or web address]>  Sent: Monday, February 23, 2021 8:14 AM To: Randa [email or web address] [email or web address]>; [name], [name] [email or web address]>; Soyla [email or web address]>; [email or web address] Subject: Health & Safety  Good morning, are we able to schedule the health & safety today? The [name] staff is [name] at [name] [name] [address]. He told me his member have moved out. Please let me know, we are ready to move forward with  Paul Hendricks.  CONFIDENTIALITY NOTICE: This message and any attachments included are from [name] and are for sole use by the intended recipient(s). The information contained herein may include confidential or privileged information. Unauthorized review, forwarding, printing, copying, distributing, or using such information is strictly prohibited and may be unlawful. If you received this message in error, or have reason to believe you are not authorized to receive it, please contact the sender by reply email and destroy all copies of the original message.  Please be advised that any e-mail sent to and from this e-mail account is subject to the [place] Public Records Law and may be disclosed to third parties. *To report fraud, waste and abuse, call the Medicaid tip-line at1-877-DMA-TIP1 (725-470-6966). Your call will remain anonymous.  WARNING: This email originated outside of [place]. Even if this looks like a FedEx, it is not. Do not provide your username, password, or any other personal information in response to this or any other email. [place] will never ask you for your username or password via email. DO NOT CLICK links or attachments unless you are positive the content is safe. If in doubt about the safety of this message, select the [employer] Report Phishing button, which forwards to IT Security.     This message was secured by Zix.

## 2021-02-23 NOTE — ED Notes (Signed)
Patient beating on glass.  Prior to beating on glass patient pointed to L side of mouth and said "It hurt."  Patient refusing PO medications.  MD notified.

## 2021-02-23 NOTE — ED Provider Notes (Signed)
Emergency Medicine Observation Re-evaluation Note  Paul Hendricks is a 30 y.o. male, seen on rounds today at 0730.  Pt initially presented to the ED for complaints of No chief complaint on file. Currently, the patient is resting comfortably.  Physical Exam  BP (!) 141/100 (BP Location: Right Arm)   Pulse 94   Temp 98.4 F (36.9 C) (Oral)   Resp 20   Ht 6\' 2"  (1.88 m)   Wt 91 kg   SpO2 96%   BMI 25.76 kg/m  Physical Exam General: NAD   ED Course / MDM  EKG:EKG Interpretation  Date/Time:  Monday January 05 2021 01:56:14 EDT Ventricular Rate:  124 PR Interval:  138 QRS Duration: 74 QT Interval:  302 QTC Calculation: 433 R Axis:   88 Text Interpretation: Sinus tachycardia Otherwise normal ECG Confirmed by 11-30-1999 [phone]) on 01/05/2021 4:06:29 AM  I have reviewed the labs performed to date as well as medications administered while in observation.  Recent changes in the last 24 hours include no acute events reported by staff.  Plan  Current plan is for group home placement.  Paul Hendricks is not under involuntary commitment.     [name], MD 02/23/21 1021

## 2021-02-24 DIAGNOSIS — G40909 Epilepsy, unspecified, not intractable, without status epilepticus: Secondary | ICD-10-CM | POA: Diagnosis not present

## 2021-02-24 MED ORDER — KETOROLAC TROMETHAMINE 30 MG/ML IJ SOLN
30.0000 mg | Freq: Once | INTRAMUSCULAR | Status: DC
  Filled 2021-02-24: qty 1

## 2021-02-24 MED ORDER — KETOROLAC TROMETHAMINE 60 MG/2ML IM SOLN
30.0000 mg | Freq: Once | INTRAMUSCULAR | Status: AC
  Administered 2021-02-24: 30 mg via INTRAMUSCULAR

## 2021-02-24 NOTE — Progress Notes (Signed)
CSW received Email from [employer] [facility] [place].  This message was sent securely using Zix    *Caution - External email - see footer for warnings* Great evening Everyone; I spoke with Felicia in [facility] and she stated that once the Health and Safety is approved and confirmation is received, I will be able to submit the SARS.  The effective date is still 02/23/21.  Jon Gills is going to check with [facility] Monitoring department to make sure of the Health and Safety Inspection has been approved.  Once she has verified the inspection has been approved then she will contact me, and I will submit the SARS.  The effective date for services is 02/23/21.   I must correct the discharge date on the ISP Update for [name], send out the ISP Update with the correct date, get new signature pages then submit corrected ISP Update to [facility].   I will be working on the corrections tonight then send out the corrected ISP Update tonight or first thing tomorrow morning.  After I receive a call/email from [employer] indicating the Health and Facilities manager was approved, I will submit the SARS. Hopefully, I will hear from [place] in the morning. I am in a JIVA training all day tomorrow so I will not be able to submit the SARS until I my lunchbreak tomorrow.  The plan is to have [name] transferred from [facility] to [name] with [name] on Thursday, 02/26/21.  If I hear anything different, I will email the team and let you know.  We are about to cross the finish line and be victorious.  Have a great evening! Thanks!   [name] [name], QP IDD Care Coordinator, [facility] LME-MCO [address] [place], [place]  [address] [phone] (office) Tuesday and Thursday (in the office) [phone] (cell)  Monday, Wednesday, and Friday (working from home) [phone] (fax)   [name].Mahealani Sulak, [name], LCSWA [name]  Transitions of  Care Clinical Social Worker I Direct Dial: [phone]  Fax: [phone] [name]

## 2021-02-25 DIAGNOSIS — G40909 Epilepsy, unspecified, not intractable, without status epilepticus: Secondary | ICD-10-CM | POA: Diagnosis not present

## 2021-02-25 MED ORDER — KETOROLAC TROMETHAMINE 60 MG/2ML IM SOLN
30.0000 mg | Freq: Once | INTRAMUSCULAR | Status: AC
  Administered 2021-02-25: 30 mg via INTRAMUSCULAR
  Filled 2021-02-25: qty 2

## 2021-02-25 NOTE — ED Notes (Signed)
Pt became very agitated, security and RN redirected pt to room. Prn meds given per mar

## 2021-02-25 NOTE — ED Provider Notes (Signed)
Emergency Medicine Observation Re-evaluation Note  Paul Hendricks is a 30 y.o. male, seen on rounds today.  Pt initially presented to the ED for complaints of No chief complaint on file. Currently, the patient ihs no complait  Physical Exam  BP (!) 141/100 (BP Location: Right Arm)   Pulse 94   Temp 98.4 F (36.9 C) (Oral)   Resp 20   Ht 6\' 2"  (1.88 m)   Wt 91 kg   SpO2 96%   BMI 25.76 kg/m  Physical Exam  Patient alert oriented to person only.   ED Course / MDM  EKG:EKG Interpretation  Date/Time:  Monday January 05 2021 01:56:14 EDT Ventricular Rate:  124 PR Interval:  138 QRS Duration: 74 QT Interval:  302 QTC Calculation: 433 R Axis:   88 Text Interpretation: Sinus tachycardia Otherwise normal ECG Confirmed by 11-30-1999 [phone]) on 01/05/2021 4:06:29 AM  I have reviewed the labs performed to date as well as medications administered while in observation.    Plan  Current plan is for group home placement  Paul Hendricks is not under involuntary commitment.     [name], MD 02/25/21 [phone]

## 2021-02-26 DIAGNOSIS — G40909 Epilepsy, unspecified, not intractable, without status epilepticus: Secondary | ICD-10-CM | POA: Diagnosis not present

## 2021-02-26 NOTE — ED Notes (Signed)
Pt resting comfortably

## 2021-02-26 NOTE — Progress Notes (Signed)
CSW received an email from [employer] concerning placement.  This message was sent securely using Zix    *Caution - External email - see footer for warnings* Great afternoon Team [place]; I want to provide an update on the status of Paul Hendricks's transitional move from [facility] Room to [facility] [name].  I received an email from [facility] and Safety informing me that there a few things that must be completed before Paul Hendricks's [name] passes the Health and Facilities manager.   I spoke with Mr. Jearl Klinefelter with [name], this afternoon, to check on the status of when the corrections can be made, Mr. Jearl Klinefelter indicated he has spoken to [facility] [name] provider and provided him with the corrections needed for the [name] to pass inspection.  Mr. Jearl Klinefelter is just waiting to hear back from [facility] provider.  I attempted to contact Paul Hendricks's [name] provider to receive an approximate date and time the correction would be done but I was unable to speak with him.  I left a voicemail message for him to contact me asap.  As soon as I hear anything I will be sure to let Team Paul Hendricks know.  Have a great afternoon.     Thanks! [name] [name], QP IDD Care Coordinator, [facility] LME-MCO [address] [place], [place]  [address] [phone] (office) Tuesday and Thursday (in the office) [phone] (cell)  Monday, Wednesday, and Friday (working from home) [phone] (fax) CONFIDENTIALITY NOTICE: This message and any attachments included are from [facility] and are for sole use by the intended recipient(s). The information contained herein may include confidential or privileged information. Unauthorized review, forwarding, printing, copying, distributing, or using such information is strictly prohibited and may be unlawful. If you received this message in error, or have reason to believe you are not authorized to receive it, please contact the sender by reply email and destroy all  copies of the original message. ** Please be advised that any e-mail sent to and from this e-mail account is subject to the [facility] Public Records Law and may be disclosed to third parties. ** To report fraud, waste and abuse, call the Medicaid tip-line at1-877-DMA-TIP1 (4797274510). Your call will remain anonymous.  WARNING: This email originated outside of [facility]. Even if this looks like a FedEx, it is not. Do not provide your username, password, or any other personal information in response to this or any other email. [employer] will never ask you for your username or password via email. DO NOT CLICK links or attachments unless you are positive the content is safe. If in doubt about the safety of this message, select the [employer] Report Phishing button, which forwards to IT Security.   This message was secured by Zix.   [name].Cherrelle Plante, [name], LCSWA [name]  Transitions of Care Clinical Social Worker I Direct Dial: [phone]  Fax: [phone] [name]

## 2021-02-26 NOTE — ED Provider Notes (Signed)
Emergency Medicine Observation Re-evaluation Note  Asah Lamay is a 30 y.o. male, seen on rounds today.  Pt initially presented to the ED for complaints of No chief complaint on file. Currently, the patient is resting.  Physical Exam  BP (!) 141/100 (BP Location: Right Arm)   Pulse 94   Temp 98.4 F (36.9 C) (Oral)   Resp 20   Ht 6\' 2"  (1.88 m)   Wt 91 kg   SpO2 96%   BMI 25.76 kg/m  Physical Exam General: No acute distress Cardiac: Well-perfused Lungs: Nonlabored Psych: Cooperative  ED Course / MDM  EKG:EKG Interpretation  Date/Time:  Monday January 05 2021 01:56:14 EDT Ventricular Rate:  124 PR Interval:  138 QRS Duration: 74 QT Interval:  302 QTC Calculation: 433 R Axis:   88 Text Interpretation: Sinus tachycardia Otherwise normal ECG Confirmed by 11-30-1999 [phone]) on 01/05/2021 4:06:29 AM  I have reviewed the labs performed to date as well as medications administered while in observation.  Recent changes in the last 24 hours include social work continuing to work on new group home.  Plan  Current plan is for group home placement.  Christy Friede is not under involuntary commitment.     [name], MD 02/26/21 04/28/21

## 2021-02-27 DIAGNOSIS — G40909 Epilepsy, unspecified, not intractable, without status epilepticus: Secondary | ICD-10-CM | POA: Diagnosis not present

## 2021-02-27 LAB — COMPREHENSIVE METABOLIC PANEL
ALT: 115 U/L — ABNORMAL HIGH (ref 0–44)
AST: 158 U/L — ABNORMAL HIGH (ref 15–41)
Albumin: 3.6 g/dL (ref 3.5–5.0)
Alkaline Phosphatase: 66 U/L (ref 38–126)
Anion gap: 8 (ref 5–15)
BUN: 14 mg/dL (ref 6–20)
CO2: 25 mmol/L (ref 22–32)
Calcium: 9.1 mg/dL (ref 8.9–10.3)
Chloride: 111 mmol/L (ref 98–111)
Creatinine, Ser: 0.83 mg/dL (ref 0.61–1.24)
GFR, Estimated: >60 mL/min (ref >60)
Glucose, Bld: 123 mg/dL — ABNORMAL HIGH (ref 70–99)
Potassium: 3.9 mmol/L (ref 3.5–5.1)
Sodium: 144 mmol/L (ref 135–145)
Total Bilirubin: 0.6 mg/dL (ref 0.3–1.2)
Total Protein: 6.5 g/dL (ref 6.5–8.1)

## 2021-02-27 LAB — VALPROIC ACID LEVEL: Valproic Acid Lvl: 35 ug/mL — ABNORMAL LOW (ref 50.0–100.0)

## 2021-02-27 LAB — VITAMIN D 25 HYDROXY (VIT D DEFICIENCY, FRACTURES): Vit D, 25-Hydroxy: 17.04 ng/mL — ABNORMAL LOW (ref 30–100)

## 2021-02-27 MED ORDER — VITAMIN D 25 MCG (1000 UNIT) PO TABS
1000.0000 [IU] | ORAL_TABLET | Freq: Every day | ORAL | Status: DC
  Administered 2021-02-27 – 2021-05-11 (×58): 1000 [IU] via ORAL
  Filled 2021-02-27 (×71): qty 1

## 2021-02-27 NOTE — ED Notes (Signed)
Pt becoming very agitated meds given per mar 

## 2021-02-27 NOTE — ED Notes (Addendum)
Patient refused all PO medications on day shift and night shift. MD aware. He has become more selective in his food choices which is making it difficult to administer medication.

## 2021-02-27 NOTE — ED Provider Notes (Addendum)
Emergency Medicine Observation Re-evaluation Note  Paul Hendricks is a 30 y.o. male, seen on rounds today.  Pt initially presented to the ED for complaints of seizure and aggression;  currently, the patient is resting in bed, he has taken his morning medicines along with some food.  Physical Exam  BP (!) 146/79   Pulse 80   Temp 98.4 F (36.9 C) (Oral)   Resp 20   Ht 6\' 2"  (1.88 m)   Wt 91 kg   SpO2 100%   BMI 25.76 kg/m  Physical Exam General: Nontoxic appearance Cardiac: Normal heart rate Lungs: Normal respiratory Psych: Occasional vocalizations, not intelligible.  ED Course / MDM  EKG:EKG Interpretation  Date/Time:  Monday January 05 2021 01:56:14 EDT Ventricular Rate:  124 PR Interval:  138 QRS Duration: 74 QT Interval:  302 QTC Calculation: 433 R Axis:   88 Text Interpretation: Sinus tachycardia Otherwise normal ECG Confirmed by 11-30-1999 [phone]) on 01/05/2021 4:06:29 AM  I have reviewed the labs performed to date as well as medications administered while in observation.  Recent changes in the last 24 hours include it has been difficult for nursing to get his medications administered.  They have to hide the medicines and various types of food.  Recently his vitamin D level was checked, and was low.  It does not appear that medication for that has been ordered.  I asked the pharmacy to give a recommendation on dosing for vitamin D deficiency.  Plan  Current plan is for monitoring while awaiting placement in a group home.  Hazen Brumett is not under involuntary commitment.     [name], MD 02/27/21 1044  I have consulted with pharmacy, who have recommended a starting dose for daily treatment with vitamin D.  They recommend rechecking the level in 3 months.    04/29/21, MD 02/27/21 1149

## 2021-02-28 DIAGNOSIS — G40909 Epilepsy, unspecified, not intractable, without status epilepticus: Secondary | ICD-10-CM | POA: Diagnosis not present

## 2021-02-28 NOTE — ED Notes (Signed)
Pt is resting with equal chest rise and fall, audible even breathing

## 2021-02-28 NOTE — Progress Notes (Signed)
CSW reviewed chart.  Per Pt's care coordinator a potential [name] placement has been found, but some issues with the Health and Safety Inspection remain.  TOC team is waiting for follow up from Care Coordinator with final details.

## 2021-02-28 NOTE — ED Provider Notes (Signed)
Emergency Medicine Observation Re-evaluation Note  Paul Hendricks is a 30 y.o. male, seen on rounds today.  Pt initially presented to the ED for complaints of No chief complaint on file. Currently, the patient is resting.  Physical Exam  BP (!) 146/79   Pulse 80   Temp 98.4 F (36.9 C) (Oral)   Resp 20   Ht 6\' 2"  (1.88 m)   Wt 91 kg   SpO2 100%   BMI 25.76 kg/m  Physical Exam General: resting comfortably, NAD Lungs: normal WOB Psych: currently calm and resting  ED Course / MDM  EKG:EKG Interpretation  Date/Time:  Monday January 05 2021 01:56:14 EDT Ventricular Rate:  124 PR Interval:  138 QRS Duration: 74 QT Interval:  302 QTC Calculation: 433 R Axis:   88 Text Interpretation: Sinus tachycardia Otherwise normal ECG Confirmed by 11-30-1999 [phone]) on 01/05/2021 4:06:29 AM  I have reviewed the labs performed to date as well as medications administered while in observation.  Recent changes in the last 24 hours include none.  Plan  Current plan is for group home placement.  Paul Hendricks is not under involuntary commitment.     [name], Rozelle Logan 02/28/21 04/30/21

## 2021-02-28 NOTE — ED Notes (Addendum)
Pt awake and agitated, fighting sleep, offered fruit cups and his mashed potatoes.

## 2021-02-28 NOTE — ED Notes (Signed)
Pt now laying down, calming, no agitation noted

## 2021-02-28 NOTE — ED Notes (Signed)
Pt is up looking for food, gave fruit cup and gingerale, holding his jaw due to tooth pain, attempted to give tylenol in apple sauce with no luck, gave the haldol for agitation and swatting at staff

## 2021-02-28 NOTE — ED Notes (Signed)
Pt ran out of room trying to go out door, security and tech redirected pt, pt became aggressive. Prn meds given per [facility]

## 2021-03-01 DIAGNOSIS — G40909 Epilepsy, unspecified, not intractable, without status epilepticus: Secondary | ICD-10-CM | POA: Diagnosis not present

## 2021-03-01 NOTE — ED Provider Notes (Signed)
Emergency Medicine Observation Re-evaluation Note  Paul Hendricks is a 30 y.o. male, seen on rounds today.  Pt initially presented to the ED for complaints of No chief complaint on file. Currently, the patient is wandering around his room.  Physical Exam  BP (!) 146/79   Pulse 80   Temp 98.4 F (36.9 C) (Oral)   Resp 20   Ht 6\' 2"  (1.88 m)   Wt 91 kg   SpO2 100%   BMI 25.76 kg/m  Physical Exam General: Awake and alert Lungs: Resp even and unlabored Psych: Calm, cooperative  ED Course / MDM  EKG:EKG Interpretation  Date/Time:  Monday January 05 2021 01:56:14 EDT Ventricular Rate:  124 PR Interval:  138 QRS Duration: 74 QT Interval:  302 QTC Calculation: 433 R Axis:   88 Text Interpretation: Sinus tachycardia Otherwise normal ECG Confirmed by 11-30-1999 [phone]) on 01/05/2021 4:06:29 AM  I have reviewed the labs performed to date as well as medications administered while in observation.  Recent changes in the last 24 hours include none.  Plan  Current plan is for group home placement.  Paul Hendricks is not under involuntary commitment.     Paul Pastures, MD 03/01/21 [phone]

## 2021-03-01 NOTE — Progress Notes (Signed)
CSW received e-mail from CC [name] [name] concerning placement.    This message was sent securely using Zix    *Caution - External email - see footer for warnings* Great evening Team G. I have some disappointing news.  I just received a call from [facility] in [facility], informing me that [place] and Dr. Lenore Cordia have agreed that it's not in the best interest, at this time, to approve [facility] Client Specific Request with [name]' [name] for    Dale Kalaheo at  [address]  [place] [address]. It's unfortunate that we must begin the search process over for another Residential Level 4 [name]/Group Home for GM.  I will begin the search on Monday, 03/02/21 and submit the weekly 5-day ER Report starting on 03/03/21.  If anyone has any ideas/leads or suggestions for a possible residential placement for GM, please contact me at the numbers below. Have a great evening and weekend! Thanks!  [name] [name], QP IDD Care Coordinator, [facility] LME-MCO [address] [place], [place]  [address] [phone] (office) Tuesday and Thursday (in the office) [phone] (cell)  Monday, Wednesday, and Friday (working from home) [phone] (fax)  CONFIDENTIALITY NOTICE: This message and any attachments included are from [facility] and are for sole use by the intended recipient(s). The information contained herein may include confidential or privileged information. Unauthorized review, forwarding, printing, copying, distributing, or using such information is strictly prohibited and may be unlawful. If you received this message in error, or have reason to believe you are not authorized to receive it, please contact the sender by reply email and destroy all copies of the original message. *Please be advised that any e-mail sent to and from this e-mail account is subject to the [facility] Public Records Law and may be disclosed to third parties. *To  report fraud, waste and abuse, call the Medicaid tip-line at1-877-DMA-TIP1 ((602)718-1894). Your call will remain anonymous.  WARNING: This email originated outside of [facility]. Even if this looks like a FedEx, it is not. Do not provide your username, password, or any other personal information in response to this or any other email. [place] will never ask you for your username or password via email. DO NOT CLICK links or attachments unless you are positive the content is safe. If in doubt about the safety of this message, select the [employer] Report Phishing button, which forwards to IT Security.   This message was secured by [employer].   [name].Joneisha Miles, [name], LCSWA [name]  Transitions of Care Clinical Social Worker I Direct Dial: [phone]  Fax: [phone] [name]

## 2021-03-02 DIAGNOSIS — G40909 Epilepsy, unspecified, not intractable, without status epilepticus: Secondary | ICD-10-CM | POA: Diagnosis not present

## 2021-03-02 LAB — TOPIRAMATE LEVEL: Topiramate Lvl: 1.7 ug/mL — ABNORMAL LOW (ref 2.0–25.0)

## 2021-03-02 MED ORDER — KETOROLAC TROMETHAMINE 30 MG/ML IJ SOLN
15.0000 mg | Freq: Four times a day (QID) | INTRAMUSCULAR | Status: AC | PRN
  Administered 2021-03-02 – 2021-03-06 (×6): 15 mg via INTRAMUSCULAR
  Filled 2021-03-02 (×5): qty 1

## 2021-03-02 MED ORDER — KETOROLAC TROMETHAMINE 30 MG/ML IJ SOLN
15.0000 mg | Freq: Four times a day (QID) | INTRAMUSCULAR | Status: DC | PRN
  Filled 2021-03-02: qty 1

## 2021-03-02 NOTE — ED Notes (Signed)
Pt starting to scream and make loud noises. Pt is biting on hand.

## 2021-03-02 NOTE — ED Provider Notes (Signed)
Emergency Medicine Observation Re-evaluation Note  Paul Hendricks is a 30 y.o. male, seen on rounds today.  Pt initially presented to the ED for complaints of No chief complaint on file. Currently, the patient is sleeping.  Physical Exam  BP (!) 146/79   Pulse 80   Temp 98.4 F (36.9 C) (Oral)   Resp 20   Ht 6\' 2"  (1.88 m)   Wt 91 kg   SpO2 100%   BMI 25.76 kg/m  Physical Exam General: No distress Lungs: Resp even and unlabored Psych: Sleeping  ED Course / MDM  EKG:EKG Interpretation  Date/Time:  Monday January 05 2021 01:56:14 EDT Ventricular Rate:  124 PR Interval:  138 QRS Duration: 74 QT Interval:  302 QTC Calculation: 433 R Axis:   88 Text Interpretation: Sinus tachycardia Otherwise normal ECG Confirmed by 11-30-1999 [phone]) on 01/05/2021 4:06:29 AM  I have reviewed the labs performed to date as well as medications administered while in observation.  Recent changes in the last 24 hours include none.  Plan  Current plan is for group home placement.  Paul Hendricks is not under involuntary commitment.     [name], MD 03/02/21 [phone]

## 2021-03-02 NOTE — ED Notes (Signed)
Throughout the shift pts behavior changed from calm and redirectable to erratic. Pt is currently sleeping at this time. Pt does need help with ADLs.

## 2021-03-02 NOTE — ED Notes (Signed)
Pt was unable to get all AM down due to vomiting.

## 2021-03-02 NOTE — ED Notes (Signed)
Pt is very agitated at this time. Pt continuing to scream out loud and bit his hand.

## 2021-03-03 DIAGNOSIS — G40909 Epilepsy, unspecified, not intractable, without status epilepticus: Secondary | ICD-10-CM | POA: Diagnosis not present

## 2021-03-03 MED ORDER — ZIPRASIDONE MESYLATE 20 MG IM SOLR
20.0000 mg | Freq: Once | INTRAMUSCULAR | Status: AC
  Administered 2021-03-03: 20 mg via INTRAMUSCULAR
  Filled 2021-03-03: qty 20

## 2021-03-03 NOTE — ED Provider Notes (Signed)
Emergency Medicine Observation Re-evaluation Note  Paul Hendricks is a 30 y.o. male, seen on rounds today.  Pt initially presented to the ED for complaints of No chief complaint on file. Currently, the patient is sleeping.  Physical Exam  BP (!) 146/79   Pulse 80   Temp 98.4 F (36.9 C) (Oral)   Resp 20   Ht 1.88 m (6\' 2" )   Wt 91 kg   SpO2 100%   BMI 25.76 kg/m  Physical Exam General: No acute distress Cardiac:  Lungs: No respiratory distress Psych: Baseline  ED Course / MDM  EKG:EKG Interpretation  Date/Time:  Monday January 05 2021 01:56:14 EDT Ventricular Rate:  124 PR Interval:  138 QRS Duration: 74 QT Interval:  302 QTC Calculation: 433 R Axis:   88 Text Interpretation: Sinus tachycardia Otherwise normal ECG Confirmed by 11-30-1999 [phone]) on 01/05/2021 4:06:29 AM  I have reviewed the labs performed to date as well as medications administered while in observation.  Recent changes in the last 24 hours include no significant changes.  Plan  Current plan is for group home placement.  Paul Hendricks is not under involuntary commitment.     Paul Pastures, MD 03/03/21 1057

## 2021-03-03 NOTE — ED Notes (Signed)
Pt is continuing to scream and bite hand at this time. Security at bedside.

## 2021-03-03 NOTE — ED Notes (Addendum)
Throughout shift pt had many erratic behaviors. Pt is known for having medications being mixed in with food to take however pt is not taking his meds along with the food it is being put in which may be the cause of the erratic behaviors that presented today. MD is aware about this. Pt is currently asleep in bed. Pt does need assistance with ADLs. Pt does move from side to side in bed periodically. Pt is in no distress at this time. Chest rises and falls symmetrically.

## 2021-03-03 NOTE — ED Notes (Signed)
Pt has returned to his room. Pt is currently laying in his bed.

## 2021-03-03 NOTE — ED Notes (Signed)
Pt is asleep in bed. Pt does move from side to side periodically while laying in bed.

## 2021-03-03 NOTE — ED Notes (Signed)
Pt continuing to scream and bite hand. Security at bedside.
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
## 2021-03-03 NOTE — ED Notes (Addendum)
Pt has not been taking his medication with the food we give him. Pt will only eat small portion of the food along with medication and then will not want any more of the food. MD made aware.

## 2021-03-03 NOTE — ED Notes (Addendum)
Pt was placed in padded room for safety. Security and NT on outside of padded room. Pt continued to scream, bang on door and bite his hand. MD made aware.

## 2021-03-03 NOTE — ED Provider Notes (Signed)
Patient was resting this morning.  Not causing any difficulties.  Patient has become very combative.  Had to be moved to a padded room because he was banging his head on the walls.  No loss of consciousness.  Patient will have to be Geodon again.   Vanetta Mulders, MD 03/03/21 [id number]

## 2021-03-03 NOTE — ED Notes (Signed)
Pt is currently in padded room for safety. Pt is being monitored while in padded room at this time. VS will be attempted once pt is calm.

## 2021-03-03 NOTE — ED Notes (Signed)
Pt trying to come behind nurses station. Pt was redirected back into room. Pt began screaming and hitting head on the wall. Pt was not harmed during this act. Pt redirected back into room.

## 2021-03-04 DIAGNOSIS — G40909 Epilepsy, unspecified, not intractable, without status epilepticus: Secondary | ICD-10-CM | POA: Diagnosis not present

## 2021-03-04 NOTE — Progress Notes (Signed)
Patient agitated and unable to console. Ativan 2mg  and haldol 5mg  IM Given. Will continue to monitor.

## 2021-03-04 NOTE — Progress Notes (Signed)
Several attempts to contact Care coordinator [name] [name] , no response , or call back. CSW will continue to follow.   [name].Stefano Trulson, [name], LCSWA [name]  Transitions of Care Clinical Social Worker I Direct Dial: [phone]  Fax: [phone] [name]

## 2021-03-04 NOTE — ED Provider Notes (Signed)
Emergency Medicine Observation Re-evaluation Note  Paul Hendricks is a 30 y.o. male, seen on rounds today.  Pt initially presented to the ED for complaints of group home placement. Currently, the patient is resting.  Physical Exam  BP (!) 146/79   Pulse 80   Temp 98.4 F (36.9 C) (Oral)   Resp 20   Ht 6\' 2"  (1.88 m)   Wt 91 kg   SpO2 100%   BMI 25.76 kg/m  Physical Exam General: NAD Cardiac: Regular rate Lungs: No respiratory distress Psych: STable  ED Course / MDM  EKG:EKG Interpretation  Date/Time:  Monday January 05 2021 01:56:14 EDT Ventricular Rate:  124 PR Interval:  138 QRS Duration: 74 QT Interval:  302 QTC Calculation: 433 R Axis:   88 Text Interpretation: Sinus tachycardia Otherwise normal ECG Confirmed by 11-30-1999 [phone]) on 01/05/2021 4:06:29 AM  I have reviewed the labs performed to date as well as medications administered while in observation.  Recent changes in the last 24 hours include patient given geodon yesterday and transferred to padded room for safety as he became combatitive and was striking his head on wall.  Plan  Current plan is for group home placement.      01/07/2021, MD 03/04/21 [phone]

## 2021-03-04 NOTE — ED Notes (Signed)
Patient agitated and restless, keep holding his left side cheek and drooling. Toradol given.

## 2021-03-05 DIAGNOSIS — G40909 Epilepsy, unspecified, not intractable, without status epilepticus: Secondary | ICD-10-CM | POA: Diagnosis not present

## 2021-03-05 NOTE — ED Notes (Signed)
Pt continues to verbalize loudly and remove clothing. Noted to be on and off the toilet all morning and reaching for anus area, also touching abdomen. Threw up twice, mostly clear liquid. Directed to shower and linens changed. EVS called to clean room while in shower

## 2021-03-05 NOTE — ED Provider Notes (Signed)
Emergency Medicine Observation Re-evaluation Note  Rondell Pardon is a 30 y.o. male, seen on rounds today.  Pt initially presented to the ED for complaints of No chief complaint on file. Currently, the patient is walking around the ED.  Physical Exam  BP (!) 146/79   Pulse (!) 128   Temp 98.4 F (36.9 C) (Oral)   Resp 20   Ht 6\' 2"  (1.88 m)   Wt 91 kg   SpO2 96%   BMI 25.76 kg/m  Physical Exam General: slightly agitated Cardiac: slightly tachycardic Lungs: no increased WOB Psych: calm  ED Course / MDM  EKG:EKG Interpretation  Date/Time:  Monday January 05 2021 01:56:14 EDT Ventricular Rate:  124 PR Interval:  138 QRS Duration: 74 QT Interval:  302 QTC Calculation: 433 R Axis:   88 Text Interpretation: Sinus tachycardia Otherwise normal ECG Confirmed by 11-30-1999 [phone]) on 01/05/2021 4:06:29 AM  I have reviewed the labs performed to date as well as medications administered while in observation.  Recent changes in the last 24 hours include none.  Plan  Current plan is for awaiting group home placement.  Kanaan Kagawa is not under involuntary commitment.     [name], MD 03/05/21 [phone]

## 2021-03-05 NOTE — Progress Notes (Signed)
Patient has ben taking his clothes on and off and walking out of his room. Patient was verbally redirected back to his room. Later a tech reported to the RN the patient has been shaking and appeared to be having a seizure. When the RN arrived the patient was no longer shaking. Vitals were taken with greater ease than usual. The patient appeared more sluggish than before. MD made aware. Patient is now up and moving around as before.

## 2021-03-05 NOTE — ED Notes (Signed)
Pt continues attempting to wander unit and verbalize loudly, occasionally undressing in room. Redirected to bed and to put clothes on. PRN medication given (see MAR)

## 2021-03-06 DIAGNOSIS — G40909 Epilepsy, unspecified, not intractable, without status epilepticus: Secondary | ICD-10-CM | POA: Diagnosis not present

## 2021-03-06 NOTE — ED Notes (Signed)
Patient has not slept the entire shift.  He has been physically aggressive toward staff.  He also beat his head on the door multiple times requiring staff intervention.  Currently there are 4 staff members (including security) at bedside providing constant redirection for patient.  Patient also has had multiple episodes of disrobing and attempting to exit room.

## 2021-03-06 NOTE — ED Notes (Signed)
Patient beating on door.  Redirected back to bed by staff.

## 2021-03-06 NOTE — ED Notes (Signed)
Patient asleep.

## 2021-03-07 DIAGNOSIS — G40909 Epilepsy, unspecified, not intractable, without status epilepticus: Secondary | ICD-10-CM | POA: Diagnosis not present

## 2021-03-07 NOTE — ED Notes (Signed)
Patient moaning loudly and beating self in head with closed fist.

## 2021-03-07 NOTE — ED Notes (Signed)
Pt alert during shift. Pt only ate breakfast. Pt refused medication this shift.  Pt resting at this time.

## 2021-03-07 NOTE — ED Notes (Signed)
Patient moaning and pointing to L cheek.  Patient stomping on floor.

## 2021-03-07 NOTE — ED Notes (Signed)
Patient beating head on door.  Difficult to redirect.  Multiple staff including security at bedside.

## 2021-03-08 DIAGNOSIS — G40909 Epilepsy, unspecified, not intractable, without status epilepticus: Secondary | ICD-10-CM | POA: Diagnosis not present

## 2021-03-08 LAB — CBC WITH DIFFERENTIAL/PLATELET
Abs Immature Granulocytes: 0.02 10*3/uL (ref 0.00–0.07)
Basophils Absolute: 0.1 10*3/uL (ref 0.0–0.1)
Basophils Relative: 1 %
Eosinophils Absolute: 0.7 10*3/uL — ABNORMAL HIGH (ref 0.0–0.5)
Eosinophils Relative: 9 %
HCT: 41.3 % (ref 39.0–52.0)
Hemoglobin: 13.8 g/dL (ref 13.0–17.0)
Immature Granulocytes: 0 %
Lymphocytes Relative: 50 %
Lymphs Abs: 4 10*3/uL (ref 0.7–4.0)
MCH: 30.8 pg (ref 26.0–34.0)
MCHC: 33.4 g/dL (ref 30.0–36.0)
MCV: 92.2 fL (ref 80.0–100.0)
Monocytes Absolute: 0.8 10*3/uL (ref 0.1–1.0)
Monocytes Relative: 9 %
Neutro Abs: 2.5 10*3/uL (ref 1.7–7.7)
Neutrophils Relative %: 31 %
Platelets: 228 10*3/uL (ref 150–400)
RBC: 4.48 MIL/uL (ref 4.22–5.81)
RDW: 12.6 % (ref 11.5–15.5)
WBC: 8.1 10*3/uL (ref 4.0–10.5)
nRBC: 0 % (ref 0.0–0.2)

## 2021-03-08 LAB — COMPREHENSIVE METABOLIC PANEL
ALT: 104 U/L — ABNORMAL HIGH (ref 0–44)
AST: 130 U/L — ABNORMAL HIGH (ref 15–41)
Albumin: 3.7 g/dL (ref 3.5–5.0)
Alkaline Phosphatase: 72 U/L (ref 38–126)
Anion gap: 10 (ref 5–15)
BUN: 12 mg/dL (ref 6–20)
CO2: 22 mmol/L (ref 22–32)
Calcium: 8.9 mg/dL (ref 8.9–10.3)
Chloride: 107 mmol/L (ref 98–111)
Creatinine, Ser: 0.88 mg/dL (ref 0.61–1.24)
GFR, Estimated: >60 mL/min (ref >60)
Glucose, Bld: 139 mg/dL — ABNORMAL HIGH (ref 70–99)
Potassium: 3.7 mmol/L (ref 3.5–5.1)
Sodium: 139 mmol/L (ref 135–145)
Total Bilirubin: 0.5 mg/dL (ref 0.3–1.2)
Total Protein: 6.8 g/dL (ref 6.5–8.1)

## 2021-03-08 LAB — MAGNESIUM: Magnesium: 2.2 mg/dL (ref 1.7–2.4)

## 2021-03-08 LAB — VALPROIC ACID LEVEL: Valproic Acid Lvl: 50 ug/mL (ref 50.0–100.0)

## 2021-03-08 NOTE — ED Provider Notes (Signed)
Emergency Medicine Observation Re-evaluation Note  Paul Hendricks is a 30 y.o. male, seen on rounds today.  Pt initially presented to the ED for complaints of No chief complaint on file. Currently, the patient is sleeping. Per nursing staff, patient was more agitated than usual overnight.  He did take Ativan in the morning and has been resting since then.  Paul Hendricks has history of seizures and autism.  He has also not been taking his seizures med.  Psychiatry has signed out, he is a TOC hold now -awaiting group home placement.  Physical Exam  BP (!) 158/102 (BP Location: Left Arm)   Pulse (!) 140   Temp 99.8 F (37.7 C) (Axillary)   Resp 18   Ht 6\' 2"  (1.88 m)   Wt 91 kg   SpO2 98%   BMI 25.76 kg/m  Physical Exam General: Resting comfortably Cardiac: Tachycardia per records overnight Lungs: No respiratory distress Psych: Calm  ED Course / MDM  EKG:EKG Interpretation  Date/Time:  Monday January 05 2021 01:56:14 EDT Ventricular Rate:  124 PR Interval:  138 QRS Duration: 74 QT Interval:  302 QTC Calculation: 433 R Axis:   88 Text Interpretation: Sinus tachycardia Otherwise normal ECG Confirmed by 11-30-1999 [phone]) on 01/05/2021 4:06:29 AM  I have reviewed the labs performed to date as well as medications administered while in observation.  Recent changes in the last 24 hours include : More restless overnight, continues to decline oral seizure meds.  Plan  Current plan is for awaiting placement with group home. He has autism, we do not think forcing medication is going to be solution.  We will continue to monitor for any seizures.  Patient is taking his food and also amenable to Ativan, which is helping.  Paul Hendricks is not under involuntary commitment.     Paul Pastures, MD 03/08/21 [phone]

## 2021-03-08 NOTE — Progress Notes (Signed)
CSW reviewed chart - patient is currently waiting on group home placement. Patient is active with [facility], has a care coordinator, and a legal guardian.  Per latest TOC notes, there are no bed offers from any homes at this time.  Edwin Dada, [name], LCSW Transitions of Care  Clinical Social Worker II [phone]

## 2021-03-08 NOTE — ED Notes (Signed)
Patient asleep for dinner at this time

## 2021-03-09 DIAGNOSIS — G40909 Epilepsy, unspecified, not intractable, without status epilepticus: Secondary | ICD-10-CM | POA: Diagnosis not present

## 2021-03-09 MED ORDER — OLANZAPINE 10 MG PO TBDP
10.0000 mg | ORAL_TABLET | Freq: Two times a day (BID) | ORAL | Status: DC | PRN
  Administered 2021-03-10 – 2021-05-11 (×60): 10 mg via ORAL
  Filled 2021-03-09 (×73): qty 1

## 2021-03-09 NOTE — ED Provider Notes (Signed)
Emergency Medicine Observation Re-evaluation Note  Sevin Langenbach is a 30 y.o. male, seen on rounds today.  Pt initially presented to the ED for complaints of No chief complaint on file. Currently, the patient is sleeping. Per nursing staff, patient was more agitated than usual overnight.  He did take Ativan in the morning and has been resting since then.  Rust has history of seizures and autism.  He has also not been taking his seizures med.  Psychiatry has signed out, he is a TOC hold now -awaiting group home placement.  He had to use all of his as needed Ativan last night and are requesting further as needed's for the day.  Physical Exam  BP (!) 146/107 (BP Location: Left Arm)   Pulse 96   Temp 99.8 F (37.7 C) (Axillary)   Resp 18   Ht 6\' 2"  (1.88 m)   Wt 91 kg   SpO2 100%   BMI 25.76 kg/m  Physical Exam General: Resting comfortably Cardiac: Tachycardia per records overnight Lungs: No respiratory distress Psych: Calm  ED Course / MDM  EKG:EKG Interpretation  Date/Time:  Monday January 05 2021 01:56:14 EDT Ventricular Rate:  124 PR Interval:  138 QRS Duration: 74 QT Interval:  302 QTC Calculation: 433 R Axis:   88 Text Interpretation: Sinus tachycardia Otherwise normal ECG Confirmed by 11-30-1999 [phone]) on 01/05/2021 4:06:29 AM  I have reviewed the labs performed to date as well as medications administered while in observation.  Recent changes in the last 24 hours include : More restless overnight, continues to decline oral seizure meds.  Plan  Current plan is for awaiting placement with group home. He has autism, we do not think forcing medication is going to be solution.  We will continue to monitor for any seizures.  Patient is taking his food and also amenable to Ativan, which is helping.  With him having some continued agitation and already out of his as needed Ativan this morning we will add Zyprexa as needed.  Coleston Dirosa is not under  involuntary commitment.        [name], DO 03/09/21 [phone]

## 2021-03-09 NOTE — ED Notes (Signed)
Patient difficult to redirect.  Physically aggressive toward staff.  Multiple staff members at bedside.

## 2021-03-10 DIAGNOSIS — G40909 Epilepsy, unspecified, not intractable, without status epilepticus: Secondary | ICD-10-CM | POA: Diagnosis not present

## 2021-03-10 MED ORDER — ZIPRASIDONE MESYLATE 20 MG IM SOLR
20.0000 mg | Freq: Once | INTRAMUSCULAR | Status: DC
Start: 2021-03-10 — End: 2021-04-21
  Filled 2021-03-10: qty 20

## 2021-03-10 NOTE — Progress Notes (Signed)
CSW and CSW supervisor Sharol Roussel has attempted on several occasions to contact pt's [facility]. Several VM has been left with no return call. CSW attempted to contact pt's LG [name] Torres, pt's mother does not answer, unable to leave a message as the phone continues to ring. CSW continues to follow.   [name].Brandi Tomlinson, [name], LCSWA [name]  Transitions of Care Clinical Social Worker I Direct Dial: [phone]  Fax: [phone] [name]

## 2021-03-10 NOTE — ED Notes (Signed)
Pt yelling and hitting his head with his hands, also biting his hand.

## 2021-03-10 NOTE — ED Provider Notes (Signed)
Emergency Medicine Observation Re-evaluation Note  Paul Hendricks is a 30 y.o. male, seen on rounds today.  Pt initially presented to the ED for complaints of No chief complaint on file. Currently, the patient is sleeping. Per nursing staff, patient was more agitated than usual overnight.  He did take Ativan in the morning and has been resting since then.  Paul Hendricks has history of seizures and autism.  He has also not been taking his seizures med.  Psychiatry has signed out, he is a TOC hold now -awaiting group home placement.  Patient was much Calmer yesterday per nursing report. Physical Exam  BP (!) 146/107 (BP Location: Left Arm)   Pulse 96   Temp 99.8 F (37.7 C) (Axillary)   Resp 18   Ht 6\' 2"  (1.88 m)   Wt 91 kg   SpO2 100%   BMI 25.76 kg/m  Physical Exam General: Resting comfortably Cardiac: Tachycardia per records overnight Lungs: No respiratory distress Psych: Calm  ED Course / MDM  EKG:EKG Interpretation  Date/Time:  Monday January 05 2021 01:56:14 EDT Ventricular Rate:  124 PR Interval:  138 QRS Duration: 74 QT Interval:  302 QTC Calculation: 433 R Axis:   88 Text Interpretation: Sinus tachycardia Otherwise normal ECG Confirmed by 11-30-1999 [phone]) on 01/05/2021 4:06:29 AM  I have reviewed the labs performed to date as well as medications administered while in observation.  Recent changes in the last 24 hours include : More restless overnight, continues to decline oral seizure meds.  Plan  Current plan is for awaiting placement with group home. He has autism, we do not think forcing medication is going to be solution.  We will continue to monitor for any seizures.  Patient is taking his food and also amenable to Ativan, which is helping.  Patient had Zyprexa added to his as needed medications yesterday and seems a bit less agitated today.  Paul Hendricks is not under involuntary commitment.           Paul Pastures, DO 03/10/21 [phone]

## 2021-03-11 DIAGNOSIS — G40909 Epilepsy, unspecified, not intractable, without status epilepticus: Secondary | ICD-10-CM | POA: Diagnosis not present

## 2021-03-11 NOTE — Progress Notes (Signed)
[facility] was provided with possible [name] placement information. Sanjuana Mae, with [facility] [phone]).    [name].Nereida Schepp, [name], LCSWA [name]  Transitions of Care Clinical Social Worker I Direct Dial: [phone]  Fax: [phone] [name]

## 2021-03-11 NOTE — ED Notes (Signed)
Patient in room stomping and moaning.  Patient becoming physically aggressive toward staff.

## 2021-03-11 NOTE — ED Notes (Signed)
Patient wandering unit attempting to go in laundry hamper.  Constant redirection from staff unsuccessful.

## 2021-03-12 DIAGNOSIS — G40909 Epilepsy, unspecified, not intractable, without status epilepticus: Secondary | ICD-10-CM | POA: Diagnosis not present

## 2021-03-12 NOTE — ED Notes (Signed)
Since 7:10 AM, pt has reamained mostly calm and cooperative. He required occasional redirection.

## 2021-03-12 NOTE — ED Notes (Signed)
Pt became agitated and started yelling and hitting walls, prn meds given per mar

## 2021-03-12 NOTE — ED Provider Notes (Signed)
Emergency Medicine Observation Re-evaluation Note  Paul Hendricks is a 30 y.o. male, seen on rounds today.  Pt initially presented to the ED for complaints of No chief complaint on file. Currently, the patient is awaiting placement.  Physical Exam  BP (!) 127/97 (BP Location: Left Arm)   Pulse (!) 104   Temp 98 F (36.7 C) (Oral)   Resp 18   Ht 6\' 2"  (1.88 m)   Wt 91 kg   SpO2 97%   BMI 25.76 kg/m  Physical Exam General: Resting and asleep Psych: No current agitation  ED Course / MDM  EKG:EKG Interpretation  Date/Time:  Monday January 05 2021 01:56:14 EDT Ventricular Rate:  124 PR Interval:  138 QRS Duration: 74 QT Interval:  302 QTC Calculation: 433 R Axis:   88 Text Interpretation: Sinus tachycardia Otherwise normal ECG Confirmed by 11-30-1999 [phone]) on 01/05/2021 4:06:29 AM  I have reviewed the labs performed to date as well as medications administered while in observation.  Recent changes in the last 24 hours include none.  Plan  Current plan is for awaiting placement.  Paul Hendricks is not under involuntary commitment.     Paul Hendricks, [name], MD 03/12/21 [phone]

## 2021-03-13 DIAGNOSIS — G40909 Epilepsy, unspecified, not intractable, without status epilepticus: Secondary | ICD-10-CM | POA: Diagnosis not present

## 2021-03-13 NOTE — ED Notes (Signed)
Patient banging on window and stomping on floor.  Patient physically aggressive toward staff.

## 2021-03-13 NOTE — ED Provider Notes (Signed)
Emergency Medicine Observation Re-evaluation Note  Paul Hendricks is a 30 y.o. male, seen on rounds today.  Pt initially presented to the ED for complaints of No chief complaint on file. Currently, the patient is resting comfortably.  Physical Exam  BP (!) 127/97 (BP Location: Left Arm)   Pulse (!) 104   Temp 98 F (36.7 C) (Oral)   Resp 18   Ht 6\' 2"  (1.88 m)   Wt 91 kg   SpO2 97%   BMI 25.76 kg/m  Physical Exam General: No acute distress Cardiac: Regular rate and rhythm Lungs: Normal breathing rate, no accessory muscle use Psych: Continue behavioral problems  ED Course / MDM  EKG:EKG Interpretation  Date/Time:  Monday January 05 2021 01:56:14 EDT Ventricular Rate:  124 PR Interval:  138 QRS Duration: 74 QT Interval:  302 QTC Calculation: 433 R Axis:   88 Text Interpretation: Sinus tachycardia Otherwise normal ECG Confirmed by 11-30-1999 [phone]) on 01/05/2021 4:06:29 AM  I have reviewed the labs performed to date as well as medications administered while in observation.  Recent changes in the last 24 hours include intermittent episodes of agitation.  Plan  Current plan is for continued search for placement.01/07/2021  Paul Hendricks is not under involuntary commitment.     [name], MD 03/13/21 [phone]

## 2021-03-13 NOTE — ED Notes (Signed)
Patient beating window with closed fist. Unable to redirect.

## 2021-03-14 DIAGNOSIS — G40909 Epilepsy, unspecified, not intractable, without status epilepticus: Secondary | ICD-10-CM | POA: Diagnosis not present

## 2021-03-14 NOTE — ED Notes (Signed)
Patient stomping on floor and beating on window.

## 2021-03-14 NOTE — ED Notes (Signed)
Since waking up today, pt has been cooperative and redirectable. Pt makes periodic loud vocalizations and flaps his hands. Occasionally, he pounds his feet on the floor or fist on the bedside table.

## 2021-03-14 NOTE — ED Notes (Signed)
Patient stomping on floor and beating on bed.  Snacks and drink given.  Unable to redirect.

## 2021-03-15 DIAGNOSIS — G40909 Epilepsy, unspecified, not intractable, without status epilepticus: Secondary | ICD-10-CM | POA: Diagnosis not present

## 2021-03-15 NOTE — ED Notes (Signed)
Pt alert this shift.  Pt ate breakfast and lunch.  Pt refused medication this AM. Patient did take Depakote this AM. Pt took afternoon medication.

## 2021-03-15 NOTE — ED Provider Notes (Signed)
Emergency Medicine Observation Re-evaluation Note  Paul Hendricks is a 30 y.o. male, seen on rounds today.  Pt initially presented to the ED for complaints of  behavioral symptoms, and inappropriate discharge/dumping of patient by guardian/group home to ED - pt has been in need of new placement, TOC working w LME.   Physical Exam  BP (!) 123/93 (BP Location: Right Arm)   Pulse 98   Temp 98.2 F (36.8 C) (Oral)   Resp 18   Ht 1.88 m (6\' 2" )   Wt 91 kg   SpO2 99%   BMI 25.76 kg/m  Physical Exam General: calm, nad.  Cardiac: regular rate Lungs: breathing comfortably Psych: calm, nad.   ED Course / MDM    I have reviewed the labs performed to date as well as medications administered while in observation.  Recent changes in the last 24 hours include ED obs and TOC placement.   Plan  TOC continues to work with guardian, LME to find new placement.     , MD 03/15/21 [phone]

## 2021-03-16 DIAGNOSIS — G40909 Epilepsy, unspecified, not intractable, without status epilepticus: Secondary | ICD-10-CM | POA: Diagnosis not present

## 2021-03-16 NOTE — ED Notes (Signed)
Patient beating on glass.  Unable to redirect.

## 2021-03-16 NOTE — ED Provider Notes (Signed)
Emergency Medicine Observation Re-evaluation Note  Paul Hendricks is a 30 y.o. male, seen on rounds today.  Pt initially presented to the ED for complaints of No chief complaint on file. Currently, the patient is no changes overnight.  Physical Exam  BP (!) 123/93 (BP Location: Right Arm)   Pulse 98   Temp 98.2 F (36.8 C) (Oral)   Resp 18   Ht 6\' 2"  (1.88 m)   Wt 91 kg   SpO2 99%   BMI 25.76 kg/m  Physical Exam General: calm  Lungs: breathing comfortably Psych: resting  ED Course / MDM  EKG:EKG Interpretation  Date/Time:  Monday January 05 2021 01:56:14 EDT Ventricular Rate:  124 PR Interval:  138 QRS Duration: 74 QT Interval:  302 QTC Calculation: 433 R Axis:   88 Text Interpretation: Sinus tachycardia Otherwise normal ECG Confirmed by 11-30-1999 [phone]) on 01/05/2021 4:06:29 AM  I have reviewed the labs performed to date as well as medications administered while in observation.  Recent changes in the last 24 hours include none.  Plan  Current plan is for TOC.  Amaro Mangold is not under involuntary commitment.     [name], DO 03/16/21 [phone]

## 2021-03-16 NOTE — ED Notes (Signed)
Patient stomping on floor and beating on glass.  Unable to redirect.

## 2021-03-17 DIAGNOSIS — G40909 Epilepsy, unspecified, not intractable, without status epilepticus: Secondary | ICD-10-CM | POA: Diagnosis not present

## 2021-03-17 NOTE — Progress Notes (Signed)
CM spoke with patient's CC Paul Hendricks who reports she has followed up with the referrals sent out: [facility]: has requested that another referral be sent and this has been done.  [facility]: team is still reviewing the referral  3 additional [name] have been identified and Paul Hendricks plans to reach out to them today: [facility], [facility], and [employer].

## 2021-03-17 NOTE — ED Provider Notes (Signed)
Emergency Medicine Observation Re-evaluation Note  Paul Hendricks is a 30 y.o. male, seen on rounds today.  Pt initially presented to the ED for complaints of No chief complaint on file. Currently, the patient is sitting in bed, no acute distress.  Physical Exam  BP (!) 126/93 (BP Location: Left Wrist)   Pulse (!) 120   Temp 98.2 F (36.8 C) (Oral)   Resp 20   Ht 6\' 2"  (1.88 m)   Wt 91 kg   SpO2 95%   BMI 25.76 kg/m  Physical Exam General: no acute distress Lungs: normal WOB Psych: no current agitation  ED Course / MDM  EKG:EKG Interpretation  Date/Time:  Monday January 05 2021 01:56:14 EDT Ventricular Rate:  124 PR Interval:  138 QRS Duration: 74 QT Interval:  302 QTC Calculation: 433 R Axis:   88 Text Interpretation: Sinus tachycardia Otherwise normal ECG Confirmed by 11-30-1999 [phone]) on 01/05/2021 4:06:29 AM  I have reviewed the labs performed to date as well as medications administered while in observation.  No recent changes in the last 24 hours.  Plan  Current plan is for Group Home Placement.  Paul Hendricks is not under involuntary commitment.     [name], MD 03/17/21 [phone]

## 2021-03-18 DIAGNOSIS — G40909 Epilepsy, unspecified, not intractable, without status epilepticus: Secondary | ICD-10-CM | POA: Diagnosis not present

## 2021-03-18 NOTE — ED Notes (Signed)
Pt alert and calm this shift. Redirectable.

## 2021-03-18 NOTE — ED Notes (Signed)
Pt is shouting and banging his hands on the counter. Unable to calm patient down at this time. IM meds pulled for pt

## 2021-03-18 NOTE — ED Provider Notes (Signed)
Emergency Medicine Observation Re-evaluation Note  Elijio Staples is a 30 y.o. male, seen on rounds today.  Pt initially presented to the ED for complaints of No chief complaint on file. Currently, the patient is sleeping.  Physical Exam  BP (!) 126/93 (BP Location: Left Wrist)   Pulse (!) 120   Temp 98.2 F (36.8 C) (Oral)   Resp 20   Ht 6\' 2"  (1.88 m)   Wt 91 kg   SpO2 95%   BMI 25.76 kg/m  Physical Exam General: sleeping Cardiac: intermittent tachycardia Lungs: clear Psych: sleeping  ED Course / MDM  EKG:EKG Interpretation  Date/Time:  Monday January 05 2021 01:56:14 EDT Ventricular Rate:  124 PR Interval:  138 QRS Duration: 74 QT Interval:  302 QTC Calculation: 433 R Axis:   88 Text Interpretation: Sinus tachycardia Otherwise normal ECG Confirmed by 11-30-1999 [phone]) on 01/05/2021 4:06:29 AM  I have reviewed the labs performed to date as well as medications administered while in observation.  Recent changes in the last 24 hours include none.  Plan  Current plan is for placement.  Pheng Prokop is not under involuntary commitment.     [name], MD 03/18/21 1304

## 2021-03-19 DIAGNOSIS — G40909 Epilepsy, unspecified, not intractable, without status epilepticus: Secondary | ICD-10-CM | POA: Diagnosis not present

## 2021-03-19 NOTE — Progress Notes (Addendum)
CSW spoke with CC [name] [name], who stated Sanjuana Mae , from [facility] want to set up time to meet pt for screening. CSW attempted contact Ryneshia , no response left HIPPA compliant VM.    11:30am CSW spoke with , she stated she will come see pt Monday Oct 31st around 10:30am.   12:07pm CSW recived called from Sanjuana Mae [phone]) she stated she will not be able to make it Monday Oct 31st at 10:30am, she will call CSW back with a better time.   [name].Quinlynn Cuthbert, [name], LCSWA [name]  Transitions of Care Clinical Social Worker I Direct Dial: [phone]  Fax: [phone] [name]

## 2021-03-19 NOTE — ED Notes (Signed)
Pt alert this shift.  Pt ate breakfast and lunch. Pt make noises. Pt took medication this shift. Pt not aggressive or agitated.

## 2021-03-19 NOTE — ED Notes (Signed)
Patient in room stomping on floor and banging on bed.  Unable to redirect.

## 2021-03-19 NOTE — ED Notes (Signed)
Patient moaning and stomping on the floor.  Unable to redirect.

## 2021-03-19 NOTE — ED Notes (Signed)
Patient in room moaning and beating on door.

## 2021-03-19 NOTE — ED Notes (Signed)
Patient beating on glass window with closed fist.

## 2021-03-19 NOTE — ED Provider Notes (Signed)
Emergency Medicine Observation Re-evaluation Note  Paul Hendricks is a 30 y.o. male, seen on rounds today.  Pt initially presented to the ED for complaints of No chief complaint on file. Currently, the patient is asleep.  Physical Exam  BP 113/64 (BP Location: Right Arm)   Pulse 94   Temp 98.6 F (37 C) (Oral)   Resp 20   Ht 6\' 2"  (1.88 m)   Wt 91 kg   SpO2 100%   BMI 25.76 kg/m  Physical Exam General: sleeping, NAD Cardiac: intermittent tachycardia noted Lungs: Even and unlabored Psych: sleeping, no agitation  ED Course / MDM  EKG:EKG Interpretation  Date/Time:  Monday January 05 2021 01:56:14 EDT Ventricular Rate:  124 PR Interval:  138 QRS Duration: 74 QT Interval:  302 QTC Calculation: 433 R Axis:   88 Text Interpretation: Sinus tachycardia Otherwise normal ECG Confirmed by 11-30[birth year]1999 [phone]) on 01/05/2021 4:06:29 AM  I have reviewed the labs performed to date as well as medications administered while in observation.  Recent changes in the last 24 hours include some agitation noted overnight intermittently which appears to have resolved.  Plan  Current plan is for placement.  Paul Hendricks is not under involuntary commitment.  Last social work note 10/25: "CM spoke with patient's CC Paul Hendricks who reports she has followed up with the referrals sent out: [facility]: has requested that another referral be sent and this has been done.   [facility]: team is still reviewing the referral   3 additional [name] have been identified and Paul Hendricks plans to reach out to them today: [facility], [facility], and [facility]."        [place], MD 03/19/21 [phone]

## 2021-03-20 DIAGNOSIS — G40909 Epilepsy, unspecified, not intractable, without status epilepticus: Secondary | ICD-10-CM | POA: Diagnosis not present

## 2021-03-20 NOTE — ED Provider Notes (Signed)
Emergency Medicine Observation Re-evaluation Note  Paul Hendricks is a 30 y.o. male, seen on rounds today at 0730.  Pt initially presented to the ED for complaints of No chief complaint on file. Currently, the patient is resting comfortably.  Physical Exam  BP (!) 139/113 (BP Location: Right Arm)   Pulse (!) 117   Temp 98.6 F (37 C) (Oral)   Resp 18   Ht 6\' 2"  (1.88 m)   Wt 91 kg   SpO2 96%   BMI 25.76 kg/m  Physical Exam General: NAD   ED Course / MDM  EKG:EKG Interpretation  Date/Time:  Monday January 05 2021 01:56:14 EDT Ventricular Rate:  124 PR Interval:  138 QRS Duration: 74 QT Interval:  302 QTC Calculation: 433 R Axis:   88 Text Interpretation: Sinus tachycardia Otherwise normal ECG Confirmed by 11-30-1999 [phone]) on 01/05/2021 4:06:29 AM  I have reviewed the labs performed to date as well as medications administered while in observation.  Recent changes in the last 24 hours include no acute events reported.  Plan  Current plan is for placement.  Paul Hendricks is not under involuntary commitment.     [name], MD 03/20/21 [phone]

## 2021-03-20 NOTE — ED Notes (Addendum)
Patient given several PRN's throughout the night for agitation including yelling, beating head on glass, and stomping on floor.  Multiple attempts at deescalation by several staff members unsuccessful.

## 2021-03-20 NOTE — ED Notes (Signed)
Pt was very cooperative to eat 50 of his food however he was able to eat chicken broth with crackers.  Pt also was able to change his clothes and his bed in order for the Nurse to give his med's.  Pt is now a sleep and when he wakes up there are more of his cookie to eat.

## 2021-03-21 ENCOUNTER — Emergency Department (HOSPITAL_COMMUNITY): Payer: Medicaid Other

## 2021-03-21 DIAGNOSIS — G40909 Epilepsy, unspecified, not intractable, without status epilepticus: Secondary | ICD-10-CM | POA: Diagnosis not present

## 2021-03-21 NOTE — ED Notes (Signed)
Patient was given oral medicine early because he was given [place] a expression of showing where the pain is bothering him. Pt is now resting and given graham crackers and juice to was snack after the medicine was given before going back to bed.

## 2021-03-21 NOTE — ED Notes (Signed)
Patient was given his lunch tray. His meal came already cut up in pieces.

## 2021-03-21 NOTE — ED Provider Notes (Signed)
7:29 PM Patient seemingly had an unprovoked episode during which he smashed his head against a glass pane.  This created a spiderweb effect in the glass, but the patient soon thereafter was calm.  And on exam he has no crepitus, less than 0.5 cm abrasion.  He continues to move all extremities spontaneously.  CT scan ordered.   Gerhard Munch, MD 03/21/21 [id number]

## 2021-03-21 NOTE — ED Notes (Signed)
Pt came out of room and was banging head on window and busted the glass with his head. Pt redirected to room. Pt is very agitated. Charge and md aware. Meds given per mar

## 2021-03-21 NOTE — ED Notes (Signed)
Patient woke up from his nap and was given his breakfast after it was cut up.

## 2021-03-21 NOTE — ED Notes (Signed)
Pt has been resting most of this shift and has been redirectable when necessary.

## 2021-03-22 DIAGNOSIS — G40909 Epilepsy, unspecified, not intractable, without status epilepticus: Secondary | ICD-10-CM | POA: Diagnosis not present

## 2021-03-22 NOTE — ED Provider Notes (Signed)
Emergency Medicine Observation Re-evaluation Note  Paul Hendricks is a 30 y.o. male, seen on rounds today at 0730.  Pt initially presented to the ED for complaints of No chief complaint on file. Currently, the patient is resting comfortably.  Physical Exam  BP (!) 139/113 (BP Location: Right Arm)   Pulse (!) 117   Temp 98.6 F (37 C) (Oral)   Resp 18   Ht 6\' 2"  (1.88 m)   Wt 91 kg   SpO2 96%   BMI 25.76 kg/m  Physical Exam General: NAD   ED Course / MDM  EKG:EKG Interpretation  Date/Time:  Monday January 05 2021 01:56:14 EDT Ventricular Rate:  124 PR Interval:  138 QRS Duration: 74 QT Interval:  302 QTC Calculation: 433 R Axis:   88 Text Interpretation: Sinus tachycardia Otherwise normal ECG Confirmed by [birth year]-30[birth year]1999 [phone]) on 01/05/2021 4:06:29 AM  I have reviewed the labs performed to date as well as medications administered while in observation.  Recent changes in the last 24 hours include no acute events reported.  Plan  Current plan is for placement.  Paul Hendricks is not under involuntary commitment.     Paul Pastures, MD 03/22/21 [phone]

## 2021-03-22 NOTE — ED Notes (Signed)
Pt care taken, pt is calm and cooperative at this time

## 2021-03-22 NOTE — ED Notes (Signed)
Patient asleep for breakfast 

## 2021-03-23 DIAGNOSIS — G40909 Epilepsy, unspecified, not intractable, without status epilepticus: Secondary | ICD-10-CM | POA: Diagnosis not present

## 2021-03-23 NOTE — ED Provider Notes (Signed)
Emergency Medicine Observation Re-evaluation Note  Paul Hendricks is a 30 y.o. male, seen on rounds today.  Pt initially presented to the ED for complaints of No chief complaint on file. Currently, the patient is patient is in bed watching TV but calm.  Physical Exam  BP (!) 139/113 (BP Location: Right Arm)   Pulse (!) 117   Temp 98.6 F (37 C) (Oral)   Resp 18   Ht 6\' 2"  (1.88 m)   Wt 91 kg   SpO2 96%   BMI 25.76 kg/m  Physical Exam General: No distress at this time Cardiac: Intermittently tachycardic Lungs: Lungs are clear Psych: Calm at this time but intermittently agitated  ED Course / MDM  EKG:EKG Interpretation  Date/Time:  Monday January 05 2021 01:56:14 EDT Ventricular Rate:  124 PR Interval:  138 QRS Duration: 74 QT Interval:  302 QTC Calculation: 433 R Axis:   88 Text Interpretation: Sinus tachycardia Otherwise normal ECG Confirmed by 11-30[birth year]1999 [phone]) on 01/05/2021 4:06:29 AM  I have reviewed the labs performed to date as well as medications administered while in observation.  Recent changes in the last 24 hours include patient did break a glass panel with his head 2 days ago but has not had any further outbursts.  Plan  Current plan is for placement.  Edmond Ginsberg is not under involuntary commitment.     [name], MD 03/23/21 [phone]

## 2021-03-23 NOTE — ED Notes (Signed)
Pt refuse vital signs .

## 2021-03-24 DIAGNOSIS — F840 Autistic disorder: Secondary | ICD-10-CM | POA: Diagnosis not present

## 2021-03-24 DIAGNOSIS — R569 Unspecified convulsions: Secondary | ICD-10-CM | POA: Diagnosis present

## 2021-03-24 DIAGNOSIS — Z20822 Contact with and (suspected) exposure to covid-19: Secondary | ICD-10-CM | POA: Diagnosis not present

## 2021-03-24 DIAGNOSIS — Z79899 Other long term (current) drug therapy: Secondary | ICD-10-CM | POA: Diagnosis not present

## 2021-03-24 DIAGNOSIS — G40909 Epilepsy, unspecified, not intractable, without status epilepticus: Secondary | ICD-10-CM | POA: Diagnosis not present

## 2021-03-24 LAB — CBC WITH DIFFERENTIAL/PLATELET
Abs Immature Granulocytes: 0.03 10*3/uL (ref 0.00–0.07)
Basophils Absolute: 0.1 10*3/uL (ref 0.0–0.1)
Basophils Relative: 1 %
Eosinophils Absolute: 0.1 10*3/uL (ref 0.0–0.5)
Eosinophils Relative: 1 %
HCT: 42.2 % (ref 39.0–52.0)
Hemoglobin: 14.3 g/dL (ref 13.0–17.0)
Immature Granulocytes: 0 %
Lymphocytes Relative: 29 %
Lymphs Abs: 3.3 10*3/uL (ref 0.7–4.0)
MCH: 31.4 pg (ref 26.0–34.0)
MCHC: 33.9 g/dL (ref 30.0–36.0)
MCV: 92.7 fL (ref 80.0–100.0)
Monocytes Absolute: 1.3 10*3/uL — ABNORMAL HIGH (ref 0.1–1.0)
Monocytes Relative: 12 %
Neutro Abs: 6.5 10*3/uL (ref 1.7–7.7)
Neutrophils Relative %: 57 %
Platelets: 210 10*3/uL (ref 150–400)
RBC: 4.55 MIL/uL (ref 4.22–5.81)
RDW: 12.8 % (ref 11.5–15.5)
WBC: 11.3 10*3/uL — ABNORMAL HIGH (ref 4.0–10.5)
nRBC: 0 % (ref 0.0–0.2)

## 2021-03-24 LAB — COMPREHENSIVE METABOLIC PANEL
ALT: 80 U/L — ABNORMAL HIGH (ref 0–44)
AST: 87 U/L — ABNORMAL HIGH (ref 15–41)
Albumin: 4.3 g/dL (ref 3.5–5.0)
Alkaline Phosphatase: 76 U/L (ref 38–126)
Anion gap: 9 (ref 5–15)
BUN: 15 mg/dL (ref 6–20)
CO2: 23 mmol/L (ref 22–32)
Calcium: 9.1 mg/dL (ref 8.9–10.3)
Chloride: 104 mmol/L (ref 98–111)
Creatinine, Ser: 0.87 mg/dL (ref 0.61–1.24)
GFR, Estimated: >60 mL/min (ref >60)
Glucose, Bld: 120 mg/dL — ABNORMAL HIGH (ref 70–99)
Potassium: 4.7 mmol/L (ref 3.5–5.1)
Sodium: 136 mmol/L (ref 135–145)
Total Bilirubin: 1.4 mg/dL — ABNORMAL HIGH (ref 0.3–1.2)
Total Protein: 7.4 g/dL (ref 6.5–8.1)

## 2021-03-24 LAB — VALPROIC ACID LEVEL: Valproic Acid Lvl: 14 ug/mL — ABNORMAL LOW (ref 50.0–100.0)

## 2021-03-24 NOTE — ED Notes (Signed)
Patient threw up food.

## 2021-03-24 NOTE — ED Provider Notes (Signed)
Emergency Medicine Observation Re-evaluation Note  Paul Hendricks is a 30 y.o. male, seen on rounds today.  Pt initially presented to the ED for complaints of No chief complaint on file. Currently, the patient is resting.  Physical Exam  BP (!) 143/92 (BP Location: Right Arm)   Pulse (!) 114   Temp 98.6 F (37 C) (Oral)   Resp 18   Ht 6\' 2"  (1.88 m)   Wt 91 kg   SpO2 98%   BMI 25.76 kg/m  Physical Exam General: resting comfortably, NAD Lungs: normal WOB Psych: currently calm and resting  ED Course / MDM  EKG:EKG Interpretation  Date/Time:  Monday January 05 2021 01:56:14 EDT Ventricular Rate:  124 PR Interval:  138 QRS Duration: 74 QT Interval:  302 QTC Calculation: 433 R Axis:   88 Text Interpretation: Sinus tachycardia Otherwise normal ECG Confirmed by 11-30-1999 [phone]) on 01/05/2021 4:06:29 AM  I have reviewed the labs performed to date as well as medications administered while in observation.  Recent changes in the last 24 hours include seizure-like activity noted overnight, resolved with Ativan.  Plan  Current plan is for Keppra level, reevaluation and placement.  Paul Hendricks is not under involuntary commitment.     Paul Hendricks, Paul Hendricks 03/24/21 13/01/22

## 2021-03-24 NOTE — ED Notes (Signed)
Patient resting comfortably, no s/s  of distress. Sitter at bedside.

## 2021-03-24 NOTE — Progress Notes (Addendum)
CSW spoke with CC [name] [name], she stated she has calls to make today and trying to fix issues with her computer. She stated she will follow up with CSW later on today. CSW attempted to call Ryneshia to received time and date of her visit,  no answer left VM .   1:40pm CSW spoke with [name] provided Lilla Shook, she stated she will be coming to see pt Monday Nov 7th around 10:30pm  [name].Koleen Celia, [name], LCSWA [name]  Transitions of Care Clinical Social Worker I Direct Dial: [phone]  Fax: [phone] [name]

## 2021-03-24 NOTE — ED Notes (Signed)
Seizure activity reported by sitter on exam Paul Hendricks was observed by this writer having tonic-clonic seizure like activity ativan 2mg  IM given per prn order. About 10 min after injection Paul Hendricks seizure activity stopped EDP Dr. made aware.

## 2021-03-24 NOTE — ED Notes (Signed)
Pt. had a seizure RN was informed

## 2021-03-24 NOTE — ED Notes (Signed)
Patient has been alert this shift.  Patient took AM medication. Patient ate breakfast. Patient resting comfortably. Sitter at bedside.

## 2021-03-25 DIAGNOSIS — G40909 Epilepsy, unspecified, not intractable, without status epilepticus: Secondary | ICD-10-CM | POA: Diagnosis not present

## 2021-03-25 NOTE — ED Provider Notes (Signed)
Emergency Medicine Observation Re-evaluation Note  Paul Hendricks is a 30 y.o. male, seen on rounds today.  Pt initially presented to the ED for complaints of No chief complaint on file. Currently, the patient is awaiting placement by social work.  Physical Exam  BP (!) 143/92 (BP Location: Right Arm)   Pulse (!) 114   Temp 98.6 F (37 C) (Oral)   Resp 18   Ht 6\' 2"  (1.88 m)   Wt 91 kg   SpO2 98%   BMI 25.76 kg/m  Physical Exam General: Awake, mental baseline Cardiac: Extremities well perfused Lungs: Breathing even and unlabored Psych: No agitation  ED Course / MDM  EKG:EKG Interpretation  Date/Time:  Monday January 05 2021 01:56:14 EDT Ventricular Rate:  124 PR Interval:  138 QRS Duration: 74 QT Interval:  302 QTC Calculation: 433 R Axis:   88 Text Interpretation: Sinus tachycardia Otherwise normal ECG Confirmed by 11-30-1999 [phone]) on 01/05/2021 4:06:29 AM  I have reviewed the labs performed to date as well as medications administered while in observation.  Recent changes in the last 24 hours include continues to await placement by social work.  Plan  Current plan is for social work disposition.  Reg Bircher is not under involuntary commitment.     Paul Pastures, MD 03/25/21 [phone]

## 2021-03-26 DIAGNOSIS — G40909 Epilepsy, unspecified, not intractable, without status epilepticus: Secondary | ICD-10-CM | POA: Diagnosis not present

## 2021-03-26 NOTE — ED Notes (Signed)
Patient resting comfortably. No s/s  of distress.  Sitter at bedside.  

## 2021-03-26 NOTE — ED Notes (Signed)
Patient has been alert this shift. Patient took AM medication. Patient ate breakfast and lunch. No aggression noted.

## 2021-03-27 DIAGNOSIS — G40909 Epilepsy, unspecified, not intractable, without status epilepticus: Secondary | ICD-10-CM | POA: Diagnosis not present

## 2021-03-27 NOTE — ED Notes (Signed)
Pt resting I will continue to monitor. 

## 2021-03-27 NOTE — ED Provider Notes (Signed)
Emergency Medicine Observation Re-evaluation Note  Paul Hendricks is a 30 y.o. male, seen on rounds today.  Pt initially presented to the ED for complaints of No chief complaint on file. Currently, the patient is resting.  Physical Exam  BP (!) 165/136 (BP Location: Right Arm)   Pulse (!) 125   Temp 97.7 F (36.5 C) (Axillary)   Resp 18   Ht 6\' 2"  (1.88 m)   Wt 91 kg   SpO2 97%   BMI 25.76 kg/m  Physical Exam Cardiovascular:     Rate and Rhythm: Normal rate.  Pulmonary:     Effort: Pulmonary effort is normal.  Skin:    General: Skin is warm.  Neurological:     Mental Status: He is alert.     ED Course / MDM  EKG:EKG Interpretation  Date/Time:  Monday January 05 2021 01:56:14 EDT Ventricular Rate:  124 PR Interval:  138 QRS Duration: 74 QT Interval:  302 QTC Calculation: 433 R Axis:   88 Text Interpretation: Sinus tachycardia Otherwise normal ECG Confirmed by 11-30-1999 [phone]) on 01/05/2021 4:06:29 AM  I have reviewed the labs performed to date as well as medications administered while in observation.  Recent changes in the last 24 hours include nothing.  Plan  Current plan is for group home placment.  Paul Hendricks is not under involuntary commitment.     [name], DO 03/27/21 [phone]

## 2021-03-27 NOTE — ED Notes (Signed)
Pt eat 85% of his breakfast

## 2021-03-27 NOTE — ED Notes (Signed)
Pt eat 100% of Lunch and 60% of his dinner.   Pt have an extra tray at the nursing station.Marland Kitchen

## 2021-03-28 NOTE — ED Provider Notes (Signed)
Emergency Medicine Observation Re-evaluation Note  Paul Hendricks is a 30 y.o. male, seen on rounds today.  Pt initially presented to the ED for complaints of Seizures Currently, the patient is waiting for placement.  Patient initially presented from a group home.  Patient had been noncompliant with his meds and did have a seizure  Physical Exam  BP (!) 165/136 (BP Location: Right Arm)   Pulse (!) 125   Temp 97.7 F (36.5 C) (Axillary)   Resp 18   Ht 1.88 m (6\' 2" )   Wt 91 kg   SpO2 97%   BMI 25.76 kg/m  Physical Exam General: Resting Cardiac: Regular rate Lungs: Breathing easily normal respiratory effort Psych: Sleeping, deferred  ED Course / MDM  EKG:EKG Interpretation  Date/Time:  Monday January 05 2021 01:56:14 EDT Ventricular Rate:  124 PR Interval:  138 QRS Duration: 74 QT Interval:  302 QTC Calculation: 433 R Axis:   88 Text Interpretation: Sinus tachycardia Otherwise normal ECG Confirmed by 11-30-1999 [phone]) on 01/05/2021 4:06:29 AM  I have reviewed the labs performed to date as well as medications administered while in observation.  Recent changes in the last 24 hours include no acute issues overnight.  Plan  Current plan is for group home placement.  Patient was initially seen in the ER after a seizure.  He was medically evaluated and cleared.  Group home refused to take the patient back  Maxmilian Trostel is not under involuntary commitment.     [name], MD 03/28/21 [phone]

## 2021-03-28 NOTE — ED Notes (Signed)
Pt did not take meds. Attempted to put them in soup and pt spit food out.

## 2021-03-29 DIAGNOSIS — G40909 Epilepsy, unspecified, not intractable, without status epilepticus: Secondary | ICD-10-CM | POA: Diagnosis not present

## 2021-03-29 NOTE — ED Provider Notes (Signed)
Emergency Medicine Observation Re-evaluation Note  Paul Hendricks is a 30 y.o. male, seen on rounds today.  Pt initially presented to the ED for complaints of Seizures Currently, the patient is resting comfortably.  Physical Exam  BP (!) 139/102 (BP Location: Right Arm)   Pulse (!) 110   Temp 97.7 F (36.5 C) (Axillary)   Resp 18   Ht 1.88 m (6\' 2" )   Wt 91 kg   SpO2 95%   BMI 25.76 kg/m  Physical Exam  ED Course / MDM  EKG:EKG Interpretation  Date/Time:  Monday January 05 2021 01:56:14 EDT Ventricular Rate:  124 PR Interval:  138 QRS Duration: 74 QT Interval:  302 QTC Calculation: 433 R Axis:   88 Text Interpretation: Sinus tachycardia Otherwise normal ECG Confirmed by 11-30-1999 [phone]) on 01/05/2021 4:06:29 AM  I have reviewed the labs performed to date as well as medications administered while in observation.  Recent changes in the last 24 hours include nothing.  Plan  Current plan is for placement.  Paul Hendricks is not under involuntary commitment.     [name], MD 03/29/21 [phone]

## 2021-03-30 DIAGNOSIS — G40909 Epilepsy, unspecified, not intractable, without status epilepticus: Secondary | ICD-10-CM | POA: Diagnosis not present

## 2021-03-30 NOTE — ED Provider Notes (Signed)
Emergency Medicine Observation Re-evaluation Note  Paul Hendricks is a 30 y.o. male, seen on rounds today.  Pt initially presented to the ED for complaints of Seizures Currently, the patient is waiting for placement.  Patient initially presented from a group home.  Patient had been noncompliant with his meds and did have a seizure.  Physical Exam  BP (!) 139/102 (BP Location: Right Arm)   Pulse (!) 110   Temp 97.7 F (36.5 C) (Axillary)   Resp 18   Ht 6\' 2"  (1.88 m)   Wt 91 kg   SpO2 95%   BMI 25.76 kg/m  Physical Exam General: Asleep Cardiac: Well perfused Lungs: Breathing easily normal respiratory effort Psych: Sleeping, deferred  ED Course / MDM  EKG:EKG Interpretation  Date/Time:  Monday January 05 2021 01:56:14 EDT Ventricular Rate:  124 PR Interval:  138 QRS Duration: 74 QT Interval:  302 QTC Calculation: 433 R Axis:   88 Text Interpretation: Sinus tachycardia Otherwise normal ECG Confirmed by 11-30-1999 [phone]) on 01/05/2021 4:06:29 AM  I have reviewed the labs performed to date as well as medications administered while in observation.  Recent changes in the last 24 hours include no acute issues overnight.  Plan  Current plan is for group home placement.  Patient was initially seen in the ER after a seizure.  He was medically evaluated and cleared.  Group home refused to take the patient back  Matej Sappenfield is not under involuntary commitment.       [name], MD 03/30/21 [phone]

## 2021-03-30 NOTE — Progress Notes (Signed)
CSW spoke with Paul Hendricks, she reported having a mix up with her referrals. She stated she will come see pt tomorrow around 12:30pm.    [name].Ermine Spofford, [name], LCSWA [name]  Transitions of Care Clinical Social Worker I Direct Dial: [phone]  Fax: [phone] [name]

## 2021-03-31 DIAGNOSIS — G40909 Epilepsy, unspecified, not intractable, without status epilepticus: Secondary | ICD-10-CM | POA: Diagnosis not present

## 2021-03-31 MED ORDER — LORAZEPAM 2 MG/ML IJ SOLN
1.0000 mg | Freq: Once | INTRAMUSCULAR | Status: AC
  Administered 2021-03-31: 1 mg via INTRAVENOUS

## 2021-03-31 MED ORDER — DIVALPROEX SODIUM 125 MG PO CSDR
1000.0000 mg | DELAYED_RELEASE_CAPSULE | Freq: Once | ORAL | Status: DC
  Filled 2021-03-31: qty 8

## 2021-03-31 NOTE — Progress Notes (Signed)
TOC CSW inquired about placement with the following group homes:  [facility]- left HIPPA compliant VM.  [facility] - Not licensed for IDD   [facility]- spoke with Dr Cheree Ditto , he is interested in pt , CSW sent e-mail to CC for her to send out referral.   [facility]- no response unable to leave VM.  [facility]- eft HIPPA compliant VM.   [name].Adysson Revelle, [name], LCSWA [name]  Transitions of Care Clinical Social Worker I Direct Dial: [phone]  Fax: [phone] [name]

## 2021-03-31 NOTE — ED Notes (Deleted)
Writer witnessed tonic clonic seizure for a duration of 2-3 minutes.  Patient became cyanotic.  Code called.

## 2021-03-31 NOTE — ED Notes (Signed)
Writer witnessed tonic clonic seizure for a duration of 2-3 minutes.  Patient became cyanotic.  Code called.

## 2021-03-31 NOTE — ED Notes (Signed)
Doctors notified of the patients current seizure

## 2021-03-31 NOTE — ED Notes (Signed)
Re-paged consult for call back to [facility] MD from [place] MD [phone]

## 2021-03-31 NOTE — ED Provider Notes (Addendum)
Called to room as patient having generalized seizure. Hx same. Seizure currently resolved. Ativan 1 mg iv. Pt postictal. Chest cta bil. No oral injury.   Patient intermittently not taking his meds/depakote, and last level noted to be low. RN indicates this evening he did take his meds, but had again been noted to refuse meds earlier in day. Additional depakote dose given now, 1000 mg po. Neurology consulted.   Recheck pt, breathing comfortably. No distress. On recheck is alert, at his baseline. No neck stiffness or rigidity. Chest cta. Abd soft nt.   Await neurology call back - re-paged x 2.   Repeat labs, cbg, depakote level pending - signed out to oncoming EDP, Dr Manus Gunning, to f/u w levels.  Neurology called back, discussed with Dr Kirtland Bouchard, reviewed recent labs, and indicates to give an extra 2 gm po depakote this evening, and resume prior dose.        Cathren Laine, MD 03/31/21 [phone]

## 2021-03-31 NOTE — ED Notes (Signed)
Patient has been alert this shift.  Medication given .  Patient ate breakfast and lunch.  Patient ambulatory.  No aggression or agitation noted.

## 2021-03-31 NOTE — Progress Notes (Signed)
CSW received call from Paul Hendricks , she stated her meeting ran long and now on her way to meet pt.   [name].Raynard Mapps, [name], LCSWA [name]  Transitions of Care Clinical Social Worker I Direct Dial: [phone]  Fax: [phone] [name]

## 2021-04-01 DIAGNOSIS — G40909 Epilepsy, unspecified, not intractable, without status epilepticus: Secondary | ICD-10-CM | POA: Diagnosis not present

## 2021-04-01 LAB — VALPROIC ACID LEVEL: Valproic Acid Lvl: 80 ug/mL (ref 50.0–100.0)

## 2021-04-01 NOTE — ED Provider Notes (Signed)
Emergency Medicine Observation Re-evaluation Note  Paul Hendricks is a 30 y.o. male, seen on rounds today.  Pt initially presented to the ED for complaints of Seizures Currently, the patient is resting.  Physical Exam  BP (!) 133/105 (BP Location: Left Arm)   Pulse (!) 115   Temp (!) 97.3 F (36.3 C) (Axillary)   Resp 20   Ht 6\' 2"  (1.88 m)   Wt 91 kg   SpO2 100%   BMI 25.76 kg/m  Physical Exam General: resting comfortably, NAD Lungs: normal WOB Psych: currently calm and resting  ED Course / MDM  EKG:EKG Interpretation  Date/Time:  Monday January 05 2021 01:56:14 EDT Ventricular Rate:  124 PR Interval:  138 QRS Duration: 74 QT Interval:  302 QTC Calculation: 433 R Axis:   88 Text Interpretation: Sinus tachycardia Otherwise normal ECG Confirmed by 11-30-1999 [phone]) on 01/05/2021 4:06:29 AM  I have reviewed the labs performed to date as well as medications administered while in observation.  Recent changes in the last 24 hours include seizure activity.  Plan  Current plan is for group home placement. Patient had seizure activity yesterday.  Patient is known to be noncompliant with antiepileptic medicine.  Neurology was consulted, recommended Depakote load which the patient took.  Repeat level is appropriate today.  Paul Hendricks is not under involuntary commitment.     [name], DO 04/01/21 13/09/22

## 2021-04-01 NOTE — Progress Notes (Addendum)
TOC CSW inquired about placement at the following group homes:  [facility]- called back today, no response unable to leave VM.  [facility]- spoke with [facility] white, he is interested, requesting referral sent.  CSW sent e-mail to pt's CC for her to send out referral.   [facility]- spoke with April Clementeen Graham she is interested, requesting referral sent.  CSW sent e-mail to pt's CC for her to send out referral.  [facility]- no response left HIPPA  compliant VM.     CSW received e-mail from CC [name] [name] about a possible acceptance for level 4 placement. She is waiting on a response from the [name] provider to verify if pt was truly accepted .  [place]  2:40pm CSW spoke with Sanjuana Mae from [employer], about a recent visit. Ms. Tommie Raymond stated she would like to come back to visit pt a few more times, to see his interaction. Pt was asleep during the ALF owner's visit. CSW sent an email out to pt's CC [name] [name] with an update.

## 2021-04-01 NOTE — ED Notes (Signed)
Pt eat 100% of his breakfast and lunch.

## 2021-04-01 NOTE — ED Notes (Signed)
Pt eat 100% of his food.  °

## 2021-04-02 DIAGNOSIS — G40909 Epilepsy, unspecified, not intractable, without status epilepticus: Secondary | ICD-10-CM | POA: Diagnosis not present

## 2021-04-02 NOTE — ED Notes (Signed)
Vitals refused.

## 2021-04-03 DIAGNOSIS — G40909 Epilepsy, unspecified, not intractable, without status epilepticus: Secondary | ICD-10-CM | POA: Diagnosis not present

## 2021-04-03 NOTE — ED Notes (Signed)
Patient had multiple outbursts this shift where he repeatedly stomped the floor and beat the glass with closed fists.  PRN's given as patient was not redirectable by staff.

## 2021-04-03 NOTE — ED Provider Notes (Signed)
Emergency Medicine Observation Re-evaluation Note  Paul Hendricks is a 30 y.o. male, seen on rounds today.  Pt initially presented to the ED for complaints of Seizures Currently, the patient is pending placement  Physical Exam  BP (!) 133/105 (BP Location: Left Arm)   Pulse (!) 115   Temp (!) 97.3 F (36.3 C) (Axillary)   Resp 20   Ht 6\' 2"  (1.88 m)   Wt 91 kg   SpO2 100%   BMI 25.76 kg/m  Physical Exam General: NAD Cardiac: Regular rate Lungs: No respiratory distress Psych: Stable  ED Course / MDM  EKG:EKG Interpretation  Date/Time:  Monday January 05 2021 01:56:14 EDT Ventricular Rate:  124 PR Interval:  138 QRS Duration: 74 QT Interval:  302 QTC Calculation: 433 R Axis:   88 Text Interpretation: Sinus tachycardia Otherwise normal ECG Confirmed by 11-30-1999 [phone]) on 01/05/2021 4:06:29 AM  I have reviewed the labs performed to date as well as medications administered while in observation.    Plan  Current plan is for group home placement Patient given PRN medications for sedation yesterday due to repeated violent outbursts, not directable by staff. Depakote load given 2 days ago  Paul Hendricks is not under involuntary commitment.     [name], MD 04/03/21 [phone]

## 2021-04-03 NOTE — ED Notes (Signed)
Patient has been sleeping this shift. No s/s of distress.  Patient took AM medication.  Pt ate breakfast.

## 2021-04-03 NOTE — ED Notes (Signed)
Pt eat 100% of his lunch.   

## 2021-04-04 DIAGNOSIS — G40909 Epilepsy, unspecified, not intractable, without status epilepticus: Secondary | ICD-10-CM | POA: Diagnosis not present

## 2021-04-04 NOTE — ED Notes (Signed)
Pt pacing around room, encouraged to lay down and sleep

## 2021-04-04 NOTE — ED Provider Notes (Signed)
Emergency Medicine Observation Re-evaluation Note  Taye Cato is a 30 y.o. male, seen on rounds today.  Pt initially presented to the ED for complaints of Seizures Currently, the patient is sleeping.  Physical Exam  BP (!) 133/105 (BP Location: Left Arm)   Pulse 91   Temp (!) 97.3 F (36.3 C) (Axillary)   Resp 20   Ht 6\' 2"  (1.88 m)   Wt 91 kg   SpO2 94%   BMI 25.76 kg/m  Physical Exam General: Sleeping Cardiac: Regular rate Lungs: Clear to auscultation bilaterally Psych: Calm and sleeping  ED Course / MDM  EKG:EKG Interpretation  Date/Time:  Monday January 05 2021 01:56:14 EDT Ventricular Rate:  124 PR Interval:  138 QRS Duration: 74 QT Interval:  302 QTC Calculation: 433 R Axis:   88 Text Interpretation: Sinus tachycardia Otherwise normal ECG Confirmed by 11-30-1999 [phone]) on 01/05/2021 4:06:29 AM  I have reviewed the labs performed to date as well as medications administered while in observation.  Recent changes in the last 24 hours include patient has been sleeping for the last few days and has not required any significant medications other than his normal meds.  Plan  Current plan is for placement.  Robbin Escher is not under involuntary commitment.     [name], MD 04/04/21 1424

## 2021-04-04 NOTE — ED Notes (Signed)
Pt awaking from nap. Pt nonverbal per baseline. Acting appropriately for baseline

## 2021-04-04 NOTE — ED Notes (Signed)
Pt refusing VS, vocalizing loudly

## 2021-04-04 NOTE — ED Notes (Signed)
Patient took AM medication. Patient has been alert.  Sleeping most of the shift. No aggression or agitation noted.

## 2021-04-05 DIAGNOSIS — G40909 Epilepsy, unspecified, not intractable, without status epilepticus: Secondary | ICD-10-CM | POA: Diagnosis not present

## 2021-04-05 NOTE — ED Notes (Signed)
Pt becoming very agitated, biting hand, yelling, hitting self. Security at bedside

## 2021-04-05 NOTE — ED Provider Notes (Signed)
Emergency Medicine Observation Re-evaluation Note  Paul Hendricks is a 30 y.o. male, seen on Hendricks today.  Pt initially presented to the ED for complaints of Seizures Currently, the patient is sleeping.  Physical Exam  BP (!) 133/105 (BP Location: Left Arm)   Pulse 91   Temp (!) 97.3 F (36.3 C) (Axillary)   Resp 20   Ht 6\' 2"  (1.88 m)   Wt 91 kg   SpO2 94%   BMI 25.76 kg/m  Physical Exam General: sleeping Cardiac: rrr Lungs: clear Psych: cooperative  ED Course / MDM  EKG:EKG Interpretation  Date/Time:  Monday January 05 2021 01:56:14 EDT Ventricular Rate:  124 PR Interval:  138 QRS Duration: 74 QT Interval:  302 QTC Calculation: 433 R Axis:   88 Text Interpretation: Sinus tachycardia Otherwise normal ECG Confirmed by 11-30-1999 [phone]) on 01/05/2021 4:06:29 AM  I have reviewed the labs performed to date as well as medications administered while in observation.  Recent changes in the last 24 hours include none.  Plan  Current plan is for still looking for placement.  Paul Hendricks is not under involuntary commitment.     Paul Pastures, MD 04/05/21 [phone]

## 2021-04-05 NOTE — Progress Notes (Signed)
Patient found in the bathroom vomiting. Patient refused PRN zofran. Pt went back to sleep after cleaning him up.

## 2021-04-06 DIAGNOSIS — G40909 Epilepsy, unspecified, not intractable, without status epilepticus: Secondary | ICD-10-CM | POA: Diagnosis not present

## 2021-04-06 NOTE — ED Notes (Signed)
Patient was alert at times this shift. Patient ate breakfast, lunch and dinner.  No aggression or agitation noted.    No s/s of distress.

## 2021-04-06 NOTE — Progress Notes (Signed)
TOC CSW inquired about placement at the following group homes:   [facility]- no response dose not go to VM.  [facility]- follow up about referral sent , no response left VM.   [facility]- no response left VM again.  [facility]- no available beds  [facility]- no  available beds.  [facility] [place]- provided contact information to staff for owner. Staff stated do not think there is space.    [facility]- call cannot go through.  [facility]-  no response Left VM   [facility]- no response Left VM  [facility]- no available beds  [facility]- # not in services   [place].Brit Wernette, [name], LCSWA [name]  Transitions of Care Clinical Social Worker I Direct Dial: [phone]  Fax: [phone] [name]

## 2021-04-07 DIAGNOSIS — G40909 Epilepsy, unspecified, not intractable, without status epilepticus: Secondary | ICD-10-CM | POA: Diagnosis not present

## 2021-04-07 NOTE — ED Notes (Signed)
Lendon scratched me on my hand he was upset

## 2021-04-07 NOTE — Progress Notes (Signed)
TOC CSW received a call from [employer].  [name] was inquiring about site visit on 04/06/2021 with Ms. Warren/[facility].  [name] also gave an update on the following:  [facility] [place]--Possible consideration  [facility]Dr. Grayling [place] consideration  Kateryn Marasigan Tarpley-Carter, [name], LCSW-A Pronouns:  She/Her/Hers [facility] Care Clinical Social Worker Direct Number:  [phone] [email or web address]

## 2021-04-07 NOTE — ED Provider Notes (Signed)
Emergency Medicine Observation Re-evaluation Note  Paul Hendricks is a 30 y.o. male, seen on rounds today.  Pt initially presented to the ED for complaints of Seizures Currently, the patient is calm.  Physical Exam  BP 109/67 (BP Location: Right Arm)   Pulse 89   Temp (!) 97.3 F (36.3 C) (Axillary)   Resp 16   Ht 6\' 2"  (1.88 m)   Wt 91 kg   SpO2 98%   BMI 25.76 kg/m  Physical Exam General: No acute distress Cardiac: Regular rate Lungs: Lungs are clear Psych: Calm and cooperative  ED Course / MDM  EKG:EKG Interpretation  Date/Time:  Monday January 05 2021 01:56:14 EDT Ventricular Rate:  124 PR Interval:  138 QRS Duration: 74 QT Interval:  302 QTC Calculation: 433 R Axis:   88 Text Interpretation: Sinus tachycardia Otherwise normal ECG Confirmed by 11-30-1999 [phone]) on 01/05/2021 4:06:29 AM  I have reviewed the labs performed to date as well as medications administered while in observation.  Recent changes in the last 24 hours include : No new changes.  Nursing staff have noted that patient has complained of some nausea.  No repeat labs in a while.  He is not vomiting.  He still tolerating p.o.  Will monitor closely.  Plan  Current plan is for patient to be observed in the ER while he is waiting for placement.  Paul Hendricks is not under involuntary commitment.     [name], MD 04/07/21 1024

## 2021-04-07 NOTE — Progress Notes (Signed)
TOC CSW inquired about placement at the following group homes:  [facility],- stated she has opening, CSW to e-mail CC  [name] [name].   [facility]- not contacted with [facility]  [facility]- Left VM  [employer] - no beds.  [facility]- left VM.  [name].Jaquelynn Wanamaker, [name], LCSWA [name]  Transitions of Care Clinical Social Worker I Direct Dial: [phone]  Fax: [phone] [name]

## 2021-04-07 NOTE — ED Notes (Signed)
Pt agitated, meds given per mar

## 2021-04-08 DIAGNOSIS — G40909 Epilepsy, unspecified, not intractable, without status epilepticus: Secondary | ICD-10-CM | POA: Diagnosis not present

## 2021-04-08 NOTE — ED Notes (Signed)
Patient has been sleeping this shift.  Patient took AM  medication. Patient ate lunch and drank fluids. Patient not aggression or agitated this shift.

## 2021-04-09 DIAGNOSIS — G40909 Epilepsy, unspecified, not intractable, without status epilepticus: Secondary | ICD-10-CM | POA: Diagnosis not present

## 2021-04-09 NOTE — ED Notes (Signed)
Patient has been drowsy and sleeping this shift. Patient has been agitated, needed redirection. Patient ate breakfast, lunch and dinner.

## 2021-04-09 NOTE — ED Notes (Signed)
Patient resting comfortably. No s/s  of distress.  Sitter at bedside.

## 2021-04-09 NOTE — Progress Notes (Signed)
CSW spoke with [facility] CC Val Eagle, she reported the owner of [facility], has not called her back. She reported she will reach out to [facility] owner Luanne Bras to follow up about last week's visit. She reported speaking with Mr Cheree Ditto from [facility] a couple of months ago about interested, she sent another referral, still waiting on call back. She reported she will  follow up with two other referral CSW sent out to her as well.     [name].Tiondra Fang, [name], LCSWA [name]  Transitions of Care Clinical Social Worker I Direct Dial: [phone]  Fax: [phone] [name]

## 2021-04-10 DIAGNOSIS — G40909 Epilepsy, unspecified, not intractable, without status epilepticus: Secondary | ICD-10-CM | POA: Diagnosis not present

## 2021-04-10 NOTE — ED Provider Notes (Signed)
  Physical Exam  BP (!) 116/93 (BP Location: Right Arm)   Pulse (!) 113   Temp (!) 97.3 F (36.3 C) (Axillary)   Resp 20   Ht 6\' 2"  (1.88 m)   Wt 91 kg   SpO2 95%   BMI 25.76 kg/m   Physical Exam  ED Course/Procedures     Procedures  MDM    Received care of patient from previous providers.  Please see their notes for prior history, physical and care.  Briefly this is a 30 year old male who had presented to the emergency department 10/23/2020 and has since been held for placement.  Per nursing no issues overnight.  He has been sleeping comfortably today.  Continues to await social work placement.     12/23/2020, MD 04/10/21 1537

## 2021-04-10 NOTE — Progress Notes (Signed)
CSW received e-mail from pt's CC in regards to placement.    *Caution - External email - see footer for warnings*   Great afternoon Team [name]; I am fowarding all the members of Team Terron this email I  just received from [place], Mr. Occupational psychologist.   The following information is what needs to be completed for Janyth Pupa' application for [employer] [facility] [place].  I plan to speak with Mr. Marcelline Mates and Cira Servant later this evening or sometime Saturday.  I should have better and clearer information for Team [place] on [place] [facility] day [name] ER stay documentation.  A large amount of calls/emails/site visit's have been preformed this week.  I have not had the chance to document them all.  I will have all of documentation related to [facility] submitted over the weekend.  Thanks to all the members of Team H. Rivera Colen for your understanding, support, patience and prayers.  I will be back in contact with you soon.  Have a great weekend. [name] [name], QP IDD Care Coordinator, [facility] LME-MCO [address] [place], [place]  [address] [phone] (office) Tuesday and Thursday (in the office) [phone] (cell)  Monday, Wednesday, and Friday (working from home) [phone] (fax)   [name].Harlee Eckroth, [name], LCSWA [name]  Transitions of Care Clinical Social Worker I Direct Dial: [phone]  Fax: [phone] [name]

## 2021-04-11 DIAGNOSIS — G40909 Epilepsy, unspecified, not intractable, without status epilepticus: Secondary | ICD-10-CM | POA: Diagnosis not present

## 2021-04-11 NOTE — ED Provider Notes (Signed)
Emergency Medicine Observation Re-evaluation Note  Paul Hendricks is a 30 y.o. male, seen on rounds today.  Pt initially presented to the ED for complaints of Seizures Currently, the patient is resting.  Physical Exam  BP 123/69 (BP Location: Left Arm)   Pulse (!) 105   Temp 97.9 F (36.6 C) (Axillary)   Resp 16   Ht 6\' 2"  (1.88 m)   Wt 91 kg   SpO2 97%   BMI 25.76 kg/m  Physical Exam General: resting comfortably, NAD Lungs: normal WOB Psych: currently calm and resting  ED Course / MDM  EKG:EKG Interpretation  Date/Time:  Monday January 05 2021 01:56:14 EDT Ventricular Rate:  124 PR Interval:  138 QRS Duration: 74 QT Interval:  302 QTC Calculation: 433 R Axis:   88 Text Interpretation: Sinus tachycardia Otherwise normal ECG Confirmed by 11-30-1999 [phone]) on 01/05/2021 4:06:29 AM  I have reviewed the labs performed to date as well as medications administered while in observation.  Recent changes in the last 24 hours include none.  Plan  Current plan is for pending placement by social work.  Paul Hendricks is not under involuntary commitment.     [name], DO 04/11/21 04/13/21

## 2021-04-11 NOTE — Progress Notes (Signed)
Patient agitated, drooling and holding his mouth. Cold water seems to help a little bit. Haldol and ativan given. Will continue to monitor.

## 2021-04-11 NOTE — Progress Notes (Signed)
Patient is very agitated , drooling and holding his mouth this morning. EDP informed. Waiting for orders.

## 2021-04-11 NOTE — Progress Notes (Signed)
Patientt remain agitated, no orders has been given. Cold water and ice chips seems to help him. Will continue to monitor.

## 2021-04-11 NOTE — ED Notes (Signed)
Patient just woke up and was given his lunch tray.

## 2021-04-11 NOTE — ED Notes (Addendum)
Patients bed sheets were changed while Paul Hendricks was helping patient shower. Patient was given juice as well.

## 2021-04-12 DIAGNOSIS — G40909 Epilepsy, unspecified, not intractable, without status epilepticus: Secondary | ICD-10-CM | POA: Diagnosis not present

## 2021-04-12 MED ORDER — LORAZEPAM 2 MG/ML IJ SOLN
2.0000 mg | Freq: Once | INTRAMUSCULAR | Status: AC
  Administered 2021-04-12: 2 mg via INTRAMUSCULAR
  Filled 2021-04-12: qty 1

## 2021-04-12 NOTE — ED Notes (Signed)
Pt increasingly agitated yelling, waving arms, and coming in and out of room. Security and [employer] at bedside. Given IM PRN agitation medications. Intermittently following instructions.

## 2021-04-12 NOTE — ED Notes (Signed)
Pt is finally resting. Noted deep bite mark to right hand

## 2021-04-12 NOTE — ED Provider Notes (Signed)
Emergency Medicine Observation Re-evaluation Note  Paul Hendricks is a 30 y.o. male, seen on rounds today.  Pt initially presented to the ED for complaints of Seizures Currently, the patient is sleeping.  Physical Exam  BP (!) 138/109   Pulse (!) 101   Temp (!) 97.4 F (36.3 C) (Axillary)   Resp 18   Ht 6\' 2"  (1.88 m)   Wt 91 kg   SpO2 97%   BMI 25.76 kg/m  Physical Exam General: asleep Cardiac: deferred, asleep Lungs: normal effort Psych: deferred, asleep  ED Course / MDM  EKG:EKG Interpretation  Date/Time:  Monday January 05 2021 01:56:14 EDT Ventricular Rate:  124 PR Interval:  138 QRS Duration: 74 QT Interval:  302 QTC Calculation: 433 R Axis:   88 Text Interpretation: Sinus tachycardia Otherwise normal ECG Confirmed by 11-30-1999 [phone]) on 01/05/2021 4:06:29 AM  I have reviewed the labs performed to date as well as medications administered while in observation.  Recent changes in the last 24 hours include lorazepam injection given a few hours ago.  Plan  Current plan is for placement.  Paul Hendricks is not under involuntary commitment.     [name], MD 04/12/21 [phone]

## 2021-04-12 NOTE — ED Notes (Addendum)
Pt agitated pacing back and forth from room. Appearance is disheveled. Pt speech incomprehensible, continues to have bursts of yelling. Attempted to communicate with pt if he needed anything. No response. Pt is able to follow instructions. Provided with PRN agitation medications.

## 2021-04-12 NOTE — ED Notes (Signed)
Pt agitation continues. Is now biting into his right hand. Appears to be uncomfortable due to oral pain. Attempted orajel administration without success. Pt refused. Pt provided with ice pop and ice chips to aid with pain.

## 2021-04-12 NOTE — ED Notes (Signed)
Pt agitation continues despite interventions and medications given. Pt is now banging walls. Concerns for pt/staff safety if agitation continues/worsens.

## 2021-04-13 DIAGNOSIS — G40909 Epilepsy, unspecified, not intractable, without status epilepticus: Secondary | ICD-10-CM | POA: Diagnosis not present

## 2021-04-13 NOTE — ED Provider Notes (Signed)
Emergency Medicine Observation Re-evaluation Note  Paul Hendricks is a 30 y.o. male, seen on rounds today.  Pt initially presented to the ED for complaints of Seizures Currently, the patient is resting.  Physical Exam  BP (!) 148/122 (BP Location: Left Arm) Comment: RN aware  Pulse (!) 135   Temp (!) 97.4 F (36.3 C) (Axillary)   Resp 20   Ht 6\' 2"  (1.88 m)   Wt 91 kg   SpO2 96%   BMI 25.76 kg/m  Physical Exam General: resting comfortably, NAD Lungs: normal WOB Psych: currently calm and resting  ED Course / MDM  EKG:EKG Interpretation  Date/Time:  Monday January 05 2021 01:56:14 EDT Ventricular Rate:  124 PR Interval:  138 QRS Duration: 74 QT Interval:  302 QTC Calculation: 433 R Axis:   88 Text Interpretation: Sinus tachycardia Otherwise normal ECG Confirmed by 11-30-1999 [phone]) on 01/05/2021 4:06:29 AM  I have reviewed the labs performed to date as well as medications administered while in observation.  Recent changes in the last 24 hours include none. Patient has been intermittently tachycardic, comfortable appearing on my exam, HR 102, eating and drinking without difficulty. Becomes more tachycardia when agitated and excited per staff notes.  Plan  Current plan is for placement.  Donel Osowski is not under involuntary commitment.     [name], Rozelle Logan 04/13/21 04/15/21

## 2021-04-13 NOTE — Progress Notes (Signed)
CSW spoke with pt's CC [name] worthy, she is requesting assistance with filling out prospective group home's application. Per CC prospective group home is requesting for pt to have a new psychological. CSW informed CC the hospital is unable to complete psychological. CSW informed CC when she is able to find someone to do it , they can do it by via tele health or in person.   CWS to assist with completing application to [facility] [place].   [name].Roshad Hack, [name], LCSWA [name]  Transitions of Care Clinical Social Worker I Direct Dial: [phone]  Fax: [phone] [name]

## 2021-04-14 DIAGNOSIS — G40909 Epilepsy, unspecified, not intractable, without status epilepticus: Secondary | ICD-10-CM | POA: Diagnosis not present

## 2021-04-14 NOTE — ED Provider Notes (Signed)
Emergency Medicine Observation Re-evaluation Note  Paul Hendricks is a 30 y.o. male, seen on rounds today.  Pt initially presented to the ED for complaints of Seizures Currently, the patient is calm.  Physical Exam  BP 120/87 (BP Location: Right Arm)   Pulse 100   Temp 97.7 F (36.5 C) (Axillary)   Resp 18   Ht 6\' 2"  (1.88 m)   Wt 91 kg   SpO2 98%   BMI 25.76 kg/m  Physical Exam General: awake and alert Cardiac: rrr Lungs: cta bilat Psych: calm  ED Course / MDM  EKG:EKG Interpretation  Date/Time:  Monday January 05 2021 01:56:14 EDT Ventricular Rate:  124 PR Interval:  138 QRS Duration: 74 QT Interval:  302 QTC Calculation: 433 R Axis:   88 Text Interpretation: Sinus tachycardia Otherwise normal ECG Confirmed by 11-30-1999 [phone]) on 01/05/2021 4:06:29 AM  I have reviewed the labs performed to date as well as medications administered while in observation.  Recent changes in the last 24 hours include none.  Plan  Current plan is for awaiting placement.  Paul Hendricks is not under involuntary commitment.     [name], MD 04/14/21 [phone]

## 2021-04-14 NOTE — Progress Notes (Signed)
CSW spoke with CC [name] [name] in regards to forms needed, she sated she will send forms when she gets out of meeting. CSW continue to follow.  [name].Clemence Lengyel, [name], LCSWA [name]  Transitions of Care Clinical Social Worker I Direct Dial: [phone]  Fax: [phone] [name]

## 2021-04-14 NOTE — ED Notes (Signed)
Patient brushed his teeth

## 2021-04-15 DIAGNOSIS — G40909 Epilepsy, unspecified, not intractable, without status epilepticus: Secondary | ICD-10-CM | POA: Diagnosis not present

## 2021-04-15 NOTE — Progress Notes (Signed)
Attempted to contact CC [name] [name] no response left VM.  Placement is still pending for this pt.    [name].Ariyanah Aguado, [name], LCSWA [name]  Transitions of Care Clinical Social Worker I Direct Dial: [phone]  Fax: [phone] [name]

## 2021-04-15 NOTE — ED Provider Notes (Signed)
Emergency Medicine Observation Re-evaluation Note  Paul Hendricks is a 30 y.o. male, seen on rounds today.  Pt initially presented to the ED for complaints of Seizures Currently, the patient is awake and resting in bed.  Physical Exam  BP (!) 136/101 (BP Location: Left Arm)   Pulse (!) 117   Temp 97.7 F (36.5 C) (Axillary)   Resp 18   Ht 6\' 2"  (1.88 m)   Wt 91 kg   SpO2 98%   BMI 25.76 kg/m  Physical Exam General: Nondistressed Cardiac: Extremities well perfused Lungs: Breathing is even and unlabored Psych: Does not appear to be responding to internal stimuli  ED Course / MDM  EKG:EKG Interpretation  Date/Time:  Monday January 05 2021 01:56:14 EDT Ventricular Rate:  124 PR Interval:  138 QRS Duration: 74 QT Interval:  302 QTC Calculation: 433 R Axis:   88 Text Interpretation: Sinus tachycardia Otherwise normal ECG Confirmed by 11-30-1999 [phone]) on 01/05/2021 4:06:29 AM  I have reviewed the labs performed to date as well as medications administered while in observation.  Recent changes in the last 24 hours include none.  Plan  Current plan is for placement.  Paul Hendricks is not under involuntary commitment.     [name], MD 04/15/21 [phone]

## 2021-04-16 DIAGNOSIS — G40909 Epilepsy, unspecified, not intractable, without status epilepticus: Secondary | ICD-10-CM | POA: Diagnosis not present

## 2021-04-16 NOTE — ED Notes (Signed)
Pt has not eaten any of the foods provided with medications in them. Pt becoming increasingly agitated

## 2021-04-16 NOTE — ED Notes (Signed)
Crushed more thorazine into applesauce. Pt eats some of the applesauce

## 2021-04-16 NOTE — ED Notes (Signed)
Pt vocalizing and moaning intermittently, without change to baseline status. Non verbal.

## 2021-04-16 NOTE — ED Notes (Signed)
All oral pt medications crushed to as fine a powder as possible and dispersed amongst drink and different cakes/snacks.

## 2021-04-16 NOTE — ED Provider Notes (Signed)
Emergency Medicine Observation Re-evaluation Note  Kell Ferris is a 30 y.o. male, seen on rounds today.  Pt initially presented to the ED for complaints of Seizures Currently, the patient is sleeping.  Physical Exam  BP (!) 126/95 (BP Location: Left Arm)   Pulse 95   Temp 97.7 F (36.5 C) (Axillary)   Resp 18   Ht 6\' 2"  (1.88 m)   Wt 91 kg   SpO2 95%   BMI 25.76 kg/m  Physical Exam General: Nondistressed Cardiac: Extremities well perfused Lungs: Breathing is even and unlabored Psych: Deferred  ED Course / MDM  EKG:EKG Interpretation  Date/Time:  Monday January 05 2021 01:56:14 EDT Ventricular Rate:  124 PR Interval:  138 QRS Duration: 74 QT Interval:  302 QTC Calculation: 433 R Axis:   88 Text Interpretation: Sinus tachycardia Otherwise normal ECG Confirmed by 11-30-1999 [phone]) on 01/05/2021 4:06:29 AM  I have reviewed the labs performed to date as well as medications administered while in observation.  Recent changes in the last 24 hours include none.  Plan  Current plan is for placement.  Norton Bivins is not under involuntary commitment.     [name], MD 04/16/21 1021

## 2021-04-16 NOTE — ED Notes (Signed)
Pt becoming increasingly restless and agitated. Pt offered food and drink

## 2021-04-16 NOTE — ED Notes (Signed)
Pt has been cooperative and redirectable since 7 am today.

## 2021-04-17 DIAGNOSIS — G40909 Epilepsy, unspecified, not intractable, without status epilepticus: Secondary | ICD-10-CM | POA: Diagnosis not present

## 2021-04-17 MED ORDER — KETOROLAC TROMETHAMINE 30 MG/ML IJ SOLN
15.0000 mg | Freq: Once | INTRAMUSCULAR | Status: AC
  Administered 2021-04-17: 15 mg via INTRAMUSCULAR
  Filled 2021-04-17: qty 1

## 2021-04-17 NOTE — ED Notes (Signed)
Pt care tech nathalia calls this RN over to pt room where she states pt took off blanket, got up out of bed and laid down prone on the floor. Upon my arrival, pt is prone on the floor with arms tucked between floor and chest, with legs flexed 90 degrees at knees, with shaking noted to legs. This would go on approximately 3-4 seconds, then legs would relax down to floor for 3-4 seconds, then back up to flexion. This repeats 4 times. Pt heard to be breathing, but with assistance this RN assists pt to roll onto side for airway protection. At this point, pt opens eyes and appears groggy. Pt follows commands and gets himself back into bed. Pt looking around room as if not sure where he is. -incontinence or oral trauma noted. Per tech, no witnessed head trauma. Pt did receive oral seizure medications on this shift

## 2021-04-17 NOTE — ED Notes (Signed)
As observing Pt. asleep, all of a sudden he took blankets off and placed himself on the floor, face down. Began to move feet up and down in a rhythmic manner. Instructed pt. to get up. Called nurse for help. When nurse in room, Pt. Got himself back to bed. Vitals obtained.

## 2021-04-17 NOTE — ED Provider Notes (Signed)
Emergency Medicine Observation Re-evaluation Note  Zahi Plaskett is a 30 y.o. male, seen on rounds today.  Pt initially presented to the ED for complaints of Seizures Currently, the patient is sleeping.  Physical Exam  BP (!) 143/105 (BP Location: Right Arm)   Pulse (!) 106   Temp 97.7 F (36.5 C) (Axillary)   Resp 18   Ht 6\' 2"  (1.88 m)   Wt 91 kg   SpO2 96%   BMI 25.76 kg/m  Physical Exam General: Nondistressed Cardiac: Extremities well perfused Lungs: Breathing is even and unlabored Psych: resting comfortably  ED Course / MDM  EKG:EKG Interpretation  Date/Time:  Monday January 05 2021 01:56:14 EDT Ventricular Rate:  124 PR Interval:  138 QRS Duration: 74 QT Interval:  302 QTC Calculation: 433 R Axis:   88 Text Interpretation: Sinus tachycardia Otherwise normal ECG Confirmed by 11-30-1999 [phone]) on 01/05/2021 4:06:29 AM  I have reviewed the labs performed to date as well as medications administered while in observation.  Recent changes in the last 24 hours include none.  Plan  Current plan is for placement.  Shey Bartmess is not under involuntary commitment.        [name], DO 04/17/21 [phone]

## 2021-04-17 NOTE — ED Notes (Signed)
Pt resting comfortably. Rise and fall of the chest noted.

## 2021-04-17 NOTE — NC FL2 (Signed)
[place] LEVEL OF CARE SCREENING TOOL     IDENTIFICATION  Patient Name: Paul Hendricks Birthdate: [birth year] Sex: male Admission Date (Current Location): 10/23/2020  [facility] and [place] Number:  Herbalist and Address:  [facility],  [place] [address], [place]      Provider Number: [id number]  Attending Physician Name and Address:  Default, Provider, MD  Relative Name and Phone Number:  [name] (Mother)   [phone]    Current Level of Care: Hospital Recommended Level of Care: Other (Comment) ([name]) Prior Approval Number:    Date Approved/Denied:   PASRR Number: pending  Discharge Plan: Other (Comment) ([name])    Current Diagnoses: Patient Active Problem List   Diagnosis Date Noted   Acute appendicitis 05/23/2019   Acute constipation 10/10/2018   Atopic dermatitis 10/10/2018   Hypertriglyceridemia 01/12/2017   Epilepsy, generalized, 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   Severe intellectual disability 04/22/2015   Autistic disorder 04/22/2015   Bipolar disorder ([place]) 03/05/2015   Impacted teeth with abnormal position 12/23/2014    Orientation RESPIRATION BLADDER Height & Weight     Self, Situation (Within definded Limits)  Normal Incontinent Weight: 200 lb 9.9 oz (91 kg) Height:  _0  (188 cm)  BEHAVIORAL SYMPTOMS/MOOD NEUROLOGICAL BOWEL NUTRITION STATUS    Convulsions/Seizures Incontinent Diet (chopped)  AMBULATORY STATUS COMMUNICATION OF NEEDS Skin   Independent Non-Verbally Normal                       Personal Care Assistance Level of Assistance  Bathing, Feeding, Dressing Bathing Assistance: Limited assistance Feeding assistance: Independent Dressing Assistance: Independent     Functional Limitations Info  Sight, Hearing, Speech Sight Info: Adequate Hearing Info: Adequate Speech Info: Impaired (non verbal)    SPECIAL CARE FACTORS FREQUENCY                       Contractures  Contractures Info: Not present    Additional Factors Info  Code Status, Allergies, Psychotropic Code Status Info: full Allergies Info: No Known Allergies Psychotropic Info: risperdal, geodon, Ativan         Current Medications (04/17/2021):  This is the current hospital active medication list Current Facility-Administered Medications  Medication Dose Route Frequency Provider Last Rate Last Admin   acetaminophen (TYLENOL) tablet 650 mg  650 mg Oral Q4H PRN Carlisle Cater, PA-C   650 mg at 04/12/21 0426   alum & mag hydroxide-simeth (MAALOX/MYLANTA) 200-200-20 MG/5ML suspension 30 mL  30 mL Oral Q6H PRN Carlisle Cater, PA-C       benzocaine (ORAJEL) 10 % mucosal gel   Mouth/Throat BID PRN Tegeler, Gwenyth Allegra, MD   Given at 03/21/21 0156   chlorhexidine gluconate (MEDLINE KIT) (PERIDEX) 0.12 % solution 15 mL  15 mL Mouth/Throat QID Regan Lemming, MD   15 mL at 03/22/21 0441   chlorproMAZINE (THORAZINE) tablet 50 mg  50 mg Oral TID Suella Broad, FNP   50 mg at 04/16/21 2052   cholecalciferol (VITAMIN D3) tablet 1,000 Units  1,000 Units Oral Daily Daleen Bo, MD   1,000 Units at 04/16/21 0950   clonazePAM (KLONOPIN) tablet 1 mg  1 mg Oral TID PRN Carlisle Cater, PA-C   1 mg at 04/17/21 1043   diphenhydrAMINE (BENADRYL) capsule 25 mg  25 mg Oral BID Quintella Reichert, MD   25 mg at 04/16/21 2053   Or   diphenhydrAMINE (BENADRYL) injection 25 mg  25 mg Intramuscular  BID Quintella Reichert, MD   25 mg at 04/14/21 1028   divalproex (DEPAKOTE SPRINKLE) capsule 1,000 mg  1,000 mg Oral Q12H Polly Cobia, RPH   1,000 mg at 04/16/21 2000   divalproex (DEPAKOTE SPRINKLE) capsule 1,000 mg  1,000 mg Oral Once Lajean Saver, MD       divalproex (DEPAKOTE SPRINKLE) capsule 1,000 mg  1,000 mg Oral Once Lajean Saver, MD       furosemide (LASIX) tablet 20 mg  20 mg Oral Daily Carlisle Cater, PA-C   20 mg at 04/16/21 0955   guanFACINE (TENEX) tablet 2 mg  2 mg Oral QHS Carlisle Cater, PA-C   2  mg at 04/16/21 2053   haloperidol lactate (HALDOL) injection 5 mg  5 mg Intramuscular Q6H PRN Truddie Hidden, MD   5 mg at 04/16/21 2154   ibuprofen (ADVIL) tablet 400 mg  400 mg Oral QID Molpus, John, MD   400 mg at 04/16/21 2053   LORazepam (ATIVAN) injection 2 mg  2 mg Intramuscular BID PRN Suella Broad, FNP   2 mg at 04/17/21 1349   melatonin tablet 10 mg  10 mg Oral QHS Varney Biles, MD   10 mg at 04/16/21 2053   mirtazapine (REMERON) tablet 15 mg  15 mg Oral QHS Carlisle Cater, PA-C   15 mg at 04/16/21 2052   naltrexone (DEPADE) tablet 25 mg  25 mg Oral BID Carlisle Cater, PA-C   25 mg at 04/16/21 2053   OLANZapine zydis (ZYPREXA) disintegrating tablet 10 mg  10 mg Oral BID PRN Deno Etienne, DO   10 mg at 04/15/21 2030   omega-3 acid ethyl esters (LOVAZA) capsule 1 g  1 g Oral Daily Carlisle Cater, PA-C   1 g at 04/16/21 0957   ondansetron (ZOFRAN) tablet 4 mg  4 mg Oral Q8H PRN Carlisle Cater, PA-C   4 mg at 03/30/21 0016   polyethylene glycol (MIRALAX / GLYCOLAX) packet 17 g  17 g Oral BID Davonna Belling, MD   17 g at 04/16/21 0956   risperiDONE (RISPERDAL M-TABS) disintegrating tablet 1 mg  1 mg Oral BID Suella Broad, FNP   1 mg at 04/16/21 2052   topiramate (TOPAMAX) tablet 25 mg  25 mg Oral BID Carlisle Cater, PA-C   25 mg at 04/16/21 4403   ziprasidone (GEODON) injection 20 mg  20 mg Intramuscular Once Deno Etienne, DO       Current Outpatient Medications  Medication Sig Dispense Refill   chlorproMAZINE (THORAZINE) 50 MG tablet Take 1 tablet (50 mg total) by mouth 3 (three) times daily as needed. 90 tablet 0   clonazePAM (KLONOPIN) 1 MG tablet Take 1 mg by mouth in the morning, at noon, and at bedtime. (0800, 1600 & 2000)     divalproex (DEPAKOTE ER) 500 MG 24 hr tablet Take 1 tablet in AM, 3 tablets in PM (Patient taking differently: Take 500-1,500 mg by mouth See admin instructions. Take 1 tablet (500 mg) by mouth in the morning & take 3 tablets (1500 mg) by  mouth in the evening.) 120 tablet 11   docusate sodium (COLACE) 100 MG capsule Take 200 mg by mouth at bedtime.     hydrOXYzine (ATARAX/VISTARIL) 25 MG tablet Take 25 mg by mouth 3 (three) times daily as needed for anxiety.     megestrol (MEGACE) 40 MG tablet Take 40 mg by mouth in the morning, at noon, and at bedtime.     mirtazapine (REMERON)  15 MG tablet Take 1 tablet (15 mg total) by mouth at bedtime. 30 tablet 0   omega-3 acid ethyl esters (LOVAZA) 1 g capsule Take 1 capsule (1 g total) by mouth daily. 30 capsule 0   Omega-3 Fatty Acids (FISH OIL) 1000 MG CAPS Take 1,000 mg by mouth 2 (two) times daily.     polyethylene glycol powder (GLYCOLAX/MIRALAX) 17 GM/SCOOP powder Take 17 g by mouth daily.     risperiDONE (RISPERDAL) 1 MG tablet Take 1 tablet (1 mg total) by mouth 2 (two) times daily. 60 tablet 0   atorvastatin (LIPITOR) 20 MG tablet Take 1 tablet (20 mg total) by mouth daily. (0800) 30 tablet 0   furosemide (LASIX) 20 MG tablet Take 1 tablet (20 mg total) by mouth daily. 30 tablet 0   guanFACINE (TENEX) 2 MG tablet Take 1 tablet (2 mg total) by mouth at bedtime. 30 tablet 0   Melatonin 5 MG SUBL Place 2 tablets (10 mg total) under the tongue at bedtime. 30 tablet 0   naltrexone (DEPADE) 50 MG tablet Take 0.5 tablets (25 mg total) by mouth in the morning and at bedtime. 15 tablet 0   topiramate (TOPAMAX) 25 MG tablet Take 1 tablet (25 mg total) by mouth 2 (two) times daily. 60 tablet 0     Discharge Medications: Please see discharge summary for a list of discharge medications.  Relevant Imaging Results:  Relevant Lab Results:   Additional Information SSN:[id number]  [place], LCSW

## 2021-04-17 NOTE — ED Notes (Signed)
Patient resting comfortably

## 2021-04-17 NOTE — ED Notes (Signed)
Patient upset most of shift, calling out. No aggression  noted. Patient difficult to redirect.  Patient did not take AM medication.  PRNs given (see MAR) .   PRN ativan 2 mg IM given and effective.

## 2021-04-18 DIAGNOSIS — G40909 Epilepsy, unspecified, not intractable, without status epilepticus: Secondary | ICD-10-CM | POA: Diagnosis not present

## 2021-04-18 NOTE — ED Notes (Signed)
Pt. Awake and eating cake and cup of juice.

## 2021-04-18 NOTE — ED Provider Notes (Signed)
Emergency Medicine Observation Re-evaluation Note  Paul Hendricks is a 30 y.o. male, seen on rounds today.  Pt initially presented to the ED for complaints of Seizures Currently, the patient is resting.  Physical Exam  BP (!) 143/105 (BP Location: Right Arm)   Pulse (!) 106   Temp 97.7 F (36.5 C) (Axillary)   Resp 18   Ht 6\' 2"  (1.88 m)   Wt 91 kg   SpO2 96%   BMI 25.76 kg/m  Physical Exam General: NAD, resting comfortably Cardiac: Extremities well perfused Lungs: Breathing is even and unlabored, equal chest rise noted Psych: resting comfortably, not acutely psychotic   ED Course / MDM  EKG:EKG Interpretation  Date/Time:  Monday January 05 2021 01:56:14 EDT Ventricular Rate:  124 PR Interval:  138 QRS Duration: 74 QT Interval:  302 QTC Calculation: 433 R Axis:   88 Text Interpretation: Sinus tachycardia Otherwise normal ECG Confirmed by 11-30-1999 [phone]) on 01/05/2021 4:06:29 AM  I have reviewed the labs performed to date as well as medications administered while in observation.  Recent changes in the last 24 hours include none.  Plan  Current plan is for placement.  Paul Hendricks is not under involuntary commitment.     Paul Pastures, DO 04/18/21 [phone]

## 2021-04-18 NOTE — ED Notes (Addendum)
Pt has been cooperative and redirectable since 7am. Pt makes periodic loud vocalizations. Pt took divalproex 1,000 mg and omega-3 1 g mixed in a brownie. Pt drank miralax in cranberry juice. Pt refused all other medications mixed in foods.

## 2021-04-18 NOTE — ED Notes (Signed)
Pt walked to locked BH area with this sitter and security. Pt walked in [facility] area for about and returned to [facility] with out incident.

## 2021-04-19 DIAGNOSIS — G40909 Epilepsy, unspecified, not intractable, without status epilepticus: Secondary | ICD-10-CM | POA: Diagnosis not present

## 2021-04-19 NOTE — ED Provider Notes (Signed)
Emergency Medicine Observation Re-evaluation Note  Paul Hendricks is Hendricks 30 y.o. male, seen on rounds today.  Pt initially presented to the ED for complaints of Seizures Currently, the patient is resting.  Physical Exam  BP 97/79 (BP Location: Right Arm)   Pulse 96   Temp 97.7 F (36.5 C) (Axillary)   Resp 20   Ht 6\' 2"  (1.88 m)   Wt 91 kg   SpO2 96%   BMI 25.76 kg/m  Physical Exam General: NAD, sitting comfortably Cardiac: Extremities well perfused Lungs: Breathing is even and unlabored, equal chest rise b/l Psych: resting comfortably, calm  ED Course / MDM  EKG:EKG Interpretation  Date/Time:  Monday January 05 2021 01:56:14 EDT Ventricular Rate:  124 PR Interval:  138 QRS Duration: 74 QT Interval:  302 QTC Calculation: 433 R Axis:   88 Text Interpretation: Sinus tachycardia Otherwise normal ECG Confirmed by 11-30-1999 [phone]) on 01/05/2021 4:06:29 AM  I have reviewed the labs performed to date as well as medications administered while in observation.  Recent changes in the last 24 hours include brief episode of agitation this morning while performing hygiene.  Plan  Current plan is for placement.  Paul Hendricks is not under involuntary commitment.     Paul Pastures A, DO 04/19/21 1012
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
## 2021-04-19 NOTE — ED Notes (Signed)
Multiple staff members continuing to attempt to deescalate pt and keep pt and staff safe.
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
## 2021-04-19 NOTE — ED Notes (Addendum)
Pt. Awake and in bathroom and no breathing difficulties.

## 2021-04-19 NOTE — ED Notes (Signed)
Pt sleeping in bed at this time, sitter at bedside 1:1
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
## 2021-04-19 NOTE — ED Notes (Addendum)
Pt lying down on bed and appears calm. Pt making periodic vocalizations.

## 2021-04-19 NOTE — ED Notes (Signed)
Pt making loud vocalizations and pacing around room. Pt vocalizations increasing in volume and consistency. Pt attempting to exit the room multiple times. Pt repeatedly biting his right hand. Attempted to give pt olanzapine and chlorpromazine mixed in peanut butter and crackers. Pt waived away medication.

## 2021-04-19 NOTE — ED Notes (Signed)
Pt making loud vocalizations and pacing around room.

## 2021-04-19 NOTE — ED Notes (Signed)
Patient ran from his room into the community bathroom. Per sitter, patient had an episode of incontinence in which he urinated and had a large, soft bowel movement. While staff was attempting to get patient into the shower, patient continued to turn the water off every time the shower was turned on. Patient noted to be biting his hand and verbally stimming in discomfort. Patient became agitated and began attempting to grab at this writer and other staff when staff attempted to clean the patient with incontinence cleanser. Patient was successful in grabbing this Clinical research associate, and Software engineer. Security called to bathroom. Patient assisted into the shower and was assisted in bathing by this Teacher, early years/pre. Patient cleaned, bed cleaned and linen changed, and floor cleaned and mopped. Assisted patient with putting on his pants, and patient was directed back to his room. Patient currently sitting on the bed, calm at this time.

## 2021-04-20 DIAGNOSIS — G40909 Epilepsy, unspecified, not intractable, without status epilepticus: Secondary | ICD-10-CM | POA: Diagnosis not present

## 2021-04-20 NOTE — Progress Notes (Signed)
TOC CSW spoke with pt's CC  [name] [name] in reference to pt application to [facility], she informed CSW of what pages she needs assistance with. She reported pt will need a TB skin , COVID vaccine ,and two weeks of medications prior to d/c .   [name].Brayla Pat, [name], LCSWA [name]  Transitions of Care Clinical Social Worker I Direct Dial: [phone]  Fax: [phone] [name]

## 2021-04-20 NOTE — ED Notes (Signed)
Patient alert this shift. Medication was taken during shift. PRNs given (see MAR).d/t patient being upset and making loud noises.  Patient redirectable . No aggression noted. Pt ate breakfast and lunch.

## 2021-04-20 NOTE — ED Notes (Signed)
Pt sleeping s/p ativan and haldol injections. VSS

## 2021-04-20 NOTE — ED Notes (Signed)
Pt continues to be sleeping s/p behavioral meds

## 2021-04-21 DIAGNOSIS — G40909 Epilepsy, unspecified, not intractable, without status epilepticus: Secondary | ICD-10-CM | POA: Diagnosis not present

## 2021-04-21 MED ORDER — COVID-19 MRNA VAC-TRIS(PFIZER) 30 MCG/0.3ML IM SUSP
0.3000 mL | Freq: Once | INTRAMUSCULAR | Status: AC
  Administered 2021-04-22: 0.3 mL via INTRAMUSCULAR
  Filled 2021-04-21: qty 0.3

## 2021-04-21 NOTE — ED Provider Notes (Signed)
Emergency Medicine Observation Re-evaluation Note  Paul Hendricks is a 30 y.o. male, seen on rounds today.  Patient has been in the ED now for over 4000 hours.  Plan is to attempt for group home placement Physical Exam  BP (!) 129/95 (BP Location: Right Arm)   Pulse (!) 105   Temp 98.2 F (36.8 C) (Axillary)   Resp 20   Ht 1.88 m (6\' 2" )   Wt 91 kg   SpO2 99%   BMI 25.76 kg/m  Physical Exam General: Calm, resting Cardiac: Mild tachycardia Lungs: Breathing easily Psych: Deferred  ED Course / MDM  EKG:EKG Interpretation  Date/Time:  Monday January 05 2021 01:56:14 EDT Ventricular Rate:  124 PR Interval:  138 QRS Duration: 74 QT Interval:  302 QTC Calculation: 433 R Axis:   88 Text Interpretation: Sinus tachycardia Otherwise normal ECG Confirmed by 11-30-1999 [phone]) on 01/05/2021 4:06:29 AM  I have reviewed the labs performed to date as well as medications administered while in observation.  Recent changes in the last 24 hours include no acute changes.  Plan  Current plan is for possible group home placement.  Patient's [facility] the [facility].  Would anticipate him continuing those medications at the group home  Paul Hendricks is not under involuntary commitment.     [name], MD 04/21/21 [phone]

## 2021-04-21 NOTE — Progress Notes (Signed)
TOC CSW emailed pt's FL2 , med list and completed sections of pt's [facility] app back to [employer].   [name].Aleene Swanner, [name], LCSWA [name]  Transitions of Care Clinical Social Worker I Direct Dial: [phone]  Fax: [phone] [name]

## 2021-04-22 DIAGNOSIS — G40909 Epilepsy, unspecified, not intractable, without status epilepticus: Secondary | ICD-10-CM | POA: Diagnosis not present

## 2021-04-22 LAB — VALPROIC ACID LEVEL: Valproic Acid Lvl: 84 ug/mL (ref 50.0–100.0)

## 2021-04-22 NOTE — Progress Notes (Signed)
[facility] will do virtual tomorrow at 1pm .    [name].Daune Divirgilio, [name], LCSWA [name]  Transitions of Care Clinical Social Worker I Direct Dial: [phone]  Fax: [phone] [name]

## 2021-04-23 DIAGNOSIS — Z20822 Contact with and (suspected) exposure to covid-19: Secondary | ICD-10-CM | POA: Diagnosis not present

## 2021-04-23 DIAGNOSIS — Z79899 Other long term (current) drug therapy: Secondary | ICD-10-CM | POA: Diagnosis not present

## 2021-04-23 DIAGNOSIS — F840 Autistic disorder: Secondary | ICD-10-CM | POA: Diagnosis not present

## 2021-04-23 DIAGNOSIS — G40909 Epilepsy, unspecified, not intractable, without status epilepticus: Secondary | ICD-10-CM | POA: Diagnosis not present

## 2021-04-23 DIAGNOSIS — R569 Unspecified convulsions: Secondary | ICD-10-CM | POA: Diagnosis present

## 2021-04-23 NOTE — ED Provider Notes (Signed)
Emergency Medicine Observation Re-evaluation Note  Paul Hendricks is a 30 y.o. male, seen on rounds today.  Pt initially presented to the ED for complaints of Seizures Currently, the patient is rocking in the bed but is directable and nonviolent.  Physical Exam  BP (!) 129/95 (BP Location: Right Arm)   Pulse (!) 105   Temp 98.2 F (36.8 C) (Axillary)   Resp 20   Ht 6\' 2"  (1.88 m)   Wt 91 kg   SpO2 99%   BMI 25.76 kg/m  Physical Exam General: No acute distress Cardiac: Tachycardic Lungs: Clear Psych: Mentally delayed but able to follow directions and cooperative  ED Course / MDM  EKG:EKG Interpretation  Date/Time:  Monday January 05 2021 01:56:14 EDT Ventricular Rate:  124 PR Interval:  138 QRS Duration: 74 QT Interval:  302 QTC Calculation: 433 R Axis:   88 Text Interpretation: Sinus tachycardia Otherwise normal ECG Confirmed by 11-30-1999 [phone]) on 01/05/2021 4:06:29 AM  I have reviewed the labs performed to date as well as medications administered while in observation.  Recent changes in the last 24 hours include none.  Plan  Current plan is for still seeking group home placement.  Keevan Wolz is not under involuntary commitment.     [name], MD 04/23/21 1052

## 2021-04-23 NOTE — ED Notes (Signed)
Patient alert this shift. Nonverbal. Patient refused AM medication. Redirectable, No aggression noted.

## 2021-04-24 DIAGNOSIS — G40909 Epilepsy, unspecified, not intractable, without status epilepticus: Secondary | ICD-10-CM | POA: Diagnosis not present

## 2021-04-24 LAB — QUANTIFERON-TB GOLD PLUS (RQFGPL)
QuantiFERON Mitogen Value: >10 [IU]/mL
QuantiFERON Nil Value: 0.02 [IU]/mL
QuantiFERON TB1 Ag Value: 0.06 [IU]/mL
QuantiFERON TB2 Ag Value: 0.05 [IU]/mL

## 2021-04-24 LAB — QUANTIFERON-TB GOLD PLUS: QuantiFERON-TB Gold Plus: NEGATIVE

## 2021-04-24 NOTE — ED Notes (Signed)
Pt has been cooperative, cooperative, and easily redirectable from 7 am to present.

## 2021-04-24 NOTE — ED Provider Notes (Signed)
Emergency Medicine Observation Re-evaluation Note  Paul Hendricks is a 30 y.o. male, seen on rounds today.  Pt initially presented to the ED for complaints of Seizures Currently, the patient is awake, calm.  Physical Exam  BP 131/84 (BP Location: Right Arm)   Pulse 96   Temp 97.8 F (36.6 C) (Oral)   Resp 18   Ht 6\' 2"  (1.88 m)   Wt 91 kg   SpO2 100%   BMI 25.76 kg/m  Physical Exam Constitutional:      General: He is not in acute distress.    Appearance: He is not ill-appearing.  HENT:     Head: Normocephalic.     Mouth/Throat:     Mouth: Mucous membranes are moist.  Pulmonary:     Effort: Pulmonary effort is normal.  Musculoskeletal:     Cervical back: Normal range of motion.  Skin:    Capillary Refill: Capillary refill takes less than 2 seconds.  Neurological:     General: No focal deficit present.     Mental Status: He is alert.  Psychiatric:        Mood and Affect: Mood normal.     ED Course / MDM  EKG:EKG Interpretation  Date/Time:  Monday January 05 2021 01:56:14 EDT Ventricular Rate:  124 PR Interval:  138 QRS Duration: 74 QT Interval:  302 QTC Calculation: 433 R Axis:   88 Text Interpretation: Sinus tachycardia Otherwise normal ECG Confirmed by 11-30-1999 [phone]) on 01/05/2021 4:06:29 AM  I have reviewed the labs performed to date as well as medications administered while in observation.  Recent changes in the last 24 hours include nothing.  Plan  Current plan is for [facility] home placement.  Paul Hendricks is not under involuntary commitment.     [name], DO 04/24/21 [phone]

## 2021-04-24 NOTE — Progress Notes (Signed)
Screening with [facility] was completed .   [name].[name], [name], LCSWA [name]  Transitions of Care Clinical Social Worker I Direct Dial: [phone]  Fax: [phone] [name]

## 2021-04-25 DIAGNOSIS — G40909 Epilepsy, unspecified, not intractable, without status epilepticus: Secondary | ICD-10-CM | POA: Diagnosis not present

## 2021-04-25 NOTE — ED Provider Notes (Signed)
Emergency Medicine Observation Re-evaluation Note  Paul Hendricks is a 30 y.o. male, seen on rounds today.  Pt initially presented to the ED for complaints of Seizures Currently, the patient is awaiting group home placement.  Physical Exam  BP 131/84 (BP Location: Right Arm)   Pulse 96   Temp 97.8 F (36.6 C) (Oral)   Resp 18   Ht 1.88 m (6\' 2" )   Wt 91 kg   SpO2 100%   BMI 25.76 kg/m  Physical Exam General: Resting Cardiac:  Lungs: No respiratory distress Psych: Resting  ED Course / MDM  EKG:EKG Interpretation  Date/Time:  Monday January 05 2021 01:56:14 EDT Ventricular Rate:  124 PR Interval:  138 QRS Duration: 74 QT Interval:  302 QTC Calculation: 433 R Axis:   88 Text Interpretation: Sinus tachycardia Otherwise normal ECG Confirmed by 11-30-1999 [phone]) on 01/05/2021 4:06:29 AM  I have reviewed the labs performed to date as well as medications administered while in observation.  Recent changes in the last 24 hours include nothing.  Plan  Current plan is for still awaiting group home placement.  Paul Hendricks is not under involuntary commitment.     [name], MD 04/25/21 [phone]

## 2021-04-25 NOTE — ED Notes (Signed)
Pt has been cooperative, cooperative, and easily redirectable from 7 am to present.

## 2021-04-26 DIAGNOSIS — G40909 Epilepsy, unspecified, not intractable, without status epilepticus: Secondary | ICD-10-CM | POA: Diagnosis not present

## 2021-04-26 NOTE — ED Notes (Signed)
Patient resting comfortably.   No s/s of distress.  

## 2021-04-26 NOTE — ED Notes (Signed)
Patient alert this shift. Patient non verbal. Patient making noises this shift. No aggression or agitation noted.  Patient took medication this shift. No s/s of distress.

## 2021-04-26 NOTE — ED Notes (Signed)
Patient forced himself to vomit after eating cupcake with medications inside.

## 2021-04-26 NOTE — ED Provider Notes (Signed)
Emergency Medicine Observation Re-evaluation Note  Paul Hendricks is a 30 y.o. male, seen on rounds today.  Pt initially presented to the ED for complaints of Seizures Currently, the patient is waiting for placement.  Physical Exam  BP 131/84 (BP Location: Right Arm)   Pulse 96   Temp 97.8 F (36.6 C) (Oral)   Resp 18   Ht 1.88 m (6\' 2" )   Wt 91 kg   SpO2 100%   BMI 25.76 kg/m  Physical Exam General: Resting Cardiac: Regular rate Lungs: Normal effort no stridor Psych: Deferred  ED Course / MDM  EKG:EKG Interpretation  Date/Time:  Monday January 05 2021 01:56:14 EDT Ventricular Rate:  124 PR Interval:  138 QRS Duration: 74 QT Interval:  302 QTC Calculation: 433 R Axis:   88 Text Interpretation: Sinus tachycardia Otherwise normal ECG Confirmed by 11-30-1999 [phone]) on 01/05/2021 4:06:29 AM  I have reviewed the labs performed to date as well as medications administered while in observation.  Recent changes in the last 24 hours include no acute changes overnight.  Plan  Current plan is for group home placement.  Paul Hendricks is not under involuntary commitment.     Paul Pastures, MD 04/26/21 1124

## 2021-04-27 DIAGNOSIS — G40909 Epilepsy, unspecified, not intractable, without status epilepticus: Secondary | ICD-10-CM | POA: Diagnosis not present

## 2021-04-27 NOTE — Progress Notes (Signed)
CSW received e-mail from CC [name] worthy in reference to placement.    This message was sent securely using Zix    *Caution - External email - see footer for warnings* Great afternoon Team Sho;  CC, QP and Director of [facility], and Social Worker and Nurse from [facility] met on Friday to discuss Masyn and to meet Elizabeth virtually (he was asleep and had to be woken up to meet us).  Tannar's application was completed and submitted for review.  What needs to happen right now is to schedule a virtual isp meeting to develop Manan's isp update for services in 05/04/21 to 05/23/21 and to develop Samrat's initial isp for services effective 05/24/21 to 05/23/22.  Team Mahin needs to meet asap to get things started.  I would like to know if members of Team Ernesto are available to meet virtual on tomorrow, Tuesday, 04/28/21 at 3:00pm.    The phone number to call is [phone].  Enter [phone]#, then enter [phone]# this will allow you to be in the virtual telephone conference.  I will email you a draft copy of Jayln's isp update and initial isp (since we have to submit a new isp for his annual) either tonight or in the morning.  If you have any questions or concerns, please feel free to contact me at the numbers below.  Looking forward to speaking to all of you tomorrow at 3pm.  Have a great rest of your afternoon.    PS.  I plan to submit [facility]'s request for enhanced rate for Residential Level 4 and Day Support, Individual Services tomorrow once I return to the office.  Thanks in advance for all the efforts and support Team Kip has provided.  This has been a long drawn out process but this time it really appears that we have found a home for Jamyron.    Thanks!  [name], BSW, QP  Sent from [employer]  CONFIDENTIALITY NOTICE: This message and any attachments included are from [facility] and are for sole use by the intended recipient(s). The  information contained herein may include confidential or privileged information. Unauthorized review, forwarding, printing, copying, distributing, or using such information is strictly prohibited and may be unlawful. If you received this message in error, or have reason to believe you are not authorized to receive it, please contact the sender by reply email and destroy all copies of the original message. ** Please be advised that any e-mail sent to and from this e-mail account is subject to the [place] Public Records Law and may be disclosed to third parties. ** To report fraud, waste and abuse, call the Medicaid tip-line at1-877-DMA-TIP1 (1-877-362-8471). Your call will remain anonymous.  WARNING: This email originated outside of [place]. Even if this looks like a [place] email, it is not. Do not provide your username, password, or any other personal information in response to this or any other email. [facility] will never ask you for your username or password via email. DO NOT CLICK links or attachments unless you are positive the content is safe. If in doubt about the safety of this message, select the Cof[employer] Report Phishing button, which forwards to IT Security.     This message was secured by Zix.    

## 2021-04-27 NOTE — Progress Notes (Signed)
CSW attempted to contact CC [name] [name] for update , no response unable to leave VM, VM not set up.    [name].Trentyn Boisclair, [name], LCSWA [name]  Transitions of Care Clinical Social Worker I Direct Dial: [phone]  Fax: [phone] [name]

## 2021-04-27 NOTE — ED Notes (Signed)
Pt has been cooperative and redirectable from 7:10 pm to the present. Pt awake from 1:00 am to present. During this time pt had 1 bowel movement, showered, made consistently loud vocalizations, paced around his room, and sat up in his bed.

## 2021-04-27 NOTE — ED Provider Notes (Signed)
Emergency Medicine Observation Re-evaluation Note  Paul Hendricks is a 30 y.o. male, seen on rounds today.  Pt initially presented to the ED for complaints of Seizures Currently, the patient is alert, patient is unable to verbally communicate.  Does sit up in bed.  Somewhat anxious when I attempted to listen to his heart and lungs.  Physical Exam  BP 131/84 (BP Location: Right Arm)   Pulse 96   Temp 97.8 F (36.6 C) (Oral)   Resp 18   Ht 1.88 m (6\' 2" )   Wt 91 kg   SpO2 100%   BMI 25.76 kg/m  Physical Exam General: Alert, nonverbal but making sounds Cardiac: Regular rate Lungs: Wheezing easily Psych: At baseline  ED Course / MDM  EKG:EKG Interpretation  Date/Time:  Monday January 05 2021 01:56:14 EDT Ventricular Rate:  124 PR Interval:  138 QRS Duration: 74 QT Interval:  302 QTC Calculation: 433 R Axis:   88 Text Interpretation: Sinus tachycardia Otherwise normal ECG Confirmed by 11-30-1999 [phone]) on 01/05/2021 4:06:29 AM  I have reviewed the labs performed to date as well as medications administered while in observation.  Recent changes in the last 24 hours include no acute changes.  Plan  Current plan is for group home placement at this time the patient has been here for over 186 days.  Paul Hendricks is not under involuntary commitment.     Paul Pastures, MD 04/27/21 [phone]

## 2021-04-28 ENCOUNTER — Emergency Department (HOSPITAL_COMMUNITY): Payer: Medicaid Other

## 2021-04-28 DIAGNOSIS — G40909 Epilepsy, unspecified, not intractable, without status epilepticus: Secondary | ICD-10-CM | POA: Diagnosis not present

## 2021-04-28 MED ORDER — ZIPRASIDONE MESYLATE 20 MG IM SOLR
20.0000 mg | Freq: Once | INTRAMUSCULAR | Status: AC
  Administered 2021-04-28: 20 mg via INTRAMUSCULAR
  Filled 2021-04-28: qty 20

## 2021-04-28 MED ORDER — MIDAZOLAM HCL 2 MG/2ML IJ SOLN
4.0000 mg | Freq: Once | INTRAMUSCULAR | Status: AC
  Administered 2021-04-28: 4 mg via INTRAMUSCULAR
  Filled 2021-04-28: qty 4

## 2021-04-28 MED ORDER — STERILE WATER FOR INJECTION IJ SOLN
INTRAMUSCULAR | Status: AC
  Filled 2021-04-28: qty 10

## 2021-04-28 MED ORDER — OLANZAPINE 10 MG IM SOLR
10.0000 mg | Freq: Once | INTRAMUSCULAR | Status: AC
  Administered 2021-04-28: 10 mg via INTRAMUSCULAR
  Filled 2021-04-28: qty 10

## 2021-04-28 NOTE — ED Notes (Signed)
Pt still too agitated to transport to CT. Will continue to monitor

## 2021-04-28 NOTE — ED Notes (Signed)
Loud noise heard from nursing station, entered room to find pt actively having a seizure.  Seizure activity lasted approximately 30-45 seconds.  Staff supported pt while this Clinical research associate obtained and administered IM ativan.  Pt has an abrasion to the right side of his face.  Pt noted to be post ictal.  VS revealed hypertension, O2 of 94% on RA.  Dr. Adela Lank notified.  Pt resting in bed at this time. Will continue to monitor.

## 2021-04-28 NOTE — ED Notes (Signed)
Pt unable to go to CT d/t agitation.  Dr. Adela Lank ordered 4 mg versed, 20 mg geodon IM to alleviate agitation.  Pt transferred to RM 25 to be monitored closely.  Pt remains agitated at this time. Security at bedside to assist keeping monitoring equipment on.

## 2021-04-28 NOTE — ED Provider Notes (Signed)
30 year old male is seen.  0 700.  Was notified by nursing that he had had a seizure and struck his head.  No obvious change in mentation.  We will obtain a CT scan.  Difficult to have calm for CT, multiple meds given.   CT negative.     Melene Plan, DO 04/29/21 [phone]

## 2021-04-28 NOTE — ED Provider Notes (Signed)
Emergency Medicine Observation Re-evaluation Note  Paul Hendricks is a 30 y.o. male, seen on rounds today.  Pt initially presented to the ED for complaints of Seizures Currently, the patient is alert, patient is unable to verbally communicate.  Does sit up in bed.  Somewhat anxious when I attempted to listen to his heart and lungs.  Physical Exam  BP (!) 151/118 (BP Location: Left Arm)   Pulse (!) 114   Temp 97.6 F (36.4 C) (Axillary)   Resp (!) 22   Ht 6\' 2"  (1.88 m)   Wt 91 kg   SpO2 98%   BMI 25.76 kg/m  Physical Exam General: Alert, nonverbal but making sounds Cardiac: Regular rate Lungs: Wheezing easily Psych: At baseline  ED Course / MDM  EKG:EKG Interpretation  Date/Time:  Monday January 05 2021 01:56:14 EDT Ventricular Rate:  124 PR Interval:  138 QRS Duration: 74 QT Interval:  302 QTC Calculation: 433 R Axis:   88 Text Interpretation: Sinus tachycardia Otherwise normal ECG Confirmed by 11-30-1999 [phone]) on 01/05/2021 4:06:29 AM  I have reviewed the labs performed to date as well as medications administered while in observation.  Recent changes in the last 24 hours include no acute changes.  Plan  Current plan is for group home placement at this time the patient has been here for over 186 days.  Paul Hendricks is not under involuntary commitment.      [name], DO 04/28/21 [phone]

## 2021-04-28 NOTE — Progress Notes (Signed)
CM participated in patient's ISP meeting to coordinate discharge planning to [facility].  ISP reviewed and plan for [facility] to submit the documentation.  Per CC IDD director reviewing request for an enhanced rate for [facility], decision pending at this time.  CC states health and safety has already been completed for the site.  At this stage, we are awaiting final approval of enhanced rate and ISP.  Tentative plan for discharge is 05/04/2021.  Patient will need an updated Covid test within 48 hours of discharge. 30 days supply of all medication will need to be sent to [facility] [address] [place] [address] [phone].  [facility] and CC made aware patient has a negative Quantiferon gold test from 04/21/2021.  [facility] is requesting hospital assistance with transportation day of discharge.

## 2021-04-28 NOTE — ED Notes (Signed)
Pt has been calm, cooperative, and easily redirectable from 12/05 at 7:05 pm to the present.

## 2021-04-29 DIAGNOSIS — G40909 Epilepsy, unspecified, not intractable, without status epilepticus: Secondary | ICD-10-CM | POA: Diagnosis not present

## 2021-04-29 NOTE — ED Notes (Signed)
Pt sleeping. Attempted to take temperature orally and axillary three times. Each time, pt pushed away my hand and wrapped himself tightly inside his blanket.

## 2021-04-29 NOTE — ED Notes (Signed)
Pt woke up, urinated on the floor, removed his clothes, and began making loud verbalizations. Transferring him the shower and room 33.

## 2021-04-29 NOTE — ED Provider Notes (Signed)
Emergency Medicine Observation Re-evaluation Note  Warden Buffa is a 30 y.o. male, seen on rounds today.  Pt initially presented to the ED for complaints of Seizures Currently, the patient is awaiting group home placement.  Physical Exam  BP 119/75 (BP Location: Left Arm)   Pulse 99   Temp 97.6 F (36.4 C) (Axillary)   Resp 19   Ht 1.88 m (6\' 2" )   Wt 91 kg   SpO2 95%   BMI 25.76 kg/m  Physical Exam General: No acute distress Cardiac:  Lungs: No respiratory distress Psych: Baseline  ED Course / MDM  EKG:EKG Interpretation  Date/Time:  Monday January 05 2021 01:56:14 EDT Ventricular Rate:  124 PR Interval:  138 QRS Duration: 74 QT Interval:  302 QTC Calculation: 433 R Axis:   88 Text Interpretation: Sinus tachycardia Otherwise normal ECG Confirmed by [birth year]-30[birth year]1999 [phone]) on 01/05/2021 4:06:29 AM  I have reviewed the labs performed to date as well as medications administered while in observation.  Recent changes in the last 24 hours include patient had what was thought to be seizure activity yesterday had follow-up CT head which was negative.  Also case management participated in patient's ISP meeting to coordinate discharge planning to [facility] 01[facility] has been reviewed and plan for [facility] to submit the documentation.  They are still looking for awaiting for group home placement.  It appears that they are working towards admission to [facility].  Plan  Current plan is for placement to [facility].  Moishy Laday is not under involuntary commitment.     [name], MD 04/29/21 1006

## 2021-04-29 NOTE — ED Notes (Signed)
Attempted to take pt vitals multiple times. Pt pushed [place] away each time and wrapped himself tightly in his blanket. Finally, pt began biting his hand. Unable to obtain pt vitals.

## 2021-04-30 DIAGNOSIS — G40909 Epilepsy, unspecified, not intractable, without status epilepticus: Secondary | ICD-10-CM | POA: Diagnosis not present

## 2021-04-30 NOTE — Progress Notes (Addendum)
CSW spoke with transportation services to inquire about transportation upon pt's d/c . Pt will need safe transport and an additional sitter in back seat. Pt's ride can not be arranged in advance, also the hospital will need to provided pt with a sitter , as transportation do not offer this service.   [name].Alaysiah Browder, [name], LCSWA [name]  Transitions of Care Clinical Social Worker I Direct Dial: [phone]  Fax: [phone] [name]

## 2021-04-30 NOTE — ED Provider Notes (Signed)
  Physical Exam  BP (!) 128/98 (BP Location: Left Arm)   Pulse 85   Temp (!) 97.5 F (36.4 C) (Oral)   Resp 20   Ht 6\' 2"  (1.88 m)   Wt 91 kg   SpO2 98%   BMI 25.76 kg/m   Physical Exam  ED Course/Procedures     Procedures  MDM  Pt has been accepted for inpatient treatment. Care transferred as making plans for transfer.         , MD 04/30/21 2226

## 2021-04-30 NOTE — Progress Notes (Incomplete)
CSW spoke with Cira Servant from [facility] to discuss d/c plans.

## 2021-04-30 NOTE — ED Notes (Signed)
Patient yelling and passing.  Upset with interaction. Difficult to redirect at this time.  Patient refused AM medications.  PRNs given (see MAR).

## 2021-05-01 DIAGNOSIS — G40909 Epilepsy, unspecified, not intractable, without status epilepticus: Secondary | ICD-10-CM | POA: Diagnosis not present

## 2021-05-01 MED ORDER — GUANFACINE HCL 2 MG PO TABS: 2.0000 mg | ORAL_TABLET | Freq: Every day | ORAL | 2 refills | Status: AC

## 2021-05-01 MED ORDER — OMEGA-3-ACID ETHYL ESTERS 1 G PO CAPS: 1.0000 g | ORAL_CAPSULE | Freq: Every day | ORAL | 2 refills | Status: AC

## 2021-05-01 MED ORDER — DIVALPROEX SODIUM 125 MG PO CSDR: 1000.0000 mg | DELAYED_RELEASE_CAPSULE | Freq: Two times a day (BID) | ORAL | 2 refills | Status: AC

## 2021-05-01 MED ORDER — TOPIRAMATE 25 MG PO TABS: 25.0000 mg | ORAL_TABLET | Freq: Two times a day (BID) | ORAL | 2 refills | Status: AC

## 2021-05-01 MED ORDER — CHLORPROMAZINE HCL 50 MG PO TABS
50.0000 mg | ORAL_TABLET | Freq: Three times a day (TID) | ORAL | 2 refills | Status: AC
Start: 2021-05-01 — End: ?

## 2021-05-01 MED ORDER — POLYETHYLENE GLYCOL 3350 17 G PO PACK: 17.0000 g | PACK | Freq: Two times a day (BID) | ORAL | 2 refills | Status: AC

## 2021-05-01 MED ORDER — MIRTAZAPINE 15 MG PO TABS: 15.0000 mg | ORAL_TABLET | Freq: Every day | ORAL | 2 refills | Status: AC

## 2021-05-01 MED ORDER — FUROSEMIDE 20 MG PO TABS: 20.0000 mg | ORAL_TABLET | Freq: Every day | ORAL | 2 refills | Status: AC

## 2021-05-01 MED ORDER — CLONAZEPAM 1 MG PO TABS: 1.0000 mg | ORAL_TABLET | Freq: Three times a day (TID) | ORAL | 2 refills | Status: AC | PRN

## 2021-05-01 MED ORDER — RISPERIDONE 1 MG PO TBDP: 1.0000 mg | ORAL_TABLET | Freq: Two times a day (BID) | ORAL | 2 refills | Status: AC

## 2021-05-01 MED ORDER — VITAMIN D3 25 MCG PO TABS: 1000.0000 [IU] | ORAL_TABLET | Freq: Every day | ORAL | 2 refills | Status: AC

## 2021-05-01 MED ORDER — DIPHENHYDRAMINE HCL 25 MG PO CAPS: 25.0000 mg | ORAL_CAPSULE | Freq: Two times a day (BID) | ORAL | 2 refills | Status: AC

## 2021-05-01 MED ORDER — MELATONIN 10 MG PO TABS: 10.0000 mg | ORAL_TABLET | Freq: Every day | ORAL | 2 refills | Status: AC

## 2021-05-01 MED ORDER — OLANZAPINE 10 MG PO TBDP: 10.0000 mg | ORAL_TABLET | Freq: Two times a day (BID) | ORAL | 2 refills | Status: AC | PRN

## 2021-05-01 MED ORDER — NALTREXONE HCL 50 MG PO TABS: 25.0000 mg | ORAL_TABLET | Freq: Two times a day (BID) | ORAL | 2 refills | Status: AC

## 2021-05-01 NOTE — ED Notes (Signed)
Transfer that was scheduled for Monday 05/04/21 possible delayed d/t paper work. [facility] paper work currently completed and in patients chart.

## 2021-05-01 NOTE — ED Provider Notes (Signed)
Emergency Medicine Observation Re-evaluation Note  Paul Hendricks is a 29 y.o. male, seen on rounds today.  Pt initially presented to the ED for complaints of Seizures Currently, the patient is awaiting group home placement.  Patient has slept much of the night.  He is now waking up in the afternoon.  He is sitting up at the bedside and making vocalizations and hand gestures.  Physical Exam  BP (!) 128/98 (BP Location: Left Arm)   Pulse 85   Temp (!) 97.5 F (36.4 C) (Oral)   Resp 20   Ht 6\' 2"  (1.88 m)   Wt 91 kg   SpO2 98%   BMI 25.76 kg/m  Physical Exam General: Patient is alert.  No respiratory distress.  Vocalizing. Cardiac: Regular.  No gross rub murmur gallop Lungs: No respiratory distress.  Clear Psych: Patient is awake.  He is doing some hand waving and vocalizing.  ED Course / MDM  EKG:EKG Interpretation  Date/Time:  Monday January 05 2021 01:56:14 EDT Ventricular Rate:  124 PR Interval:  138 QRS Duration: 74 QT Interval:  302 QTC Calculation: 433 R Axis:   88 Text Interpretation: Sinus tachycardia Otherwise normal ECG Confirmed by 11-30-1999 [phone]) on 01/05/2021 4:06:29 AM  I have reviewed the labs performed to date as well as medications administered while in observation.  Recent changes in the last 24 hours include none.  Plan  Current plan is for placement.  Paul Hendricks is not under involuntary commitment.     [name], MD 05/01/21 1453

## 2021-05-01 NOTE — ED Notes (Signed)
Pt has been calm, cooperative, and redirectable from 7:10 am to the present. During this time pt had 1 bowel movement, showered, paced around his room, and occasionally made loud vocalizations.

## 2021-05-01 NOTE — Progress Notes (Signed)
CSW received e-mail from pt's CC [name] worthy in regards to placement.   This message was sent securely using Zix    *Caution - External email - see footer for warnings* Great afternoon Team [place]; I am unable to submit Venson's isp update for Alroy to transition from [facility] to [facility], because I have yet to receive Alik's completed LOC form from his primary care physician. I asked [name] Social Worker to check to see if the ER Physician can complete it.  Tramond's services or enhanced rate of services cannot be submitted until the LOC has been completed and signed,  submitted LOC to UM and UM approves it.  So sorry for the inconvenience. I will email Team [place] with I receive the completed LOC, it's been submitted and approved by UM.  We are almost nearing the finish line for getting Avonte out of the ER and transitioned into [facility] providing Residential Level 4 and Day Support, Individual Services (both at enhanced rate).   If you have any questions, please feel free to contact me at the telephone numbers below.  Have a great evening and weekend.   Thanks! [name] [name], QP IDD Care Coordinator, [facility] [address] [place], [place]  [address] [phone] (fax) Monday, Wednesday, and Friday   8:30am to 5:00pm (working from home) Tuesday and Thursday   8:30am to 5:00pm  (in the office)   CONFIDENTIALITY NOTICE: This message and any attachments included are from [facility] and are for sole use by the intended recipient(s). The information contained herein may include confidential or privileged information. Unauthorized review, forwarding, printing, copying, distributing, or using such information is strictly prohibited and may be unlawful. If you received this message in error, or have reason to believe you are not authorized to receive it, please contact  the sender by reply email and destroy all copies of the original message. ** Please be advised that any e-mail sent to and from this e-mail account is subject to the [facility] Public Records Law and may be disclosed to third parties. ** To report fraud, waste and abuse, call the Medicaid tip-line at1-877-DMA-TIP1 ((210)769-1521). Your call will remain anonymous.  WARNING: This email originated outside of [facility]. Even if this looks like a [employer], it is not. Do not provide your username, password, or any other personal information in response to this or any other email. [facility] will never ask you for your username or password via email. DO NOT CLICK links or attachments unless you are positive the content is safe. If in doubt about the safety of this message, select the C[employer] Report Phishing button, which forwards to IT Security.  This message was secured by [employer]ix.    [name].Maytte Jacot, [name], LCSWA [name]  Transitions of Care Clinical Social Worker I Direct Dial: [phone]  Fax: [phone] [name]

## 2021-05-02 DIAGNOSIS — G40909 Epilepsy, unspecified, not intractable, without status epilepticus: Secondary | ICD-10-CM | POA: Diagnosis not present

## 2021-05-02 MED ORDER — LORAZEPAM 2 MG/ML IJ SOLN
2.0000 mg | Freq: Once | INTRAMUSCULAR | Status: AC
  Administered 2021-05-02: 2 mg via INTRAMUSCULAR
  Filled 2021-05-02: qty 1

## 2021-05-02 NOTE — Progress Notes (Signed)
CSW was informed about LOC paperwork that needs to bed signed and submitted to [facility] before pt can d/c on Monday. CSW made MD aware   CSW will contact [facility] [name] [name] on Monday to confirm she received the fax of the paperwork.  Pt more than likely will not d/c on 05/04/21. CSW will continue to follow.  [name].Fallyn Munnerlyn, [name], LCSWA [name]  Transitions of Care Clinical Social Worker I Direct Dial: [phone]  Fax: [phone] [name]

## 2021-05-02 NOTE — ED Notes (Signed)
Pt asleep at this time for dinner

## 2021-05-02 NOTE — ED Notes (Signed)
Paul Hendricks displaying signs of tooth pain after drinking cold liquid will apply oral jelly

## 2021-05-02 NOTE — Progress Notes (Signed)
Pt continues to have loud outbursts and beats his head and hands

## 2021-05-02 NOTE — ED Notes (Signed)
Patient refused to take shower tech ask pt x 2

## 2021-05-03 DIAGNOSIS — G40909 Epilepsy, unspecified, not intractable, without status epilepticus: Secondary | ICD-10-CM | POA: Diagnosis not present

## 2021-05-03 NOTE — ED Notes (Signed)
Patient has been sleeping this shift. Patient took AM medication. Patient ate breakfast and lunch. Patient resting comfortably. No s/s of distress.

## 2021-05-03 NOTE — ED Notes (Signed)
Patient needs all medications crushed and hidden in food. Very limited food items available to provide all medications. Patient was given 2 small snack cakes with crushed medications. Unable to hide all medications and there was no other food available

## 2021-05-03 NOTE — ED Notes (Addendum)
Patient was getting up hitting himself and tried walking out of his room. We got him to calm down and get back into bed. Patient is resting comfortably.

## 2021-05-04 DIAGNOSIS — G40909 Epilepsy, unspecified, not intractable, without status epilepticus: Secondary | ICD-10-CM | POA: Diagnosis not present

## 2021-05-04 NOTE — ED Notes (Signed)
Patient ended up eating some of his cake with medication in it. Unsure of the amount of medications ingested. Patient restless for most of shift. He was biting his hands and hit his thighs repeatedly. RN had concern that he would injury himself so IM medication was administered

## 2021-05-04 NOTE — Progress Notes (Signed)
CSW attempted to contact pt's CC [name] [name], no answer unable to Vm.    E-mail was sent out requesting update d/c date.  [name].Latham Kinzler, [name], LCSWA [name]  Transitions of Care Clinical Social Worker I Direct Dial: [phone]  Fax: [phone] [name]

## 2021-05-04 NOTE — ED Notes (Signed)
Pt. Is awake, eating his angel cake (meds), and used the bathroom.

## 2021-05-05 DIAGNOSIS — G40909 Epilepsy, unspecified, not intractable, without status epilepticus: Secondary | ICD-10-CM | POA: Diagnosis not present

## 2021-05-05 NOTE — ED Notes (Signed)
Patient alert this shift.  Patient has been sleeping most of shift.  Patient took medication this shift.  Patient ate all meals. No aggression noted.

## 2021-05-05 NOTE — Progress Notes (Signed)
Attempted to call CC [name] worthy no answer left HIPPA complaint VM.   CSW has not received updated d/c date.   Paul Hendricks.Eulises Kijowski, [name], LCSWA [name]   Transitions of Care Clinical Social Worker I Direct Dial: [phone]   Fax: [phone] [name]

## 2021-05-05 NOTE — ED Provider Notes (Signed)
Emergency Medicine Observation Re-evaluation Note  Roan Miklos is a 30 y.o. male, seen on rounds today.  Pt initially presented to the ED for complaints of Seizures Currently, the patient is asleep, resting comfortably.  Physical Exam  BP (!) 134/104 (BP Location: Right Arm)   Pulse (!) 119   Temp 98.2 F (36.8 C) (Axillary)   Resp (!) 22   Ht 6\' 2"  (1.88 m)   Wt 91 kg   SpO2 96%   BMI 25.76 kg/m  Physical Exam Vitals and nursing note reviewed.  Constitutional:      General: He is not in acute distress.    Appearance: He is well-developed.  HENT:     Head: Normocephalic and atraumatic.  Eyes:     Conjunctiva/sclera: Conjunctivae normal.  Cardiovascular:     Rate and Rhythm: Normal rate and regular rhythm.     Heart sounds: No murmur heard. Pulmonary:     Effort: Pulmonary effort is normal. No respiratory distress.  Musculoskeletal:        General: No swelling.     Cervical back: Neck supple.  Skin:    General: Skin is warm and dry.  Neurological:     Mental Status: He is alert.  Psychiatric:        Mood and Affect: Mood normal.     ED Course / MDM  EKG:EKG Interpretation  Date/Time:  Monday January 05 2021 01:56:14 EDT Ventricular Rate:  124 PR Interval:  138 QRS Duration: 74 QT Interval:  302 QTC Calculation: 433 R Axis:   88 Text Interpretation: Sinus tachycardia Otherwise normal ECG Confirmed by 11[birth year]30[birth year]1999 [phone]) on 01/05/2021 4:06:29 AM  I have reviewed the labs performed to date as well as medications administered while in observation.  Recent changes in the last 24 hours include pending psych, social work.  Plan  Current plan is pending psych and social work.  Ceylon Arenson is not under involuntary commitment.     [name], MD 05/05/21 [phone]

## 2021-05-06 DIAGNOSIS — G40909 Epilepsy, unspecified, not intractable, without status epilepticus: Secondary | ICD-10-CM | POA: Diagnosis not present

## 2021-05-06 NOTE — ED Notes (Signed)
Medication prepared and set on bedside table.  Pt resting comfortably at this time.  Will encourage pt to take medication once awake.

## 2021-05-06 NOTE — ED Provider Notes (Signed)
Emergency Medicine Observation Re-evaluation Note  Paul Hendricks is a 30 y.o. male, seen on rounds today.  Pt initially presented to the ED for complaints of Seizures Currently, the patient is group home placement.  Physical Exam  BP (!) 144/100 (BP Location: Left Arm)    Pulse (!) 128    Temp 98.2 F (36.8 C) (Axillary)    Resp 20    Ht 1.88 m (6\' 2" )    Wt 91 kg    SpO2 94%    BMI 25.76 kg/m  Physical Exam General: wdwn Cardiac: tachy with agitation- reportedly normal when medicated and resting Lungs: no distress Psych: singing and yelling  ED Course / MDM  EKG:EKG Interpretation  Date/Time:  Monday January 05 2021 01:56:14 EDT Ventricular Rate:  124 PR Interval:  138 QRS Duration: 74 QT Interval:  302 QTC Calculation: 433 R Axis:   88 Text Interpretation: Sinus tachycardia Otherwise normal ECG Confirmed by 11-30-1999 [phone]) on 01/05/2021 4:06:29 AM  I have reviewed the labs performed to date as well as medications administered while in observation.  Recent changes in the last 24 hours include none.  Plan  Current plan is for group home placement.  Paul Hendricks is not under involuntary commitment.     [name], MD 05/06/21 [phone]

## 2021-05-07 DIAGNOSIS — G40909 Epilepsy, unspecified, not intractable, without status epilepticus: Secondary | ICD-10-CM | POA: Diagnosis not present

## 2021-05-07 NOTE — Progress Notes (Signed)
Pt's ISP and LOC documents will be submitted today. She stated she will follow up with CSW tomorrow, with expected d/c date.  [name].Jarrah Seher, [name], LCSWA [name]   Transitions of Care Clinical Social Worker I Direct Dial: [phone]   Fax: [phone] [name]

## 2021-05-07 NOTE — ED Notes (Signed)
Patient alert this shift. Patient took medication this shift. No aggression noted.

## 2021-05-08 DIAGNOSIS — G40909 Epilepsy, unspecified, not intractable, without status epilepticus: Secondary | ICD-10-CM | POA: Diagnosis not present

## 2021-05-08 NOTE — ED Notes (Signed)
Pt has been calm, cooperative, and redirectable from 7:05 am to the present.

## 2021-05-08 NOTE — Progress Notes (Addendum)
CSW received a call from CC [employer], she reported pt's LOC was approved. Pt will d/c 05/11/21 to [facility] [place], Pt will need COVID test 48hrs prior to d/c and transportation to [name].   3:45pm CSW emailed pt's medication list to [facility]. Transportation will arrange ride for pt on 05/11/21    [name].Bo Teicher, [name], LCSWA [name]   Transitions of Care Clinical Social Worker I Direct Dial: [phone]   Fax: [phone] [name]

## 2021-05-08 NOTE — Progress Notes (Signed)
CSW received email from CC [name] [name] in regards to placement.    This message was sent securely using [employer]    *Caution - External email - see footer for warnings* Great afternoon Team [place]; Attached is a copy of Paul Hendricks's approved LOC and a draft isp update for Paul Hendricks's services from 05-07-21 to 05-23-21.  I will be sending a copy of Paul Hendricks's annual (initial)isp once the isp update has been approved.  I would like to schedule a treatment team telephone conference meeting on Tuesday, 05/12/21, 11am or 3pm to discuss Paul Hendricks's first night at [facility], [facility] [place] and to finish Paul Hendricks's annual(initial)isp effective 05/24/21 to 05/23/22.  Please review the attached isp update, sign signature page(s) and email back to me asap, the signed signature page(s). I   would like to submit Paul Hendricks's isp update before the end of the day tomorrow.  Have a great evening. PS:WE ARE ALMOST AT THE FINISH LINE.  HAND IN THERE.  THANKS FOR YOUR ASSISTANCE AND PATIENCE.  Thanks!  [name] [name], QP IDD Care Coordinator, [facility] [address] [place], [place]  [address] [phone] (office)      [phone] (cell)        [phone] (fax) Monday, Wednesday, and Friday   8:30am to 5:00pm (working from home) Tuesday and Thursday   8:30am to 5:00pm  (in the office)  CONFIDENTIALITY NOTICE: This message and any attachments included are from [facility] and are for sole use by the intended recipient(s). The information contained herein may include confidential or privileged information. Unauthorized review, forwarding, printing, copying, distributing, or using such information is strictly prohibited and may be unlawful. If you received this message in error, or have reason to believe you are not authorized to receive it, please contact the sender by reply email and destroy all copies of the original message. *Please be advised that any e-mail sent to and from this  e-mail account is subject to the [facility] Public Records Law and may be disclosed to third parties. *To report fraud, waste and abuse, call the Medicaid tip-line at1-877-DMA-TIP1 (3526370868). Your call will remain anonymous.  WARNING: This email originated outside of [facility]. Even if this looks like a [employer], it is not. Do not provide your username, password, or any other personal information in response to this or any other email. [facility] will never ask you for your username or password via email. DO NOT CLICK links or attachments unless you are positive the content is safe. If in doubt about the safety of this message, select the [employer] Report Phishing button, which forwards to IT Security.  This message was secured by [employer].

## 2021-05-09 DIAGNOSIS — G40909 Epilepsy, unspecified, not intractable, without status epilepticus: Secondary | ICD-10-CM | POA: Diagnosis not present

## 2021-05-09 NOTE — ED Notes (Signed)
Pt smiling, moving around in his bed, flapping his hands, and making loud vocalizations. Pt appears excited. Pt bumped the bedside table and the cran-grape drink mixed with miralax, olanzapine, and clonazepam fell on the floor. Pt did not drink any of the drink.

## 2021-05-09 NOTE — ED Notes (Addendum)
Patient drank juice and had small amount of emesis.

## 2021-05-09 NOTE — ED Notes (Signed)
Pt calmed down and laid down in bed.

## 2021-05-09 NOTE — ED Notes (Signed)
At 7:40 am, pt drank olanzapine, clonazepam, and miralax mixed in a cran-grape drink.  At 9:02 am, pt ate risperidone and diphenhydramine mixed in breakfast. At 9:55 am, pt had 2 episodes of emesis. Will attempted to administer morning medications again.

## 2021-05-09 NOTE — ED Notes (Signed)
NT slowly fed pt 8 saltine crackers. Pt took sips between crackers. Ten minutes later, pt had a episode of emesis. Cleaned up pt. Attempted to take pt vitals. Pt pulled off the BP cuff, pulse ox, and refused oral and axillary temperatures. Pt attempted to press his nails into our hands. He made loud vocalizations and began biting his hands.

## 2021-05-09 NOTE — ED Notes (Signed)
Writer placed meal tray in front of patient.  Patient picked up fork in an attempt to eat and immediately began having dry heaves.  Writer removed plate.  Patient pointed to his throat and said "It hurts."  Per report, patient also experienced 2 episodes of vomiting on day shift.  MD notified.

## 2021-05-10 DIAGNOSIS — G40909 Epilepsy, unspecified, not intractable, without status epilepticus: Secondary | ICD-10-CM | POA: Diagnosis not present

## 2021-05-10 LAB — RESP PANEL BY RT-PCR (FLU A&B, COVID) ARPGX2
Influenza A by PCR: NEGATIVE
Influenza B by PCR: NEGATIVE
SARS Coronavirus 2 by RT PCR: NEGATIVE

## 2021-05-10 MED ORDER — ONDANSETRON HCL 4 MG/2ML IJ SOLN
4.0000 mg | Freq: Once | INTRAMUSCULAR | Status: AC
  Administered 2021-05-10: 04:00:00 4 mg via INTRAMUSCULAR
  Filled 2021-05-10: qty 2

## 2021-05-10 NOTE — ED Notes (Signed)
Writer called lab to check results of COVID and flu swab.  Per lab, they are going to run test now and results should take 1 hour.

## 2021-05-10 NOTE — ED Provider Notes (Signed)
Emergency Medicine Observation Re-evaluation Note  Paul Hendricks is a 30 y.o. male, seen on rounds today.  Pt initially presented to the ED for complaints of Seizures Here pending placement. Has  been here 198 days. Currently, the patient is resting comfortably. Nursing staff reports pt vomited a couple of times yesterday. He has just finished a popsicle and is not vomiting. Abdomen is soft. Does not appear to react to pain. Will continue to monitor.  Physical Exam  BP (!) 159/113 (BP Location: Right Arm)    Pulse (!) 125    Temp 98.1 F (36.7 C) (Axillary)    Resp (!) 22    Ht 6\' 2"  (1.88 m)    Wt 91 kg    SpO2 95%    BMI 25.76 kg/m  Physical Exam General: Alert, Nonverbal at baseline Cardiac: RRR Lungs: LCTAB Abd: Soft and nontender  ED Course / MDM  EKG:EKG Interpretation  Date/Time:  Monday January 05 2021 01:56:14 EDT Ventricular Rate:  124 PR Interval:  138 QRS Duration: 74 QT Interval:  302 QTC Calculation: 433 R Axis:   88 Text Interpretation: Sinus tachycardia Otherwise normal ECG Confirmed by 11-30[birth year]1999 [phone]) on 01/05/2021 4:06:29 AM  I have reviewed the labs performed to date as well as medications administered while in observation.  Recent changes in the last 24 hours include N/A.  Plan  Current plan is for pending placement.  Bishoy Cupp is not under involuntary commitment.     [name], PA-C 05/10/21 05/12/21    [id number], MD 05/10/21 [phone]

## 2021-05-10 NOTE — ED Notes (Signed)
Attempted to get pt vitals, unsuccessful due to pt taking off bp cuff several times, Nurse is aware.

## 2021-05-10 NOTE — ED Notes (Addendum)
Patient started coughing and dry heaving when drinking juice.  EDP notified.

## 2021-05-10 NOTE — ED Notes (Signed)
Patient has been alert during shift. Patient mostly sleeping this shift.  Patient took medication this AM. Patient ate breakfast and had flavored ice this shift.

## 2021-05-11 DIAGNOSIS — G40909 Epilepsy, unspecified, not intractable, without status epilepticus: Secondary | ICD-10-CM | POA: Diagnosis not present

## 2021-05-11 MED ORDER — COVID-19 MRNA VAC-TRIS(PFIZER) 30 MCG/0.3ML IM SUSP
0.3000 mL | Freq: Once | INTRAMUSCULAR | Status: AC
  Administered 2021-05-11: 13:00:00 0.3 mL via INTRAMUSCULAR
  Filled 2021-05-11: qty 0.3

## 2021-05-11 NOTE — ED Provider Notes (Signed)
Emergency Medicine Observation Re-evaluation Note  Paul Hendricks is a 30 y.o. male, seen on rounds today.  Pt initially presented to the ED for complaints of Seizures Currently, the patient is resting comfortably and reported to be discharged today.  Physical Exam  BP (!) 159/113 (BP Location: Right Arm)    Pulse (!) 125    Temp (!) 94.1 F (34.5 C) (Oral) Comment: Pt. Refuse   Resp 20    Ht 1.88 m (6\' 2" )    Wt 91 kg    SpO2 95%    BMI 25.76 kg/m  Physical Exam General: wdwn Cardiac: tachy Lungs: rr normal sats 95% Psych: calm Last vital signs appear to have occurred while patient was agitated. He is calm and these will be rechecked. ED Course / MDM  EKG:EKG Interpretation  Date/Time:  Monday January 05 2021 01:56:14 EDT Ventricular Rate:  124 PR Interval:  138 QRS Duration: 74 QT Interval:  302 QTC Calculation: 433 R Axis:   88 Text Interpretation: Sinus tachycardia Otherwise normal ECG Confirmed by 11-30-1999 [phone]) on 01/05/2021 4:06:29 AM  I have reviewed the labs performed to date as well as medications administered while in observation.  Recent changes in the last 24 hours include placement today.  Plan  Current plan is for discharge.  Paul Hendricks is not under involuntary commitment.     [name], MD 05/11/21 [phone]

## 2021-05-11 NOTE — Progress Notes (Signed)
Pt to d/c today at 230pm.   [name].Gabryelle Whitmoyer, [name], LCSWA [name]   Transitions of Care Clinical Social Worker I Direct Dial: [phone]   Fax: [phone] [name]

## 2021-10-22 DEATH — deceased
